# Patient Record
Sex: Female | Born: 1950 | Race: White | Hispanic: No | Marital: Married | Smoking: Never smoker
Health system: Southern US, Community
[De-identification: ages and names within clinical notes are randomized; demographics above are authoritative.]

## PROBLEM LIST (undated history)

## (undated) DIAGNOSIS — J45909 Unspecified asthma, uncomplicated: Secondary | ICD-10-CM

## (undated) HISTORY — PX: DENTAL SURGERY: SHX609

---

## 2015-01-28 ENCOUNTER — Telehealth (INDEPENDENT_AMBULATORY_CARE_PROVIDER_SITE_OTHER): Payer: Self-pay | Admitting: *Deleted

## 2015-01-28 NOTE — Telephone Encounter (Signed)
error 

## 2015-02-20 ENCOUNTER — Ambulatory Visit (INDEPENDENT_AMBULATORY_CARE_PROVIDER_SITE_OTHER): Payer: Medicare Other | Admitting: Orthopedic Surgery

## 2015-02-20 ENCOUNTER — Ambulatory Visit (INDEPENDENT_AMBULATORY_CARE_PROVIDER_SITE_OTHER): Payer: Medicare Other

## 2015-02-20 VITALS — BP 153/83 | Ht 62.0 in | Wt 207.0 lb

## 2015-02-20 DIAGNOSIS — M1712 Unilateral primary osteoarthritis, left knee: Secondary | ICD-10-CM

## 2015-02-20 DIAGNOSIS — M7150 Other bursitis, not elsewhere classified, unspecified site: Secondary | ICD-10-CM

## 2015-02-20 DIAGNOSIS — M25562 Pain in left knee: Secondary | ICD-10-CM

## 2015-02-20 DIAGNOSIS — M7050 Other bursitis of knee, unspecified knee: Secondary | ICD-10-CM

## 2015-02-20 MED ORDER — DICLOFENAC POTASSIUM 50 MG PO TABS: 50.0000 mg | ORAL_TABLET | Freq: Two times a day (BID) | ORAL | Status: DC

## 2015-02-22 NOTE — Progress Notes (Signed)
New patient referred by Dr. W[facility] in [place] [facility] Complaint  Patient presents with  . Knee Pain    Left knee pain, no injury.     History the patient complains of pain in her left knee for several years worse over the last 3 months. She denies trauma. Symptoms pain swelling stiffness Pain described as sharp Timing constant Intensity 8 Testing none Treatment none Medication none Symptoms are improved by sitting in a hot tub of water Symptoms are worse first if standing for long periods of time  Review of systems self-reported by the patient neurologic symptoms she reports weakness allergy seasonal allergies musculoskeletal joint pain swollen joints 14 point system review revealed no abnormalities  Medical history none surgical history none medications none  Family history asthma emphysema alcoholism osteoporosis Allergies contrast dye Social history she does not smoke or drink and she is retired  Physical examination findings BP 153/83 mmHg  Ht 5\' 2"  (1.575 m)  Wt 207 lb (93.895 kg)  BMI 37.85 kg/m2 Appearance patient has mesomorphic body habitus but otherwise grooming hygiene normal no gross deformities  Wanted 3  Mood affect normal  Gait normal  Right knee tenderness and swelling over the bursitis which we diagnosed and palpated over the right has bursal tendons. No joint effusion. Flexion 115. Stability normal. Motor exam normal. Provocative tests for meniscal tear normal.  Left knee she has a flexion contracture 20 active and passive she has tenderness over the past bursal tendons as well. Swelling there. Medial joint line tenderness. McMurray sign negative. Stability and strength is normal.  Both lower extremities normal skin. Normal sensation. Normal pulses.  I ordered x-rays of her knee and we see that she has osteoarthritis especially on the medial compartment and it is greater than 50% loss of joint space approximate 75% joint loss of joint  space with varus and external rotation deformity  The patient has not been treated with nonoperative therapies to this point so we recommend weight loss, exercise, diclofenac 50 mg twice a day #62 refills    Recommend [facility] blend #2 with diclofenac 3% lidocaine 5% menthol 1% and baclofen 2%  Follow-up 3 months  Strong chance she will need knee replacement and this was discussed

## 2015-05-27 ENCOUNTER — Ambulatory Visit: Payer: Medicare Other | Admitting: Orthopedic Surgery

## 2015-08-26 ENCOUNTER — Ambulatory Visit (INDEPENDENT_AMBULATORY_CARE_PROVIDER_SITE_OTHER): Payer: Medicare Other | Admitting: Orthopedic Surgery

## 2015-08-26 VITALS — BP 154/84 | Ht 59.0 in | Wt 209.0 lb

## 2015-08-26 DIAGNOSIS — M1712 Unilateral primary osteoarthritis, left knee: Secondary | ICD-10-CM

## 2015-08-26 MED ORDER — DICLOFENAC POTASSIUM 50 MG PO TABS: 50.0000 mg | ORAL_TABLET | Freq: Two times a day (BID) | ORAL | Status: DC

## 2015-08-26 NOTE — Progress Notes (Signed)
Chief Complaint  Patient presents with  . Follow-up    3 month follow up left knee, discuss surgery    This is a 3 month follow-up visit patient has a history of osteoarthritis of the knee with pretty severe varus alignment. We treated her with weight loss exercise and diclofenac 50 mg twice a day but she gained about 7 or 8 pounds since I saw her in April. Review of systems denies catching locking giving way just the weight gain. She has mesomorphic body habitus with normal grooming and hygiene she is oriented to person place and time mood and affect are normal she has tenderness over the medial joint line of the knee with varus alignment her knee flexion on the left side ranges approximately 115 flexion contracture little bit better. McMurray sign negative muscle strength and tone normal neurovascular exam intact  Recommend refill diclofenac 50 twice a day that seem to be working until it ran out. She'll follow-up in March after she tries diet change weight loss exercise.  Encounter Diagnosis  Name Primary?  . Primary osteoarthritis of knee, left Yes

## 2015-12-10 ENCOUNTER — Encounter: Payer: Self-pay | Admitting: *Deleted

## 2015-12-25 ENCOUNTER — Ambulatory Visit: Payer: Medicare Other | Admitting: Orthopedic Surgery

## 2016-01-27 ENCOUNTER — Ambulatory Visit (INDEPENDENT_AMBULATORY_CARE_PROVIDER_SITE_OTHER): Payer: Medicare Other | Admitting: Internal Medicine

## 2016-02-09 ENCOUNTER — Ambulatory Visit: Payer: Medicare Other | Admitting: Orthopedic Surgery

## 2016-03-24 DIAGNOSIS — B351 Tinea unguium: Secondary | ICD-10-CM | POA: Diagnosis not present

## 2016-03-24 DIAGNOSIS — L820 Inflamed seborrheic keratosis: Secondary | ICD-10-CM | POA: Diagnosis not present

## 2016-03-24 DIAGNOSIS — D225 Melanocytic nevi of trunk: Secondary | ICD-10-CM | POA: Diagnosis not present

## 2016-03-24 DIAGNOSIS — Z1283 Encounter for screening for malignant neoplasm of skin: Secondary | ICD-10-CM | POA: Diagnosis not present

## 2016-04-07 ENCOUNTER — Ambulatory Visit: Payer: Medicare Other | Admitting: Orthopedic Surgery

## 2016-05-04 ENCOUNTER — Encounter: Payer: Self-pay | Admitting: Obstetrics & Gynecology

## 2016-05-04 ENCOUNTER — Ambulatory Visit (INDEPENDENT_AMBULATORY_CARE_PROVIDER_SITE_OTHER): Payer: Medicare Other | Admitting: Obstetrics & Gynecology

## 2016-05-04 ENCOUNTER — Other Ambulatory Visit (HOSPITAL_COMMUNITY)
Admission: RE | Admit: 2016-05-04 | Discharge: 2016-05-04 | Disposition: A | Discharge status: Home / Self Care | Payer: Medicare Other | Source: Ambulatory Visit | Attending: Obstetrics & Gynecology | Admitting: Obstetrics & Gynecology

## 2016-05-04 VITALS — BP 110/70 | HR 76 | Ht 62.0 in | Wt 206.0 lb

## 2016-05-04 DIAGNOSIS — Z124 Encounter for screening for malignant neoplasm of cervix: Secondary | ICD-10-CM

## 2016-05-04 DIAGNOSIS — Z1212 Encounter for screening for malignant neoplasm of rectum: Secondary | ICD-10-CM

## 2016-05-04 DIAGNOSIS — Z1211 Encounter for screening for malignant neoplasm of colon: Secondary | ICD-10-CM

## 2016-05-04 DIAGNOSIS — Z01419 Encounter for gynecological examination (general) (routine) without abnormal findings: Secondary | ICD-10-CM

## 2016-05-04 NOTE — Progress Notes (Signed)
Patient ID: Kelly Fox, female   DOB: 02-11-1951, 65 y.o.   MRN: [id number] Subjective:     Kelly Fox is a 65 y.o. female here for a routine exam.  No LMP recorded. Patient is postmenopausal. No obstetric history on file. Birth Control Method:  none Menstrual Calendar(currently): none  Current complaints: none.   Current acute medical issues:  none   Recent Gynecologic History No LMP recorded. Patient is postmenopausal. Last Pap: 18 years ago,  normal Last mammogram: ,    History reviewed. No pertinent past medical history.  History reviewed. No pertinent past surgical history.  OB History    No data available      Social History   Social History  . Marital Status: Married    Spouse Name: N/A  . Number of Children: N/A  . Years of Education: N/A   Social History Main Topics  . Smoking status: Never Smoker   . Smokeless tobacco: None  . Alcohol Use: None  . Drug Use: None  . Sexual Activity: Not Asked   Other Topics Concern  . None   Social History Narrative  . None    History reviewed. No pertinent family history.  No current outpatient prescriptions on file.  Review of Systems  Review of Systems  Constitutional: Negative for fever, chills, weight loss, malaise/fatigue and diaphoresis.  HENT: Negative for hearing loss, ear pain, nosebleeds, congestion, sore throat, neck pain, tinnitus and ear discharge.   Eyes: Negative for blurred vision, double vision, photophobia, pain, discharge and redness.  Respiratory: Negative for cough, hemoptysis, sputum production, shortness of breath, wheezing and stridor.   Cardiovascular: Negative for chest pain, palpitations, orthopnea, claudication, leg swelling and PND.  Gastrointestinal: negative for abdominal pain. Negative for heartburn, nausea, vomiting, diarrhea, constipation, blood in stool and melena.  Genitourinary: Negative for dysuria, urgency, frequency, hematuria and flank pain.  Musculoskeletal: Negative  for myalgias, back pain, joint pain and falls.  Skin: Negative for itching and rash.  Neurological: Negative for dizziness, tingling, tremors, sensory change, speech change, focal weakness, seizures, loss of consciousness, weakness and headaches.  Endo/Heme/Allergies: Negative for environmental allergies and polydipsia. Does not bruise/bleed easily.  Psychiatric/Behavioral: Negative for depression, suicidal ideas, hallucinations, memory loss and substance abuse. The patient is not nervous/anxious and does not have insomnia.        Objective:  Blood pressure 110/70, pulse 76, height 5\' 2"  (1.575 m), weight 206 lb (93.441 kg).   Physical Exam  Vitals reviewed. Constitutional: She is oriented to person, place, and time. She appears well-developed and well-nourished.  HENT:  Head: Normocephalic and atraumatic.        Right Ear: External ear normal.  Left Ear: External ear normal.  Nose: Nose normal.  Mouth/Throat: Oropharynx is clear and moist.  Eyes: Conjunctivae and EOM are normal. Pupils are equal, round, and reactive to light. Right eye exhibits no discharge. Left eye exhibits no discharge. No scleral icterus.  Neck: Normal range of motion. Neck supple. No tracheal deviation present. No thyromegaly present.  Cardiovascular: Normal rate, regular rhythm, normal heart sounds and intact distal pulses.  Exam reveals no gallop and no friction rub.   No murmur heard. Respiratory: Effort normal and breath sounds normal. No respiratory distress. She has no wheezes. She has no rales. She exhibits no tenderness.  GI: Soft. Bowel sounds are normal. She exhibits no distension and no mass. There is no tenderness. There is no rebound and no guarding.  Genitourinary:  Breasts no masses skin changes  or nipple changes bilaterally      Vulva is normal without lesions Vagina is pink moist without discharge Cervix normal in appearance and pap is done Uterus is normal size shape and contour Adnexa is  negative with normal sized ovaries  {Rectal    hemoccult negative, normal tone, no masses  Musculoskeletal: Normal range of motion. She exhibits no edema and no tenderness.  Neurological: She is alert and oriented to person, place, and time. She has normal reflexes. She displays normal reflexes. No cranial nerve deficit. She exhibits normal muscle tone. Coordination normal.  Skin: Skin is warm and dry. No rash noted. No erythema. No pallor.  Psychiatric: She has a normal mood and affect. Her behavior is normal. Judgment and thought content normal.       Medications Ordered at today's visit: No orders of the defined types were placed in this encounter.    Other orders placed at today's visit: No orders of the defined types were placed in this encounter.      Assessment:    Healthy female exam.    Plan:    Mammogram ordered. Follow up in: 2 years.     Return in about 2 years (around 05/04/2018) for yearly, with Dr Elonda Husky.

## 2016-05-06 LAB — CYTOLOGY - PAP

## 2016-05-12 DIAGNOSIS — L02512 Cutaneous abscess of left hand: Secondary | ICD-10-CM | POA: Diagnosis not present

## 2016-05-12 DIAGNOSIS — L0390 Cellulitis, unspecified: Secondary | ICD-10-CM | POA: Diagnosis not present

## 2016-05-18 DIAGNOSIS — L02512 Cutaneous abscess of left hand: Secondary | ICD-10-CM | POA: Diagnosis not present

## 2016-05-18 DIAGNOSIS — I10 Essential (primary) hypertension: Secondary | ICD-10-CM | POA: Diagnosis not present

## 2016-05-28 DIAGNOSIS — E782 Mixed hyperlipidemia: Secondary | ICD-10-CM | POA: Diagnosis not present

## 2016-06-01 DIAGNOSIS — Z7251 High risk heterosexual behavior: Secondary | ICD-10-CM | POA: Diagnosis not present

## 2016-06-01 DIAGNOSIS — L02512 Cutaneous abscess of left hand: Secondary | ICD-10-CM | POA: Diagnosis not present

## 2016-06-02 ENCOUNTER — Ambulatory Visit (INDEPENDENT_AMBULATORY_CARE_PROVIDER_SITE_OTHER): Payer: Medicare Other

## 2016-06-02 ENCOUNTER — Encounter: Payer: Self-pay | Admitting: Orthopaedic Surgery

## 2016-06-02 ENCOUNTER — Ambulatory Visit (INDEPENDENT_AMBULATORY_CARE_PROVIDER_SITE_OTHER): Payer: Medicare Other | Admitting: Orthopaedic Surgery

## 2016-06-02 VITALS — BP 145/90 | HR 79 | Temp 97.9°F | Ht 60.0 in | Wt 205.6 lb

## 2016-06-02 DIAGNOSIS — M79645 Pain in left finger(s): Secondary | ICD-10-CM

## 2016-06-02 DIAGNOSIS — L089 Local infection of the skin and subcutaneous tissue, unspecified: Secondary | ICD-10-CM | POA: Diagnosis not present

## 2016-06-02 MED ORDER — DOXYCYCLINE HYCLATE 50 MG PO CAPS: ORAL_CAPSULE | ORAL | 1 refills | Status: DC

## 2016-06-02 NOTE — Progress Notes (Signed)
Subjective:  I have had an infection of my finger    Patient ID: Kelly Fox, female    DOB: [birth year], 65 y.o.   MRN: [id number]  HPI She had swelling and redness of the left long finger that was painful.  It happened after she had been out on their boat.  She went to [facility] in town here and was evaluated on July 14  She says the finger was lanced and she was begun on Septra DS which she took for two weeks.  I do not have any copies of those notes.  Then she went to see Dr. Merlyn Albert and was given doxycycline for the finger and took it for seven days.  She was referred here.  She had initially swelling of the left long finger and it had red streaks up the hand past the wrist to the elbow area.  That resolved in a day or two after the lancing of the finger.  Now she has just swelling of the finger to the PIP joint.  It is not as red as it was and has no drainage or fluctuance.  She is concerned it is still somewhat swollen and has a very slight reddish appearance.  She has no fever or chills.  Recent CBC was within normal ranges.   Review of Systems  HENT: Negative for congestion.   Respiratory: Negative for cough and shortness of breath.   Cardiovascular: Negative for chest pain and leg swelling.  Endocrine: Positive for cold intolerance.  Musculoskeletal: Positive for arthralgias.  Allergic/Immunologic: Positive for environmental allergies.   History reviewed. No pertinent past medical history.  History reviewed. No pertinent surgical history.  No current outpatient prescriptions on file prior to visit.   No current facility-administered medications on file prior to visit.     Social History   Social History  . Marital status: Married    Spouse name: N/A  . Number of children: N/A  . Years of education: N/A   Occupational History  . Not on file.   Social History Main Topics  . Smoking status: Never Smoker  . Smokeless tobacco: Never Used  . Alcohol  use Not on file  . Drug use: Unknown  . Sexual activity: Not on file   Other Topics Concern  . Not on file   Social History Narrative  . No narrative on file    History of hypertension and heart disease in the family.History also of COPD and osteoporosis.  BP (!) 145/90   Pulse 79   Temp 97.9 F (36.6 C)   Ht 5' (1.524 m)   Wt 205 lb 9.6 oz (93.3 kg)   BMI 40.15 kg/m      Objective:   Physical Exam  Constitutional: She is oriented to person, place, and time. She appears well-developed and well-nourished.  HENT:  Head: Normocephalic and atraumatic.  Eyes: Conjunctivae and EOM are normal. Pupils are equal, round, and reactive to light.  Neck: Normal range of motion. Neck supple.  Cardiovascular: Normal rate, regular rhythm and intact distal pulses.   Pulmonary/Chest: Effort normal.  Abdominal: Soft.  Musculoskeletal: She exhibits tenderness (The left long finger has dorsal swelling from the nail bed to the PIP joint, slightly red, no fluctuance, no drainage, ROM full, stable,  Rest of hand and the right hand normal.).  Neurological: She is alert and oriented to person, place, and time. She displays normal reflexes. No cranial nerve deficit. She exhibits normal muscle tone. Coordination  normal.  Skin: Skin is warm and dry.  Psychiatric: She has a normal mood and affect. Her behavior is normal. Judgment and thought content normal.    X-rays were done and reported separately.     Assessment & Plan:   Encounter Diagnoses  Name Primary?  . Finger pain, left Yes  . Finger infection    I will have her resume doxycyline for another ten days.  I will see her back in one week.  Call if any problem.  Precautions discussed.  Electronically Signed Sanjuana Kava, MD 8/9/201710:31 AM

## 2016-06-09 ENCOUNTER — Encounter: Payer: Self-pay | Admitting: Orthopaedic Surgery

## 2016-06-09 ENCOUNTER — Ambulatory Visit (INDEPENDENT_AMBULATORY_CARE_PROVIDER_SITE_OTHER): Payer: Medicare Other | Admitting: Orthopaedic Surgery

## 2016-06-09 VITALS — BP 149/83 | HR 83 | Temp 97.9°F | Ht 60.5 in | Wt 206.0 lb

## 2016-06-09 DIAGNOSIS — L089 Local infection of the skin and subcutaneous tissue, unspecified: Secondary | ICD-10-CM

## 2016-06-09 DIAGNOSIS — M79645 Pain in left finger(s): Secondary | ICD-10-CM

## 2016-06-09 MED ORDER — DOXYCYCLINE HYCLATE 50 MG PO CAPS: ORAL_CAPSULE | ORAL | 1 refills | Status: DC

## 2016-06-09 NOTE — Progress Notes (Signed)
Patient [id number] [name], female DOB:09-13-1951, 65 y.o. [id number]  Chief Complaint  Patient presents with  . Follow-up    left middle finger     HPI  Kelly Fox is a 65 y.o. female who has had redness and swelling of the left long finger near the DIP joint.  She has swelling still but it is less and it is less red.  She has been taking the doxycyline and I want her to continue this.  She has no new trauma.  She is moving the finger more. HPI  Body mass index is 39.57 kg/m.  ROS  Review of Systems  HENT: Negative for congestion.   Respiratory: Negative for cough and shortness of breath.   Cardiovascular: Negative for chest pain and leg swelling.  Endocrine: Positive for cold intolerance.  Musculoskeletal: Positive for arthralgias.  Allergic/Immunologic: Positive for environmental allergies.    No past medical history on file.  No past surgical history on file.  No family history on file.  Social History Social History  Substance Use Topics  . Smoking status: Never Smoker  . Smokeless tobacco: Never Used  . Alcohol use Not on file    Allergies  Allergen Reactions  . Contrast Media [Iodinated Diagnostic Agents]     Current Outpatient Prescriptions  Medication Sig Dispense Refill  . doxycycline (VIBRAMYCIN) 50 MG capsule Take two capsules twice a day.  Do not drink milk before or after taking. 40 capsule 1   No current facility-administered medications for this visit.      Physical Exam  Blood pressure (!) 149/83, pulse 83, temperature 97.9 F (36.6 C), height 5' 0.5" (1.537 m), weight 206 lb (93.4 kg).  Constitutional: overall normal hygiene, normal nutrition, well developed, normal grooming, normal body habitus. Assistive device:none  Musculoskeletal: gait and station Limp none, muscle tone and strength are normal, no tremors or atrophy is present.  .  Neurological: coordination overall normal.  Deep tendon reflex/nerve stretch intact.  Sensation  normal.  Cranial nerves II-XII intact.   Skin:   normal overall no scars, lesions, ulcers or rashes. No psoriasis.  Psychiatric: Alert and oriented x 3.  Recent memory intact, remote memory unclear.  Normal mood and affect. Well groomed.  Good eye contact.  Cardiovascular: overall no swelling, no varicosities, no edema bilaterally, normal temperatures of the legs and arms, no clubbing, cyanosis and good capillary refill.  Lymphatic: palpation is normal.  The left long finger has swelling from the PIP to the nail.  It is more dorsal.  There is slight redness but much less than before and the swelling is much less.  Motion at DIP is present today.  Sensation is intact.  There is no drainage or fluctuance.  She has much less pain to no pain.  The patient has been educated about the nature of the problem(s) and counseled on treatment options.  The patient appeared to understand what I have discussed and is in agreement with it.  Encounter Diagnoses  Name Primary?  . Finger pain, left Yes  . Finger infection     PLAN Call if any problems.  Precautions discussed.  Continue current medications.   Return to clinic 1 week   I renewed antibiotic.  Electronically Signed Sanjuana Kava, MD 8/16/20178:37 AM

## 2016-06-16 ENCOUNTER — Encounter: Payer: Self-pay | Admitting: Orthopaedic Surgery

## 2016-06-16 ENCOUNTER — Ambulatory Visit (INDEPENDENT_AMBULATORY_CARE_PROVIDER_SITE_OTHER): Payer: Medicare Other | Admitting: Orthopaedic Surgery

## 2016-06-16 VITALS — BP 141/76 | HR 73 | Temp 97.7°F | Ht 60.0 in | Wt 204.0 lb

## 2016-06-16 DIAGNOSIS — M79645 Pain in left finger(s): Secondary | ICD-10-CM

## 2016-06-16 NOTE — Progress Notes (Signed)
Patient [id number] Kelly Fox, female DOB:11-01-1950, 65 y.o. [id number]  Chief Complaint  Patient presents with  . Follow-up    left long finger swelling    HPI  Kelly Fox is a 65 y.o. female who has continued swelling of the dorsal DIP joint of the left long finger.  It is not red now, it is not tender but still has residual swelling.  She is completing the last course of doxycycline.  She is using it but still concerned about the residual swelling.  I will have hand surgeon see her for further evaluation.  HPI  Body mass index is 39.84 kg/m.  ROS  Review of Systems  HENT: Negative for congestion.   Respiratory: Negative for cough and shortness of breath.   Cardiovascular: Negative for chest pain and leg swelling.  Endocrine: Positive for cold intolerance.  Musculoskeletal: Positive for arthralgias.  Allergic/Immunologic: Positive for environmental allergies.    No past medical history on file.  No past surgical history on file.  No family history on file.  Social History Social History  Substance Use Topics  . Smoking status: Never Smoker  . Smokeless tobacco: Never Used  . Alcohol use Not on file    Allergies  Allergen Reactions  . Contrast Media [Iodinated Diagnostic Agents]     Current Outpatient Prescriptions  Medication Sig Dispense Refill  . doxycycline (VIBRAMYCIN) 50 MG capsule Take two capsules twice a day.  Do not drink milk before or after taking. 40 capsule 1   No current facility-administered medications for this visit.      Physical Exam  Blood pressure (!) 141/76, pulse 73, temperature 97.7 F (36.5 C), height 5' (1.524 m), weight 204 lb (92.5 kg).  Constitutional: overall normal hygiene, normal nutrition, well developed, normal grooming, normal body habitus. Assistive device:none  Musculoskeletal: gait and station Limp none, muscle tone and strength are normal, no tremors or atrophy is present.  .  Neurological: coordination  overall normal.  Deep tendon reflex/nerve stretch intact.  Sensation normal.  Cranial nerves II-XII intact.   Skin:   normal overall no scars, lesions, ulcers or rashes. No psoriasis.  Psychiatric: Alert and oriented x 3.  Recent memory intact, remote memory unclear.  Normal mood and affect. Well groomed.  Good eye contact.  Cardiovascular: overall no swelling, no varicosities, no edema bilaterally, normal temperatures of the legs and arms, no clubbing, cyanosis and good capillary refill.  Lymphatic: palpation is normal.  She has swelling over the dorsum of the DIP joint of the left long finger.  It is not red.  There is only very slight tenderness.  Nail looks OK.  There is no discharge or fluctuance.  Rest of fingers negative, right hand negative.  The patient has been educated about the nature of the problem(s) and counseled on treatment options.  The patient appeared to understand what I have discussed and is in agreement with it.  Encounter Diagnosis  Name Primary?  . Finger pain, left Yes    PLAN Call if any problems.  Precautions discussed.  Continue current medications.   Return to clinic to see hand surgeon   Electronically Signed Sanjuana Kava, MD 8/23/20178:47 AM

## 2016-06-23 DIAGNOSIS — M79645 Pain in left finger(s): Secondary | ICD-10-CM | POA: Diagnosis not present

## 2016-06-23 DIAGNOSIS — M1A0420 Idiopathic chronic gout, left hand, without tophus (tophi): Secondary | ICD-10-CM | POA: Diagnosis not present

## 2016-06-25 DIAGNOSIS — M1A0420 Idiopathic chronic gout, left hand, without tophus (tophi): Secondary | ICD-10-CM | POA: Diagnosis not present

## 2016-06-30 DIAGNOSIS — M11242 Other chondrocalcinosis, left hand: Secondary | ICD-10-CM | POA: Diagnosis not present

## 2016-07-01 ENCOUNTER — Other Ambulatory Visit (INDEPENDENT_AMBULATORY_CARE_PROVIDER_SITE_OTHER): Payer: Self-pay | Admitting: Internal Medicine

## 2016-07-01 DIAGNOSIS — K7682 Hepatic encephalopathy: Secondary | ICD-10-CM

## 2016-07-01 DIAGNOSIS — K7290 Hepatic failure, unspecified without coma: Secondary | ICD-10-CM

## 2016-09-10 DIAGNOSIS — Z6837 Body mass index (BMI) 37.0-37.9, adult: Secondary | ICD-10-CM | POA: Diagnosis not present

## 2016-09-10 DIAGNOSIS — K120 Recurrent oral aphthae: Secondary | ICD-10-CM | POA: Diagnosis not present

## 2016-10-09 DIAGNOSIS — M545 Low back pain: Secondary | ICD-10-CM | POA: Diagnosis not present

## 2016-10-09 DIAGNOSIS — M62830 Muscle spasm of back: Secondary | ICD-10-CM | POA: Diagnosis not present

## 2016-10-22 DIAGNOSIS — M79671 Pain in right foot: Secondary | ICD-10-CM | POA: Diagnosis not present

## 2016-10-22 DIAGNOSIS — M7741 Metatarsalgia, right foot: Secondary | ICD-10-CM | POA: Diagnosis not present

## 2016-10-22 DIAGNOSIS — B351 Tinea unguium: Secondary | ICD-10-CM | POA: Diagnosis not present

## 2016-10-22 DIAGNOSIS — L851 Acquired keratosis [keratoderma] palmaris et plantaris: Secondary | ICD-10-CM | POA: Diagnosis not present

## 2017-05-07 DIAGNOSIS — B029 Zoster without complications: Secondary | ICD-10-CM | POA: Diagnosis not present

## 2017-06-09 DIAGNOSIS — Z6835 Body mass index (BMI) 35.0-35.9, adult: Secondary | ICD-10-CM | POA: Diagnosis not present

## 2017-06-09 DIAGNOSIS — B029 Zoster without complications: Secondary | ICD-10-CM | POA: Diagnosis not present

## 2017-10-06 DIAGNOSIS — B029 Zoster without complications: Secondary | ICD-10-CM | POA: Diagnosis not present

## 2017-12-13 DIAGNOSIS — M170 Bilateral primary osteoarthritis of knee: Secondary | ICD-10-CM | POA: Diagnosis not present

## 2017-12-13 DIAGNOSIS — M1712 Unilateral primary osteoarthritis, left knee: Secondary | ICD-10-CM | POA: Diagnosis not present

## 2017-12-13 DIAGNOSIS — M25562 Pain in left knee: Secondary | ICD-10-CM | POA: Diagnosis not present

## 2017-12-13 DIAGNOSIS — M25561 Pain in right knee: Secondary | ICD-10-CM | POA: Diagnosis not present

## 2017-12-21 DIAGNOSIS — M1712 Unilateral primary osteoarthritis, left knee: Secondary | ICD-10-CM | POA: Diagnosis not present

## 2017-12-21 DIAGNOSIS — M25562 Pain in left knee: Secondary | ICD-10-CM | POA: Diagnosis not present

## 2017-12-27 DIAGNOSIS — M25562 Pain in left knee: Secondary | ICD-10-CM | POA: Diagnosis not present

## 2017-12-27 DIAGNOSIS — M1712 Unilateral primary osteoarthritis, left knee: Secondary | ICD-10-CM | POA: Diagnosis not present

## 2017-12-27 DIAGNOSIS — B351 Tinea unguium: Secondary | ICD-10-CM | POA: Diagnosis not present

## 2017-12-27 DIAGNOSIS — L851 Acquired keratosis [keratoderma] palmaris et plantaris: Secondary | ICD-10-CM | POA: Diagnosis not present

## 2018-01-05 DIAGNOSIS — M25562 Pain in left knee: Secondary | ICD-10-CM | POA: Diagnosis not present

## 2018-01-05 DIAGNOSIS — M1712 Unilateral primary osteoarthritis, left knee: Secondary | ICD-10-CM | POA: Diagnosis not present

## 2018-01-19 DIAGNOSIS — M25562 Pain in left knee: Secondary | ICD-10-CM | POA: Diagnosis not present

## 2018-01-19 DIAGNOSIS — M1712 Unilateral primary osteoarthritis, left knee: Secondary | ICD-10-CM | POA: Diagnosis not present

## 2018-04-25 DIAGNOSIS — B351 Tinea unguium: Secondary | ICD-10-CM | POA: Diagnosis not present

## 2018-04-25 DIAGNOSIS — M170 Bilateral primary osteoarthritis of knee: Secondary | ICD-10-CM | POA: Diagnosis not present

## 2018-04-25 DIAGNOSIS — M1712 Unilateral primary osteoarthritis, left knee: Secondary | ICD-10-CM | POA: Diagnosis not present

## 2018-04-25 DIAGNOSIS — M25562 Pain in left knee: Secondary | ICD-10-CM | POA: Diagnosis not present

## 2018-05-02 DIAGNOSIS — M1712 Unilateral primary osteoarthritis, left knee: Secondary | ICD-10-CM | POA: Diagnosis not present

## 2018-05-02 DIAGNOSIS — M25562 Pain in left knee: Secondary | ICD-10-CM | POA: Diagnosis not present

## 2018-05-16 DIAGNOSIS — M25562 Pain in left knee: Secondary | ICD-10-CM | POA: Diagnosis not present

## 2018-05-16 DIAGNOSIS — M1712 Unilateral primary osteoarthritis, left knee: Secondary | ICD-10-CM | POA: Diagnosis not present

## 2018-05-23 DIAGNOSIS — M1712 Unilateral primary osteoarthritis, left knee: Secondary | ICD-10-CM | POA: Diagnosis not present

## 2018-05-23 DIAGNOSIS — M25562 Pain in left knee: Secondary | ICD-10-CM | POA: Diagnosis not present

## 2018-08-29 ENCOUNTER — Ambulatory Visit (INDEPENDENT_AMBULATORY_CARE_PROVIDER_SITE_OTHER): Payer: Medicare Other

## 2018-08-29 ENCOUNTER — Ambulatory Visit (INDEPENDENT_AMBULATORY_CARE_PROVIDER_SITE_OTHER): Payer: Medicare Other | Admitting: Orthopaedic Surgery

## 2018-08-29 ENCOUNTER — Encounter: Payer: Self-pay | Admitting: Orthopaedic Surgery

## 2018-08-29 VITALS — BP 151/80 | HR 80 | Ht 60.0 in | Wt 207.0 lb

## 2018-08-29 DIAGNOSIS — M25572 Pain in left ankle and joints of left foot: Secondary | ICD-10-CM | POA: Diagnosis not present

## 2018-08-29 NOTE — Progress Notes (Signed)
Patient PX:Kelly Fox, female DOB:[birth year], 67 y.o. [id number]  Chief Complaint  Patient presents with  . Foot Pain    Left foot for 6-8 months. No known injury.    HPI  Kelly Fox is a 67 y.o. female who has pain of the left dorsal foot.  She has noticed a "knot" of the foot that has gotten more painful over the last six months.  She has pain with walking on unlevel ground.  She has also been seen at [facility] and has had injections in the left knee in July.  Her knee is no better.  She has no direct trauma, no redness, no swelling of the foot.   Body mass index is 40.43 kg/m.  The patient meets the AMA guidelines for Morbid (severe) obesity with a BMI > 40.0 and I have recommended weight loss.   ROS  Review of Systems  Constitutional: Positive for activity change.  Musculoskeletal: Positive for arthralgias, gait problem and joint swelling.  All other systems reviewed and are negative.   All other systems reviewed and are negative.  The following is a summary of the past history medically, past history surgically, known current medicines, social history and family history.  This information is gathered electronically by the computer from prior information and documentation.  I review this each visit and have found including this information at this point in the chart is beneficial and informative.    No past medical history on file.  No past surgical history on file.  No family history on file.  Social History Social History   Tobacco Use  . Smoking status: Never Smoker  . Smokeless tobacco: Never Used  Substance Use Topics  . Alcohol use: Not on file  . Drug use: Not on file    Allergies  Allergen Reactions  . Contrast Media [Iodinated Diagnostic Agents]     Current Outpatient Medications  Medication Sig Dispense Refill  . doxycycline (VIBRAMYCIN) 50 MG capsule Take two capsules twice a day.  Do not drink milk before or after taking. 40 capsule 1    No current facility-administered medications for this visit.      Physical Exam  Blood pressure (!) 151/80, pulse 80, height 5' (1.524 m), weight 207 lb (93.9 kg).  Constitutional: overall normal hygiene, normal nutrition, well developed, normal grooming, normal body habitus. Assistive device:none  Musculoskeletal: gait and station Limp left, muscle tone and strength are normal, no tremors or atrophy is present.  .  Neurological: coordination overall normal.  Deep tendon reflex/nerve stretch intact.  Sensation normal.  Cranial nerves II-XII intact.   Skin:   Normal overall no scars, lesions, ulcers or rashes. No psoriasis.  Psychiatric: Alert and oriented x 3.  Recent memory intact, remote memory unclear.  Normal mood and affect. Well groomed.  Good eye contact.  Cardiovascular: overall no swelling, no varicosities, no edema bilaterally, normal temperatures of the legs and arms, no clubbing, cyanosis and good capillary refill.  Lymphatic: palpation is normal.  Her mid left dorsal foot has an area of prominence from bossing of the mid tarsals.   She has no redness, no cyst, NV intact.  Limp to the left.    All other systems reviewed and are negative   The patient has been educated about the nature of the problem(s) and counseled on treatment options.  The patient appeared to understand what I have discussed and is in agreement with it.  Encounter Diagnosis  Name Primary?  . Pain of joint  of left ankle and foot Yes  x-rays were done of the left foot, reported separately.   PLAN Call if any problems.  Precautions discussed.  Continue current medications.   Return to clinic 1 month   Use BioFreeze or Aspercreme to the area tid.  Electronically Signed Sanjuana Kava, MD 11/5/20192:48 PM

## 2018-08-31 DIAGNOSIS — M1712 Unilateral primary osteoarthritis, left knee: Secondary | ICD-10-CM | POA: Diagnosis not present

## 2018-08-31 DIAGNOSIS — M25562 Pain in left knee: Secondary | ICD-10-CM | POA: Diagnosis not present

## 2018-09-01 DIAGNOSIS — R062 Wheezing: Secondary | ICD-10-CM | POA: Diagnosis not present

## 2018-09-01 DIAGNOSIS — R030 Elevated blood-pressure reading, without diagnosis of hypertension: Secondary | ICD-10-CM | POA: Diagnosis not present

## 2018-09-01 DIAGNOSIS — J069 Acute upper respiratory infection, unspecified: Secondary | ICD-10-CM | POA: Diagnosis not present

## 2018-09-06 ENCOUNTER — Other Ambulatory Visit (HOSPITAL_COMMUNITY): Payer: Self-pay | Admitting: Internal Medicine

## 2018-09-06 DIAGNOSIS — M1712 Unilateral primary osteoarthritis, left knee: Secondary | ICD-10-CM | POA: Diagnosis not present

## 2018-09-06 DIAGNOSIS — M25562 Pain in left knee: Secondary | ICD-10-CM | POA: Diagnosis not present

## 2018-09-06 DIAGNOSIS — Z1231 Encounter for screening mammogram for malignant neoplasm of breast: Secondary | ICD-10-CM

## 2018-09-19 DIAGNOSIS — M1712 Unilateral primary osteoarthritis, left knee: Secondary | ICD-10-CM | POA: Diagnosis not present

## 2018-09-19 DIAGNOSIS — M25562 Pain in left knee: Secondary | ICD-10-CM | POA: Diagnosis not present

## 2018-09-25 ENCOUNTER — Ambulatory Visit (HOSPITAL_COMMUNITY)
Admission: RE | Admit: 2018-09-25 | Discharge: 2018-09-25 | Disposition: A | Discharge status: Home / Self Care | Payer: Medicare Other | Source: Ambulatory Visit | Attending: Internal Medicine | Admitting: Internal Medicine

## 2018-09-25 ENCOUNTER — Encounter (HOSPITAL_COMMUNITY): Payer: Self-pay

## 2018-09-25 DIAGNOSIS — M1712 Unilateral primary osteoarthritis, left knee: Secondary | ICD-10-CM | POA: Diagnosis not present

## 2018-09-25 DIAGNOSIS — Z1231 Encounter for screening mammogram for malignant neoplasm of breast: Secondary | ICD-10-CM | POA: Diagnosis not present

## 2018-09-25 DIAGNOSIS — M25562 Pain in left knee: Secondary | ICD-10-CM | POA: Diagnosis not present

## 2018-09-26 ENCOUNTER — Encounter: Payer: Self-pay | Admitting: Orthopaedic Surgery

## 2018-09-26 ENCOUNTER — Ambulatory Visit (INDEPENDENT_AMBULATORY_CARE_PROVIDER_SITE_OTHER): Payer: Medicare Other | Admitting: Orthopaedic Surgery

## 2018-09-26 VITALS — BP 169/109 | HR 77 | Ht 60.0 in | Wt 195.0 lb

## 2018-09-26 DIAGNOSIS — M25572 Pain in left ankle and joints of left foot: Secondary | ICD-10-CM

## 2018-09-26 NOTE — Progress Notes (Signed)
Patient PO:Kelly Fox, female DOB:02-28-1951, 67 y.o. TIR:[id number]  Chief Complaint  Patient presents with  . Foot Pain    left    HPI  Kelly Fox is a 67 y.o. female who has left foot pain.  She is somewhat better with less pain but she still has pain at times.  The swelling is decreased.  She has no redness.  She has had a good holiday.  She has one more knee injection from [facility].   Body mass index is 38.08 kg/m.  She is now below BMI 40 and doing well.  ROS  Review of Systems  Constitutional: Positive for activity change.  Musculoskeletal: Positive for arthralgias, gait problem and joint swelling.  All other systems reviewed and are negative.   All other systems reviewed and are negative.  The following is a summary of the past history medically, past history surgically, known current medicines, social history and family history.  This information is gathered electronically by the computer from prior information and documentation.  I review this each visit and have found including this information at this point in the chart is beneficial and informative.    History reviewed. No pertinent past medical history.  History reviewed. No pertinent surgical history.  Family History  Problem Relation Age of Onset  . Breast cancer Sister     Social History Social History   Tobacco Use  . Smoking status: Never Smoker  . Smokeless tobacco: Never Used  Substance Use Topics  . Alcohol use: Not on file  . Drug use: Not on file    Allergies  Allergen Reactions  . Contrast Media [Iodinated Diagnostic Agents]     No current outpatient medications on file.   No current facility-administered medications for this visit.      Physical Exam  Blood pressure (!) 169/109, pulse 77, height 5' (1.524 m), weight 195 lb (88.5 kg).  Constitutional: overall normal hygiene, normal nutrition, well developed, normal grooming, normal body habitus. Assistive  device:none  Musculoskeletal: gait and station Limp none, muscle tone and strength are normal, no tremors or atrophy is present.  .  Neurological: coordination overall normal.  Deep tendon reflex/nerve stretch intact.  Sensation normal.  Cranial nerves II-XII intact.   Skin:   Normal overall no scars, lesions, ulcers or rashes. No psoriasis.  Psychiatric: Alert and oriented x 3.  Recent memory intact, remote memory unclear.  Normal mood and affect. Well groomed.  Good eye contact.  Cardiovascular: overall no swelling, no varicosities, no edema bilaterally, normal temperatures of the legs and arms, no clubbing, cyanosis and good capillary refill.  Lymphatic: palpation is normal.  Her left foot is slightly tender on the dorsum mid foot but no redness or swelling.  Gait is normal.  NV is intact.  All other systems reviewed and are negative   The patient has been educated about the nature of the problem(s) and counseled on treatment options.  The patient appeared to understand what I have discussed and is in agreement with it.  Encounter Diagnosis  Name Primary?  . Pain of joint of left ankle and foot Yes    PLAN Call if any problems.  Precautions discussed.  Continue current medications.   Return to clinic 6 weeks   Electronically [place], MD 12/3/20192:31 PM

## 2018-10-02 DIAGNOSIS — M25562 Pain in left knee: Secondary | ICD-10-CM | POA: Diagnosis not present

## 2018-10-02 DIAGNOSIS — M1712 Unilateral primary osteoarthritis, left knee: Secondary | ICD-10-CM | POA: Diagnosis not present

## 2018-10-10 DIAGNOSIS — Z0000 Encounter for general adult medical examination without abnormal findings: Secondary | ICD-10-CM | POA: Diagnosis not present

## 2018-10-10 DIAGNOSIS — B029 Zoster without complications: Secondary | ICD-10-CM | POA: Diagnosis not present

## 2018-10-30 DIAGNOSIS — Z1212 Encounter for screening for malignant neoplasm of rectum: Secondary | ICD-10-CM | POA: Diagnosis not present

## 2018-10-30 DIAGNOSIS — Z1211 Encounter for screening for malignant neoplasm of colon: Secondary | ICD-10-CM | POA: Diagnosis not present

## 2018-10-31 DIAGNOSIS — B351 Tinea unguium: Secondary | ICD-10-CM | POA: Diagnosis not present

## 2018-11-07 ENCOUNTER — Ambulatory Visit: Payer: Medicare Other | Admitting: Orthopaedic Surgery

## 2019-03-21 IMAGING — MG DIGITAL SCREENING BILATERAL MAMMOGRAM WITH TOMO AND CAD
8 series · 9 of 24 positions shown · non-contrast
Comparison: None.

CLINICAL DATA: Screening.

EXAM:
DIGITAL SCREENING BILATERAL MAMMOGRAM WITH TOMO AND CAD

[R CC synth-2D]
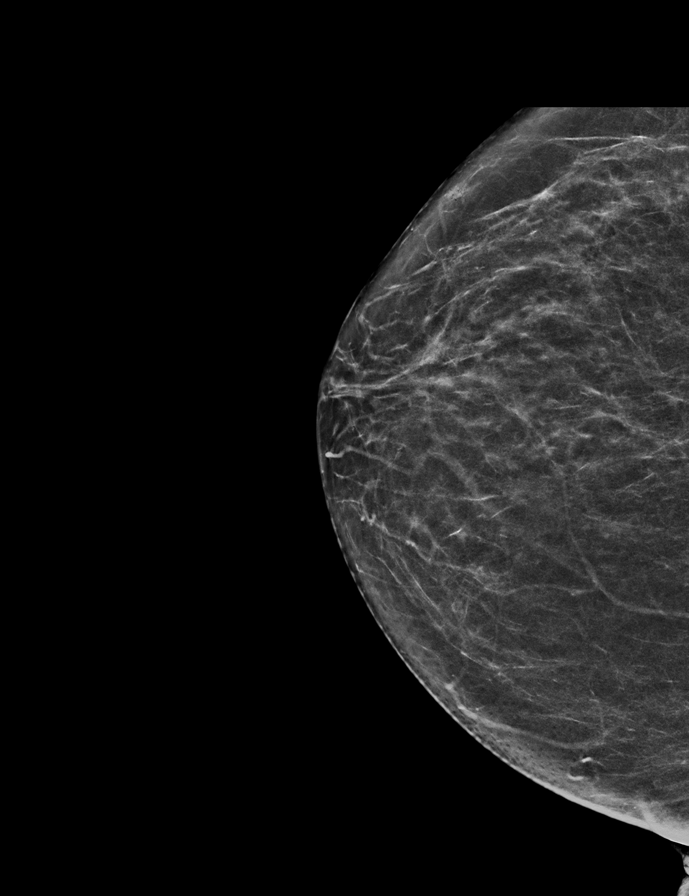

[L MLO synth-2D]
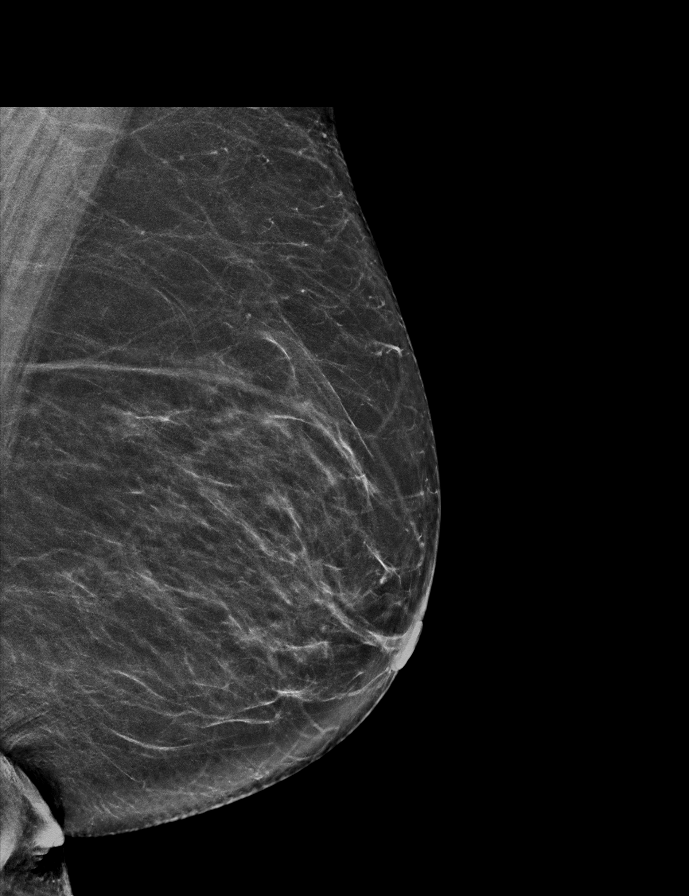

[L CC synth-2D]
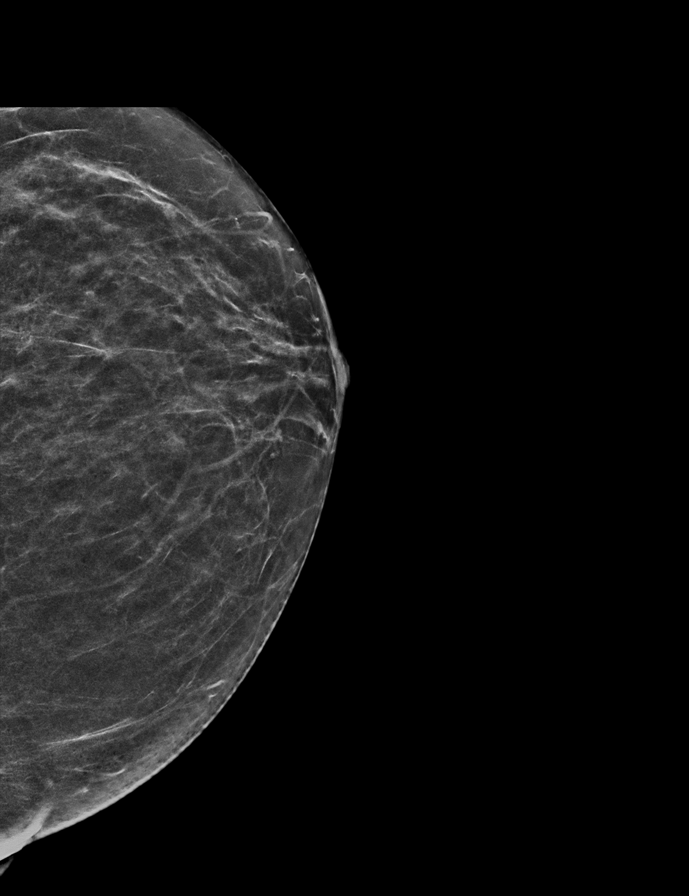

[R MLO synth-2D]
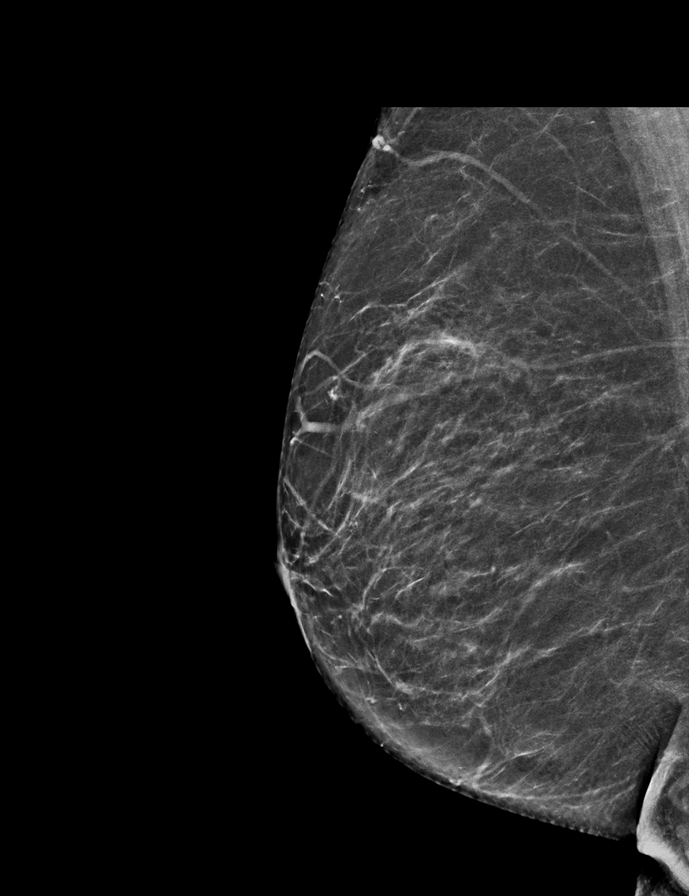

[R MLO tomo · 2 of 62 frames shown]
[frame 21/62]
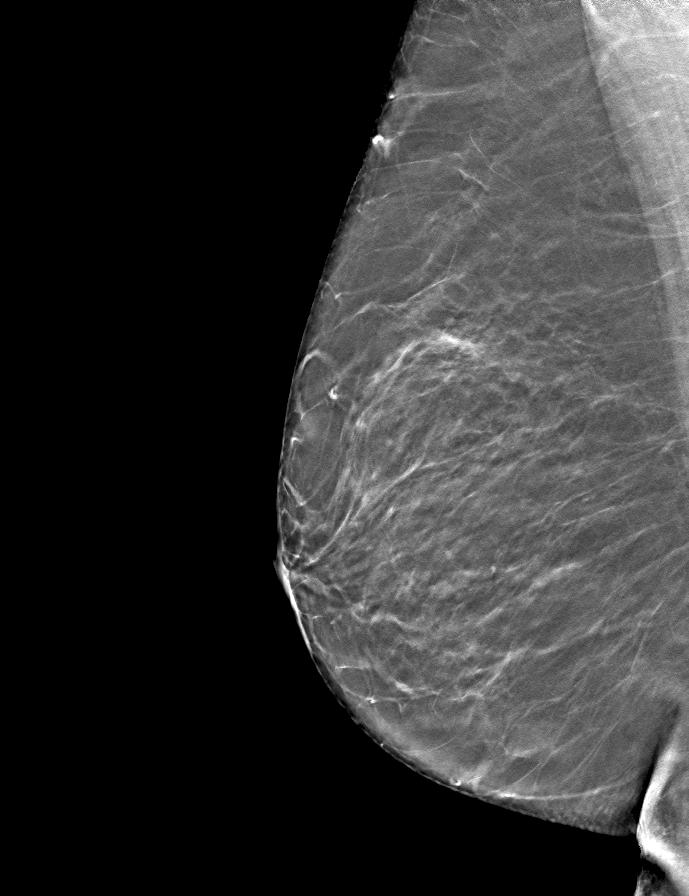
[frame 31/62]
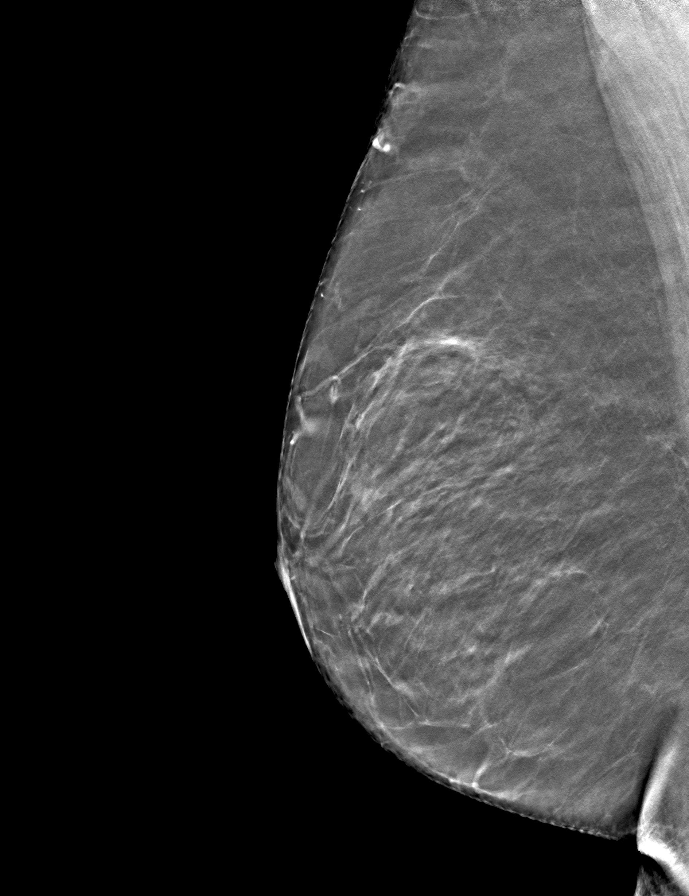

[L CC tomo · tomo slice 29/57.0]
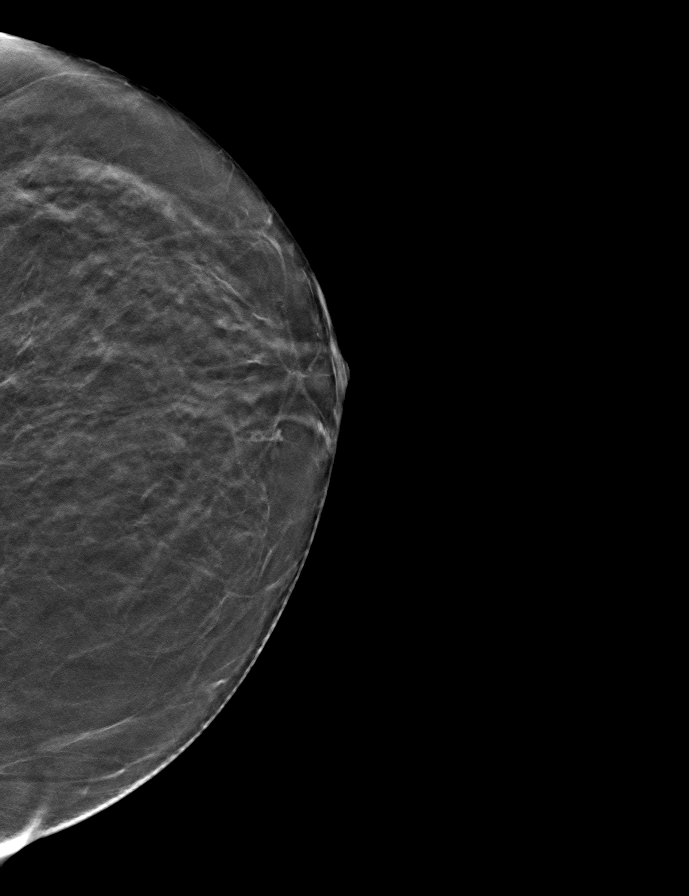

[R CC tomo · tomo slice 31/60.0]
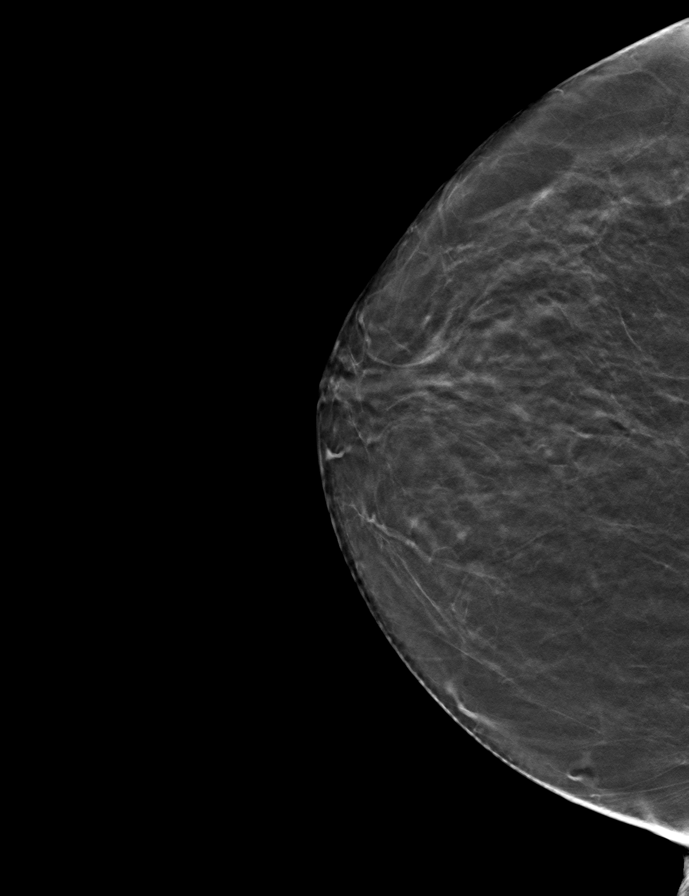

[L MLO tomo · tomo slice 35/68.0]
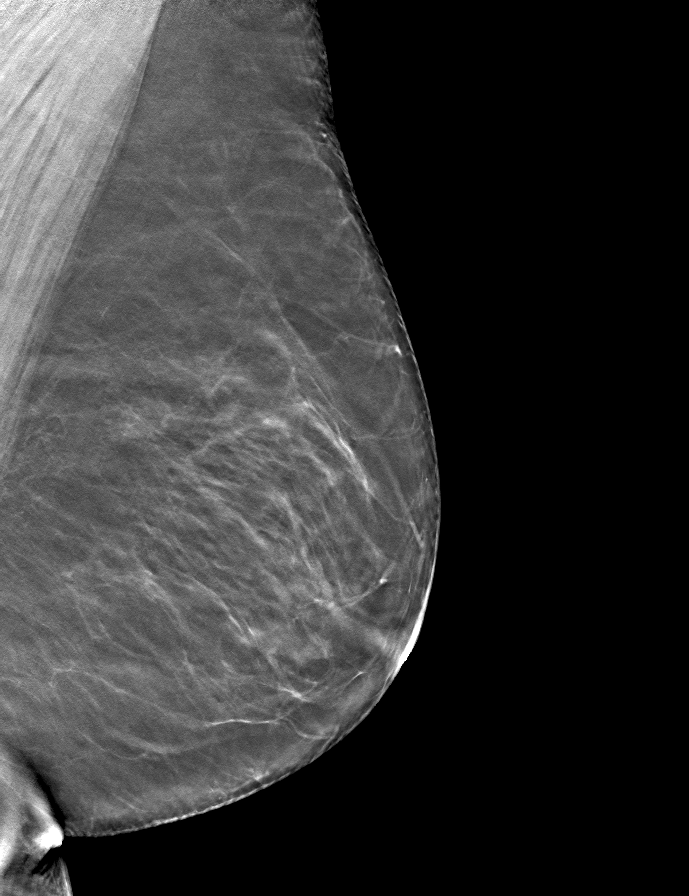

[9 of 24 positions shown; findings below may reference images not displayed]

ACR Breast Density Category b: There are scattered areas of
fibroglandular density.
FINDINGS: There are no findings suspicious for malignancy. Images were
processed with CAD.
IMPRESSION: No mammographic evidence of malignancy. A result letter of this
screening mammogram will be mailed directly to the patient.

RECOMMENDATION:
Screening mammogram in one year. (Code:Y5-G-EJ6)

BI-RADS CATEGORY  1: Negative.

## 2019-03-29 DIAGNOSIS — M25562 Pain in left knee: Secondary | ICD-10-CM | POA: Diagnosis not present

## 2019-03-29 DIAGNOSIS — M1712 Unilateral primary osteoarthritis, left knee: Secondary | ICD-10-CM | POA: Diagnosis not present

## 2019-04-05 DIAGNOSIS — M25562 Pain in left knee: Secondary | ICD-10-CM | POA: Diagnosis not present

## 2019-04-05 DIAGNOSIS — M1712 Unilateral primary osteoarthritis, left knee: Secondary | ICD-10-CM | POA: Diagnosis not present

## 2019-04-12 DIAGNOSIS — M25562 Pain in left knee: Secondary | ICD-10-CM | POA: Diagnosis not present

## 2019-04-12 DIAGNOSIS — M1712 Unilateral primary osteoarthritis, left knee: Secondary | ICD-10-CM | POA: Diagnosis not present

## 2019-04-19 DIAGNOSIS — M25562 Pain in left knee: Secondary | ICD-10-CM | POA: Diagnosis not present

## 2019-04-19 DIAGNOSIS — M1712 Unilateral primary osteoarthritis, left knee: Secondary | ICD-10-CM | POA: Diagnosis not present

## 2019-04-26 DIAGNOSIS — M1712 Unilateral primary osteoarthritis, left knee: Secondary | ICD-10-CM | POA: Diagnosis not present

## 2019-04-26 DIAGNOSIS — M25562 Pain in left knee: Secondary | ICD-10-CM | POA: Diagnosis not present

## 2019-05-01 DIAGNOSIS — B351 Tinea unguium: Secondary | ICD-10-CM | POA: Diagnosis not present

## 2019-05-25 ENCOUNTER — Other Ambulatory Visit: Payer: Self-pay

## 2019-08-16 DIAGNOSIS — M25562 Pain in left knee: Secondary | ICD-10-CM | POA: Diagnosis not present

## 2019-08-16 DIAGNOSIS — M1712 Unilateral primary osteoarthritis, left knee: Secondary | ICD-10-CM | POA: Diagnosis not present

## 2019-08-30 DIAGNOSIS — M1712 Unilateral primary osteoarthritis, left knee: Secondary | ICD-10-CM | POA: Diagnosis not present

## 2019-08-30 DIAGNOSIS — M25562 Pain in left knee: Secondary | ICD-10-CM | POA: Diagnosis not present

## 2019-10-04 DIAGNOSIS — R05 Cough: Secondary | ICD-10-CM | POA: Diagnosis not present

## 2019-10-04 DIAGNOSIS — J209 Acute bronchitis, unspecified: Secondary | ICD-10-CM | POA: Diagnosis not present

## 2019-10-04 DIAGNOSIS — R0602 Shortness of breath: Secondary | ICD-10-CM | POA: Diagnosis not present

## 2019-10-04 DIAGNOSIS — Z20828 Contact with and (suspected) exposure to other viral communicable diseases: Secondary | ICD-10-CM | POA: Diagnosis not present

## 2019-10-15 ENCOUNTER — Encounter: Payer: Self-pay | Admitting: Orthopaedic Surgery

## 2019-10-15 ENCOUNTER — Ambulatory Visit: Payer: Medicare Other | Admitting: Orthopaedic Surgery

## 2020-01-02 DIAGNOSIS — Z23 Encounter for immunization: Secondary | ICD-10-CM | POA: Diagnosis not present

## 2020-01-30 DIAGNOSIS — Z23 Encounter for immunization: Secondary | ICD-10-CM | POA: Diagnosis not present

## 2020-06-16 DIAGNOSIS — J069 Acute upper respiratory infection, unspecified: Secondary | ICD-10-CM | POA: Diagnosis not present

## 2020-08-01 DIAGNOSIS — M25562 Pain in left knee: Secondary | ICD-10-CM | POA: Diagnosis not present

## 2020-08-01 DIAGNOSIS — M25462 Effusion, left knee: Secondary | ICD-10-CM | POA: Diagnosis not present

## 2020-08-01 DIAGNOSIS — M1712 Unilateral primary osteoarthritis, left knee: Secondary | ICD-10-CM | POA: Diagnosis not present

## 2020-08-14 DIAGNOSIS — M25562 Pain in left knee: Secondary | ICD-10-CM | POA: Diagnosis not present

## 2020-08-14 DIAGNOSIS — M1712 Unilateral primary osteoarthritis, left knee: Secondary | ICD-10-CM | POA: Diagnosis not present

## 2020-08-27 DIAGNOSIS — M1712 Unilateral primary osteoarthritis, left knee: Secondary | ICD-10-CM | POA: Diagnosis not present

## 2020-08-27 DIAGNOSIS — M25562 Pain in left knee: Secondary | ICD-10-CM | POA: Diagnosis not present

## 2020-09-03 DIAGNOSIS — M25562 Pain in left knee: Secondary | ICD-10-CM | POA: Diagnosis not present

## 2020-09-03 DIAGNOSIS — M1712 Unilateral primary osteoarthritis, left knee: Secondary | ICD-10-CM | POA: Diagnosis not present

## 2020-09-10 DIAGNOSIS — M25562 Pain in left knee: Secondary | ICD-10-CM | POA: Diagnosis not present

## 2020-09-10 DIAGNOSIS — M1712 Unilateral primary osteoarthritis, left knee: Secondary | ICD-10-CM | POA: Diagnosis not present

## 2020-12-25 DIAGNOSIS — M25562 Pain in left knee: Secondary | ICD-10-CM | POA: Diagnosis not present

## 2020-12-25 DIAGNOSIS — M1712 Unilateral primary osteoarthritis, left knee: Secondary | ICD-10-CM | POA: Diagnosis not present

## 2021-01-01 DIAGNOSIS — M25562 Pain in left knee: Secondary | ICD-10-CM | POA: Diagnosis not present

## 2021-01-01 DIAGNOSIS — M1712 Unilateral primary osteoarthritis, left knee: Secondary | ICD-10-CM | POA: Diagnosis not present

## 2021-01-08 DIAGNOSIS — M1712 Unilateral primary osteoarthritis, left knee: Secondary | ICD-10-CM | POA: Diagnosis not present

## 2021-01-08 DIAGNOSIS — M25562 Pain in left knee: Secondary | ICD-10-CM | POA: Diagnosis not present

## 2021-01-17 DIAGNOSIS — Z23 Encounter for immunization: Secondary | ICD-10-CM | POA: Diagnosis not present

## 2021-03-17 ENCOUNTER — Ambulatory Visit: Payer: Medicare Other | Admitting: Orthopaedic Surgery

## 2021-03-24 ENCOUNTER — Other Ambulatory Visit: Payer: Self-pay

## 2021-03-24 ENCOUNTER — Ambulatory Visit (INDEPENDENT_AMBULATORY_CARE_PROVIDER_SITE_OTHER): Payer: Medicare Other | Admitting: Orthopaedic Surgery

## 2021-03-24 ENCOUNTER — Encounter: Payer: Self-pay | Admitting: Orthopaedic Surgery

## 2021-03-24 ENCOUNTER — Ambulatory Visit: Payer: Medicare Other

## 2021-03-24 VITALS — BP 168/89 | HR 72 | Ht 62.0 in | Wt 184.0 lb

## 2021-03-24 DIAGNOSIS — G8929 Other chronic pain: Secondary | ICD-10-CM

## 2021-03-24 DIAGNOSIS — M1712 Unilateral primary osteoarthritis, left knee: Secondary | ICD-10-CM

## 2021-03-24 DIAGNOSIS — M25562 Pain in left knee: Secondary | ICD-10-CM

## 2021-03-24 NOTE — Progress Notes (Signed)
My left knee hurts.  She has had long history of left knee pain.  She went to [facility] in [place] completing the course of treatment in February.  It did not help.  She has medial left knee pain, giving way, popping and swelling. She has pain with walking and now at rest.  She has no numbness, no redness, no distal edema.  She has no new trauma.  She has some pain of the right knee but not nearly as much as the left knee.    NV intact. ROM is 0 to 105 with crepitus and effusion.  Most of the pain is medially.  She has a limp to the left.  She has no distal edema.  X-rays were done of the left knee, reported separately.  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  Encounter Diagnosis  Name Primary?  . Chronic pain of left knee Yes   I have shown her the X-rays and made paper copies to show her daughter who is a Marine scientist. She has worn out the medial side of the knee.  She has tried viscosupplementation with no help.  I have asked her to seriously consider total knee replacement.  I have discussed the procedure with her.  I do not do surgery anymore and can arrange for her to see Dr. Aline Brochure or Dr. Amedeo Kinsman here in the office.  She wants to think about it.  She figured that was the problem and surgery would be recommended.  I will see her as needed.  Electronically Signed Sanjuana Kava, MD 5/31/202211:23 AM

## 2021-10-30 DIAGNOSIS — R059 Cough, unspecified: Secondary | ICD-10-CM | POA: Diagnosis not present

## 2021-10-30 DIAGNOSIS — R509 Fever, unspecified: Secondary | ICD-10-CM | POA: Diagnosis not present

## 2021-10-30 DIAGNOSIS — R0981 Nasal congestion: Secondary | ICD-10-CM | POA: Diagnosis not present

## 2021-12-03 DIAGNOSIS — J00 Acute nasopharyngitis [common cold]: Secondary | ICD-10-CM | POA: Diagnosis not present

## 2021-12-03 DIAGNOSIS — M25562 Pain in left knee: Secondary | ICD-10-CM | POA: Diagnosis not present

## 2021-12-03 DIAGNOSIS — Z0000 Encounter for general adult medical examination without abnormal findings: Secondary | ICD-10-CM | POA: Diagnosis not present

## 2021-12-03 DIAGNOSIS — R030 Elevated blood-pressure reading, without diagnosis of hypertension: Secondary | ICD-10-CM | POA: Insufficient documentation

## 2021-12-03 DIAGNOSIS — Z1152 Encounter for screening for COVID-19: Secondary | ICD-10-CM | POA: Diagnosis not present

## 2021-12-03 DIAGNOSIS — R0982 Postnasal drip: Secondary | ICD-10-CM | POA: Diagnosis not present

## 2021-12-03 DIAGNOSIS — J302 Other seasonal allergic rhinitis: Secondary | ICD-10-CM | POA: Insufficient documentation

## 2022-01-13 ENCOUNTER — Ambulatory Visit: Payer: Medicare Other | Admitting: Orthopedic Surgery

## 2022-01-13 ENCOUNTER — Other Ambulatory Visit: Payer: Self-pay

## 2022-01-13 ENCOUNTER — Encounter: Payer: Self-pay | Admitting: Orthopedic Surgery

## 2022-01-13 VITALS — BP 182/103 | HR 78 | Ht 61.0 in | Wt 187.6 lb

## 2022-01-13 DIAGNOSIS — M1710 Unilateral primary osteoarthritis, unspecified knee: Secondary | ICD-10-CM

## 2022-01-13 DIAGNOSIS — G8929 Other chronic pain: Secondary | ICD-10-CM | POA: Diagnosis not present

## 2022-01-13 DIAGNOSIS — Z01818 Encounter for other preprocedural examination: Secondary | ICD-10-CM | POA: Diagnosis not present

## 2022-01-13 DIAGNOSIS — M25562 Pain in left knee: Secondary | ICD-10-CM | POA: Diagnosis not present

## 2022-01-13 MED ORDER — BUPIVACAINE-MELOXICAM ER 200-6 MG/7ML IJ SOLN: 400.0000 mg | Freq: Once | INTRAMUSCULAR | Status: DC

## 2022-01-13 NOTE — Addendum Note (Signed)
Addended by: Obie Dredge A on: 01/13/2022 02:59 PM ? ? Modules accepted: Orders ? ?
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
## 2022-01-13 NOTE — Patient Instructions (Signed)
You have decided to proceed with knee replacement surgery. You have decided not to continue with nonoperative measures such as but not limited to oral medication, weight loss, activity modification, physical therapy, bracing, or injection.  We will perform the procedure commonly known as total knee replacement. Some of the risks associated with knee replacement surgery include but are not limited to Bleeding Infection Swelling Stiffness Blood clot Pulmonary embolism  Loosening of the implant Pain that persists even after surgery  Infection is especially devastating complication of knee surgery although rare. If infection does occur your implant will usually have to be removed and several surgeries and antibiotics will be needed to eradicate the infection prior to performing a repeat replacement.   In some cases amputation is required to eradicate the infection. In other rare cases a knee fusion is needed   In compliance with recent [place] law in federal regulation regarding opioid use and abuse and addiction, we will taper (stop) opioid medication after 2 weeks.  If you're not comfortable with these risks and would like to continue with nonoperative treatment please let Dr. Earnestine Shipp know prior to your surgery.  

## 2022-01-15 ENCOUNTER — Other Ambulatory Visit: Payer: Self-pay

## 2022-01-15 DIAGNOSIS — M1710 Unilateral primary osteoarthritis, unspecified knee: Secondary | ICD-10-CM

## 2022-01-19 DIAGNOSIS — M1710 Unilateral primary osteoarthritis, unspecified knee: Secondary | ICD-10-CM | POA: Diagnosis not present

## 2022-01-21 ENCOUNTER — Other Ambulatory Visit: Payer: Self-pay

## 2022-01-25 ENCOUNTER — Telehealth: Payer: Self-pay

## 2022-01-25 NOTE — Telephone Encounter (Signed)
Patient is scheduled for surgery on 4/11 and she has some questions . She would like to speak with Dr. Ruthe Mannan assistant. ? ?Please call her at [phone]. ?

## 2022-01-26 ENCOUNTER — Other Ambulatory Visit: Payer: Self-pay

## 2022-01-26 DIAGNOSIS — M1710 Unilateral primary osteoarthritis, unspecified knee: Secondary | ICD-10-CM

## 2022-01-26 DIAGNOSIS — G8929 Other chronic pain: Secondary | ICD-10-CM

## 2022-01-26 NOTE — Telephone Encounter (Signed)
Order faxed to [facility] for bedside commode.  ?

## 2022-01-26 NOTE — Telephone Encounter (Signed)
Patient called asking for a prescription for a bedside commode for after surgery. Would you like for me to send the order in? Please advise.  ?

## 2022-01-26 NOTE — Telephone Encounter (Signed)
yes

## 2022-01-27 NOTE — Patient Instructions (Signed)
? ? ? ? ? ? ? ? Kelly Fox ? 01/27/2022  ?  ? '@PREFPERIOPPHARMACY'$ @ ? ? Your procedure is scheduled on  02/02/2022. ? ? Report to [facility] at  0600 A.M. ? ? Call this number if you have problems the morning of surgery: ? [phone] ? ? Remember: ? Do not eat after midnight. ? ? You may drink clear liquids until 0330 AM 02/02/2022. ? ?  Clear liquids allowed are:                    Water, Juice (non-citric and without pulp - diabetics please choose diet or no sugar options), Carbonated beverages - (diabetics please choose diet or no sugar options), Clear Tea, Black Coffee only (no creamer, milk or cream including half and half), Plain Jell-O only (diabetics please choose diet or no sugar options), Gatorade (diabetics please choose diet or no sugar options), and Plain Popsicles only ?  ? ?At 0330 AM, 02/02/2022 drink your carb drink. ? ?Use your inhaler before you come and bring your rescue inhaler with you. ? ? ? Take these medicines the morning of surgery with A SIP OF WATER  ? ?None ?  ? Do not wear jewelry, make-up or nail polish. ? Do not wear lotions, powders, or perfumes, or deodorant. ? Do not shave 48 hours prior to surgery.  Men may shave face and neck. ? Do not bring valuables to the hospital. ?  is not responsible for any belongings or valuables. ? ?Contacts, dentures or bridgework may not be worn into surgery.  Leave your suitcase in the car.  After surgery it may be brought to your room. ? ?For patients admitted to the hospital, discharge time will be determined by your treatment team. ? ?Patients discharged the day of surgery will not be allowed to drive home and must have someone with them for 24 hours.  ? ? ?Special instructions:  DO NOT smoke tobacco or vape for 24 hours before your procedure. ? ?Please read over the following fact sheets that you were given. ?Pain Booklet, Coughing and Deep Breathing, Blood Transfusion Information, Lab Information, Total Joint Packet, Surgical Site  Infection Prevention, Anesthesia Post-op Instructions, and Care and Recovery After Surgery ?  ? ? ? Total Knee Replacement, Care After ?This sheet gives you information about how to care for yourself after your procedure. Your health care provider may also give you more specific instructions. If you have problems or questions, contact your health care provider. ?What can I expect after the procedure? ?After the procedure, it is common to have: ?Redness, pain, and swelling at the incision area. ?Stiffness. ?Discomfort. ?A small amount of blood or clear fluid coming from your incision. ?Follow these instructions at home: ?Medicines ?Take over-the-counter and prescription medicines only as told by your health care provider. ?If you were prescribed a blood thinner (anticoagulant), take it as told by your health care provider. ?Ask your health care provider if the medicine prescribed to you: ?Requires you to avoid driving or using machinery. ?Can cause constipation. You may need to take these actions to prevent or treat constipation: ?Drink enough fluid to keep your urine pale yellow. ?Take over-the-counter or prescription medicines. ?Eat foods that are high in fiber, such as beans, whole grains, and fresh fruits and vegetables. ?Limit foods that are high in fat and processed sugars, such as fried or sweet foods. ?Incision care ? ?Follow instructions from your health care provider about how to take care  of your incision. Make sure you: ?Wash your hands with soap and water for at least 20 seconds before and after you change your bandage (dressing). If soap and water are not available, use hand sanitizer. ?Change your dressing as told by your health care provider. ?Leave stitches (sutures), staples, skin glue, or adhesive strips in place. These skin closures may need to stay in place for 2 weeks or longer. If adhesive strip edges start to loosen and curl up, you may trim the loose edges. Do not remove adhesive strips  completely unless your health care provider tells you to do that. ?Do not take baths, swim, or use a hot tub until your health care provider approves. ?Check your incision area every day for signs of infection. Check for: ?More redness, swelling, or pain. ?More fluid or blood. ?Warmth. ?Pus or a bad smell. ?Activity ?Rest as told by your health care provider. ?Avoid sitting for a long time without moving. Get up to take short walks every 1-2 hours. This is important to improve blood flow and breathing. Ask for help if you feel weak or unsteady. ?Follow instructions from your health care provider about using a walker, crutches, or a cane. ?You may use your legs to support (bear) your body weight as told by your health care provider. Follow instructions about how much weight you may safely support on your affected leg (weight-bearing restrictions). ?A physical therapist may show you how to get out of a bed and chair and how to go up and down stairs. You will first do this with a walker, crutches, or a cane and then without any of these devices. ?Once you are able to walk without a limp, you may stop using a walker, crutches, or a cane. ?Do exercises as told by your health care provider or physical therapist. ?Avoid high-impact activities, including running, jumping rope, and doing jumping jacks. ?Do not play contact sports until your health care provider approves. ?Return to your normal activities as told by your health care provider. Ask your health care provider what activities are safe for you. ?Managing pain, stiffness, and swelling ? ?If directed, put ice on your knee. To do this: ?Put ice in a plastic bag or use the icing device (cold flow pad) that you were given. Follow instructions from your health care provider about how to use the icing device. ?Place a towel between your skin and the bag or between your skin and the icing device. ?Leave the ice on for 20 minutes, 2-3 times a day. ?Remove the ice if your  skin turns bright red. This is very important. If you cannot feel pain, heat, or cold, you have a greater risk of damage to the area. ?Move your toes often to reduce stiffness and swelling. ?Raise (elevate) your leg above the level of your heart while you are sitting or lying down. ?Use several pillows to keep your leg straight. ?Do not put a pillow just under the knee. If the knee is bent for a long time, this may lead to stiffness. ?Wear elastic knee support as told by your health care provider. ?Safety ? ?To help prevent falls, keep floors clear of objects you may trip over. Place items that you may need within easy reach. ?Wear an apron or tool belt with pockets for carrying objects. This leaves your hands free to help with your balance. ?Ask your health care provider when it is safe to drive. ?General instructions ?Wear compression stockings as told by your health  care provider. These stockings help to prevent blood clots and reduce swelling in your legs. ?Continue with breathing exercises. This helps prevent lung infection. ?Do not use any products that contain nicotine or tobacco. These products include cigarettes, chewing tobacco, and vaping devices, such as e-cigarettes. These can delay healing after surgery. If you need help quitting, ask your health care provider. ?Tell your health care provider if you plan to have dental work. Also: ?Tell your dentist about your joint replacement. ?Ask your health care provider if there are any special instructions you need to follow before having dental care and routine cleanings. ?Keep all follow-up visits. This is important. ?Contact a health care provider if: ?You have a fever or chills. ?You have a cough or feel short of breath. ?Your medicine is not controlling your pain. ?You have any of these signs of infection: ?More redness, swelling, or pain around your incision. ?More fluid or blood coming from your incision. ?Warmth coming from your incision. ?Pus or a bad  smell coming from your incision. ?You fall. ?Get help right away if: ?You have severe pain. ?You have trouble breathing. ?You have chest pain. ?You have redness, swelling, pain, or warmth in your calf or leg. ?Hope Budds

## 2022-01-28 ENCOUNTER — Telehealth: Payer: Self-pay | Admitting: Orthopedic Surgery

## 2022-01-28 NOTE — Telephone Encounter (Signed)
Patient wants someone to call her she has questions about Physical Therapy coming to her house  ? ?  ?

## 2022-01-28 NOTE — Telephone Encounter (Signed)
Called patient back. No answer. Left generic message asking patient to call back.  ?

## 2022-02-01 ENCOUNTER — Other Ambulatory Visit (HOSPITAL_COMMUNITY): Admission: RE | Admit: 2022-02-01 | Payer: Medicare Other | Source: Ambulatory Visit

## 2022-02-01 ENCOUNTER — Encounter (HOSPITAL_COMMUNITY): Payer: Self-pay

## 2022-02-01 ENCOUNTER — Encounter (HOSPITAL_COMMUNITY)
Admission: RE | Admit: 2022-02-01 | Discharge: 2022-02-01 | Disposition: A | Discharge status: Home / Self Care | Payer: Medicare Other | Source: Ambulatory Visit | Attending: Orthopedic Surgery | Admitting: Orthopedic Surgery

## 2022-02-01 DIAGNOSIS — M1710 Unilateral primary osteoarthritis, unspecified knee: Secondary | ICD-10-CM | POA: Diagnosis not present

## 2022-02-01 DIAGNOSIS — Z01818 Encounter for other preprocedural examination: Secondary | ICD-10-CM

## 2022-02-01 DIAGNOSIS — Z01812 Encounter for preprocedural laboratory examination: Secondary | ICD-10-CM | POA: Diagnosis not present

## 2022-02-01 DIAGNOSIS — M25562 Pain in left knee: Secondary | ICD-10-CM | POA: Diagnosis not present

## 2022-02-01 HISTORY — DX: Unspecified asthma, uncomplicated: J45.909

## 2022-02-01 LAB — CBC WITH DIFFERENTIAL/PLATELET
Abs Immature Granulocytes: 0.01 10*3/uL (ref 0.00–0.07)
Basophils Absolute: 0.1 10*3/uL (ref 0.0–0.1)
Basophils Relative: 1 %
Eosinophils Absolute: 0.2 10*3/uL (ref 0.0–0.5)
Eosinophils Relative: 4 %
HCT: 35.8 % — ABNORMAL LOW (ref 36.0–46.0)
Hemoglobin: 10.9 g/dL — ABNORMAL LOW (ref 12.0–15.0)
Immature Granulocytes: 0 %
Lymphocytes Relative: 46 %
Lymphs Abs: 2.1 10*3/uL (ref 0.7–4.0)
MCH: 24.3 pg — ABNORMAL LOW (ref 26.0–34.0)
MCHC: 30.4 g/dL (ref 30.0–36.0)
MCV: 79.7 fL — ABNORMAL LOW (ref 80.0–100.0)
Monocytes Absolute: 0.4 10*3/uL (ref 0.1–1.0)
Monocytes Relative: 8 %
Neutro Abs: 1.9 10*3/uL (ref 1.7–7.7)
Neutrophils Relative %: 41 %
Platelets: 248 10*3/uL (ref 150–400)
RBC: 4.49 MIL/uL (ref 3.87–5.11)
RDW: 15.9 % — ABNORMAL HIGH (ref 11.5–15.5)
WBC: 4.7 10*3/uL (ref 4.0–10.5)
nRBC: 0 % (ref 0.0–0.2)

## 2022-02-01 LAB — APTT: aPTT: 30 seconds (ref 24–36)

## 2022-02-01 LAB — BASIC METABOLIC PANEL
Anion gap: 7 (ref 5–15)
BUN: 13 mg/dL (ref 8–23)
CO2: 28 mmol/L (ref 22–32)
Calcium: 9.3 mg/dL (ref 8.9–10.3)
Chloride: 104 mmol/L (ref 98–111)
Creatinine, Ser: 0.66 mg/dL (ref 0.44–1.00)
GFR, Estimated: >60 mL/min (ref >60)
Glucose, Bld: 105 mg/dL — ABNORMAL HIGH (ref 70–99)
Potassium: 4.1 mmol/L (ref 3.5–5.1)
Sodium: 139 mmol/L (ref 135–145)

## 2022-02-01 LAB — TYPE AND SCREEN
ABO/RH(D): A POS
Antibody Screen: NEGATIVE

## 2022-02-01 LAB — PROTIME-INR
INR: 1 (ref 0.8–1.2)
Prothrombin Time: 13.2 s (ref 11.4–15.2)
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
## 2022-02-01 NOTE — Telephone Encounter (Signed)
Spoke with patient. She has concerns about HHPT coming and she hasn't heard from them. I advised her that her packet has already been faxed over to the [facility] department and they would call her. She will need to start on 02/04/22. Advised her I would ask them to call her to discuss the next step. She is in agreement with treatment plan.  ?

## 2022-02-02 ENCOUNTER — Encounter (HOSPITAL_COMMUNITY): Payer: Self-pay | Admitting: Orthopedic Surgery

## 2022-02-02 ENCOUNTER — Observation Stay (HOSPITAL_COMMUNITY)
Admission: RE | Admit: 2022-02-02 | Discharge: 2022-02-03 | Disposition: A | Discharge status: Home Health | Payer: Medicare Other | Attending: Orthopedic Surgery | Admitting: Orthopedic Surgery

## 2022-02-02 ENCOUNTER — Encounter (HOSPITAL_COMMUNITY)
Admission: RE | Disposition: A | Discharge status: Home Health | Payer: Self-pay | Source: Home / Self Care | Attending: Orthopedic Surgery

## 2022-02-02 ENCOUNTER — Ambulatory Visit (HOSPITAL_COMMUNITY): Payer: Medicare Other | Admitting: Anesthesiology

## 2022-02-02 ENCOUNTER — Ambulatory Visit (HOSPITAL_COMMUNITY): Payer: Medicare Other

## 2022-02-02 ENCOUNTER — Ambulatory Visit (HOSPITAL_BASED_OUTPATIENT_CLINIC_OR_DEPARTMENT_OTHER): Payer: Medicare Other | Admitting: Anesthesiology

## 2022-02-02 ENCOUNTER — Other Ambulatory Visit: Payer: Self-pay

## 2022-02-02 DIAGNOSIS — G8929 Other chronic pain: Secondary | ICD-10-CM | POA: Diagnosis not present

## 2022-02-02 DIAGNOSIS — M25562 Pain in left knee: Secondary | ICD-10-CM | POA: Diagnosis not present

## 2022-02-02 DIAGNOSIS — M6281 Muscle weakness (generalized): Secondary | ICD-10-CM | POA: Insufficient documentation

## 2022-02-02 DIAGNOSIS — R2681 Unsteadiness on feet: Secondary | ICD-10-CM | POA: Insufficient documentation

## 2022-02-02 DIAGNOSIS — S8992XS Unspecified injury of left lower leg, sequela: Secondary | ICD-10-CM | POA: Insufficient documentation

## 2022-02-02 DIAGNOSIS — R2689 Other abnormalities of gait and mobility: Secondary | ICD-10-CM | POA: Diagnosis not present

## 2022-02-02 DIAGNOSIS — Z96652 Presence of left artificial knee joint: Secondary | ICD-10-CM | POA: Diagnosis not present

## 2022-02-02 DIAGNOSIS — M1712 Unilateral primary osteoarthritis, left knee: Secondary | ICD-10-CM | POA: Diagnosis not present

## 2022-02-02 DIAGNOSIS — H538 Other visual disturbances: Secondary | ICD-10-CM | POA: Insufficient documentation

## 2022-02-02 DIAGNOSIS — J45909 Unspecified asthma, uncomplicated: Secondary | ICD-10-CM | POA: Diagnosis not present

## 2022-02-02 DIAGNOSIS — G8918 Other acute postprocedural pain: Secondary | ICD-10-CM | POA: Diagnosis not present

## 2022-02-02 HISTORY — PX: TOTAL KNEE ARTHROPLASTY: SHX125

## 2022-02-02 LAB — ABO/RH: ABO/RH(D): A POS

## 2022-02-02 SURGERY — ARTHROPLASTY, KNEE, TOTAL
Anesthesia: Spinal | Site: Knee | Laterality: Left

## 2022-02-02 MED ORDER — CHLORHEXIDINE GLUCONATE 0.12 % MT SOLN
15.0000 mL | Freq: Once | OROMUCOSAL | Status: AC
  Administered 2022-02-02: 15 mL via OROMUCOSAL

## 2022-02-02 MED ORDER — 0.9 % SODIUM CHLORIDE (POUR BTL) OPTIME
TOPICAL | Status: DC | PRN
Start: 2022-02-02 — End: 2022-02-02
  Administered 2022-02-02: 1000 mL

## 2022-02-02 MED ORDER — BUPIVACAINE-MELOXICAM ER 200-6 MG/7ML IJ SOLN
INTRAMUSCULAR | Status: AC
  Filled 2022-02-02: qty 1

## 2022-02-02 MED ORDER — DEXAMETHASONE SODIUM PHOSPHATE 4 MG/ML IJ SOLN
INTRAMUSCULAR | Status: DC | PRN
  Administered 2022-02-02: 8 mg via PERINEURAL

## 2022-02-02 MED ORDER — METHOCARBAMOL 500 MG PO TABS: 500.0000 mg | ORAL_TABLET | Freq: Four times a day (QID) | ORAL | Status: DC | PRN

## 2022-02-02 MED ORDER — DEXAMETHASONE SODIUM PHOSPHATE 10 MG/ML IJ SOLN
10.0000 mg | Freq: Once | INTRAMUSCULAR | Status: AC
  Administered 2022-02-03: 10 mg via INTRAVENOUS
  Filled 2022-02-02: qty 1

## 2022-02-02 MED ORDER — METHOCARBAMOL 1000 MG/10ML IJ SOLN
500.0000 mg | Freq: Four times a day (QID) | INTRAVENOUS | Status: DC | PRN
  Filled 2022-02-02: qty 5

## 2022-02-02 MED ORDER — HYDROCODONE-ACETAMINOPHEN 7.5-325 MG PO TABS: 1.0000 | ORAL_TABLET | ORAL | Status: DC | PRN

## 2022-02-02 MED ORDER — MENTHOL 3 MG MT LOZG: 1.0000 | LOZENGE | OROMUCOSAL | Status: DC | PRN

## 2022-02-02 MED ORDER — CELECOXIB 400 MG PO CAPS
400.0000 mg | ORAL_CAPSULE | Freq: Once | ORAL | Status: AC
  Administered 2022-02-02: 400 mg via ORAL
  Filled 2022-02-02: qty 1

## 2022-02-02 MED ORDER — SODIUM CHLORIDE 0.9 % IR SOLN
Status: DC | PRN
  Administered 2022-02-02: 3000 mL

## 2022-02-02 MED ORDER — METOCLOPRAMIDE HCL 5 MG/ML IJ SOLN: 5.0000 mg | Freq: Three times a day (TID) | INTRAMUSCULAR | Status: DC | PRN

## 2022-02-02 MED ORDER — PHENOL 1.4 % MT LIQD: 1.0000 | OROMUCOSAL | Status: DC | PRN

## 2022-02-02 MED ORDER — PROPOFOL 500 MG/50ML IV EMUL
INTRAVENOUS | Status: AC
  Filled 2022-02-02: qty 50

## 2022-02-02 MED ORDER — ONDANSETRON HCL 4 MG/2ML IJ SOLN
INTRAMUSCULAR | Status: DC | PRN
  Administered 2022-02-02: 4 mg via INTRAVENOUS

## 2022-02-02 MED ORDER — GABAPENTIN 300 MG PO CAPS
300.0000 mg | ORAL_CAPSULE | Freq: Once | ORAL | Status: AC
Start: 2022-02-02 — End: 2022-02-02
  Administered 2022-02-02: 300 mg via ORAL
  Filled 2022-02-02: qty 1

## 2022-02-02 MED ORDER — HYDROMORPHONE HCL 1 MG/ML IJ SOLN: 0.2500 mg | INTRAMUSCULAR | Status: DC | PRN

## 2022-02-02 MED ORDER — BUPIVACAINE IN DEXTROSE 0.75-8.25 % IT SOLN
INTRATHECAL | Status: DC | PRN
  Administered 2022-02-02: 1.6 mL via INTRATHECAL

## 2022-02-02 MED ORDER — ONDANSETRON HCL 4 MG/2ML IJ SOLN
4.0000 mg | Freq: Once | INTRAMUSCULAR | Status: AC
  Administered 2022-02-02: 4 mg via INTRAVENOUS
  Filled 2022-02-02: qty 2

## 2022-02-02 MED ORDER — DEXAMETHASONE SODIUM PHOSPHATE 10 MG/ML IJ SOLN
INTRAMUSCULAR | Status: DC | PRN
  Administered 2022-02-02: 5 mg via INTRAVENOUS

## 2022-02-02 MED ORDER — FENTANYL CITRATE (PF) 100 MCG/2ML IJ SOLN
INTRAMUSCULAR | Status: AC
  Filled 2022-02-02: qty 2

## 2022-02-02 MED ORDER — ORAL CARE MOUTH RINSE: 15.0000 mL | Freq: Once | OROMUCOSAL | Status: AC

## 2022-02-02 MED ORDER — PROPOFOL 500 MG/50ML IV EMUL
INTRAVENOUS | Status: DC | PRN
  Administered 2022-02-02: 50 ug/kg/min via INTRAVENOUS

## 2022-02-02 MED ORDER — LACTATED RINGERS IV SOLN: INTRAVENOUS | Status: DC

## 2022-02-02 MED ORDER — LIDOCAINE HCL (PF) 1 % IJ SOLN
INTRAMUSCULAR | Status: DC | PRN
  Administered 2022-02-02: 4 mL
  Administered 2022-02-02: 2 mL

## 2022-02-02 MED ORDER — BUPIVACAINE-MELOXICAM ER 200-6 MG/7ML IJ SOLN
INTRAMUSCULAR | Status: DC | PRN
  Administered 2022-02-02: 400 mg

## 2022-02-02 MED ORDER — PHENYLEPHRINE HCL-NACL 20-0.9 MG/250ML-% IV SOLN
INTRAVENOUS | Status: DC | PRN
  Administered 2022-02-02: 40 ug/min via INTRAVENOUS

## 2022-02-02 MED ORDER — POVIDONE-IODINE 10 % EX SWAB: 2.0000 "application " | Freq: Once | CUTANEOUS | Status: DC

## 2022-02-02 MED ORDER — OXYCODONE HCL 5 MG PO TABS
5.0000 mg | ORAL_TABLET | Freq: Once | ORAL | Status: AC
  Administered 2022-02-02: 5 mg via ORAL
  Filled 2022-02-02: qty 1

## 2022-02-02 MED ORDER — METOCLOPRAMIDE HCL 10 MG PO TABS: 5.0000 mg | ORAL_TABLET | Freq: Three times a day (TID) | ORAL | Status: DC | PRN

## 2022-02-02 MED ORDER — DEXAMETHASONE SODIUM PHOSPHATE 10 MG/ML IJ SOLN
INTRAMUSCULAR | Status: AC
  Filled 2022-02-02: qty 1

## 2022-02-02 MED ORDER — SODIUM CHLORIDE 0.9 % IV SOLN: INTRAVENOUS | Status: DC

## 2022-02-02 MED ORDER — TRAMADOL HCL 50 MG PO TABS
50.0000 mg | ORAL_TABLET | Freq: Four times a day (QID) | ORAL | Status: DC
  Administered 2022-02-02 – 2022-02-03 (×3): 50 mg via ORAL
  Filled 2022-02-02 (×3): qty 1

## 2022-02-02 MED ORDER — DEXAMETHASONE SODIUM PHOSPHATE 4 MG/ML IJ SOLN
INTRAMUSCULAR | Status: AC
  Filled 2022-02-02: qty 2

## 2022-02-02 MED ORDER — ALBUTEROL SULFATE (2.5 MG/3ML) 0.083% IN NEBU: 3.0000 mL | INHALATION_SOLUTION | Freq: Four times a day (QID) | RESPIRATORY_TRACT | Status: DC | PRN

## 2022-02-02 MED ORDER — ONDANSETRON HCL 4 MG/2ML IJ SOLN: 4.0000 mg | Freq: Four times a day (QID) | INTRAMUSCULAR | Status: DC | PRN

## 2022-02-02 MED ORDER — TRANEXAMIC ACID-NACL 1000-0.7 MG/100ML-% IV SOLN
1000.0000 mg | Freq: Once | INTRAVENOUS | Status: AC
  Administered 2022-02-02: 1000 mg via INTRAVENOUS
  Filled 2022-02-02: qty 100

## 2022-02-02 MED ORDER — MIDAZOLAM HCL 2 MG/2ML IJ SOLN
INTRAMUSCULAR | Status: AC
  Filled 2022-02-02: qty 2

## 2022-02-02 MED ORDER — MORPHINE SULFATE (PF) 2 MG/ML IV SOLN: 0.5000 mg | INTRAVENOUS | Status: DC | PRN

## 2022-02-02 MED ORDER — BUPIVACAINE HCL (PF) 0.25 % IJ SOLN
INTRAMUSCULAR | Status: DC | PRN
  Administered 2022-02-02: 30 mL via PERINEURAL

## 2022-02-02 MED ORDER — ACETAMINOPHEN 325 MG PO TABS: 325.0000 mg | ORAL_TABLET | Freq: Four times a day (QID) | ORAL | Status: DC | PRN

## 2022-02-02 MED ORDER — BISACODYL 5 MG PO TBEC: 5.0000 mg | DELAYED_RELEASE_TABLET | Freq: Every day | ORAL | Status: DC | PRN

## 2022-02-02 MED ORDER — FENTANYL CITRATE (PF) 250 MCG/5ML IJ SOLN
INTRAMUSCULAR | Status: DC | PRN
  Administered 2022-02-02 (×2): 50 ug via INTRAVENOUS

## 2022-02-02 MED ORDER — BUPIVACAINE HCL (PF) 0.25 % IJ SOLN
INTRAMUSCULAR | Status: AC
  Filled 2022-02-02: qty 30

## 2022-02-02 MED ORDER — ALUM & MAG HYDROXIDE-SIMETH 200-200-20 MG/5ML PO SUSP: 30.0000 mL | ORAL | Status: DC | PRN

## 2022-02-02 MED ORDER — ONDANSETRON HCL 4 MG/2ML IJ SOLN: 4.0000 mg | Freq: Once | INTRAMUSCULAR | Status: DC | PRN

## 2022-02-02 MED ORDER — LIDOCAINE HCL (PF) 1 % IJ SOLN
INTRAMUSCULAR | Status: AC
Start: 2022-02-02 — End: ?
  Filled 2022-02-02: qty 30

## 2022-02-02 MED ORDER — MIDAZOLAM HCL 2 MG/2ML IJ SOLN
INTRAMUSCULAR | Status: DC | PRN
  Administered 2022-02-02: 1 mg via INTRAVENOUS

## 2022-02-02 MED ORDER — DIPHENHYDRAMINE HCL 12.5 MG/5ML PO ELIX: 12.5000 mg | ORAL_SOLUTION | ORAL | Status: DC | PRN

## 2022-02-02 MED ORDER — BUPIVACAINE IN DEXTROSE 0.75-8.25 % IT SOLN
INTRATHECAL | Status: AC
  Filled 2022-02-02: qty 2

## 2022-02-02 MED ORDER — PREGABALIN 50 MG PO CAPS
50.0000 mg | ORAL_CAPSULE | Freq: Once | ORAL | Status: AC
  Administered 2022-02-02: 50 mg via ORAL
  Filled 2022-02-02: qty 1

## 2022-02-02 MED ORDER — ASPIRIN 81 MG PO CHEW
81.0000 mg | CHEWABLE_TABLET | Freq: Two times a day (BID) | ORAL | Status: DC
  Administered 2022-02-02 – 2022-02-03 (×2): 81 mg via ORAL
  Filled 2022-02-02 (×2): qty 1

## 2022-02-02 MED ORDER — CELECOXIB 100 MG PO CAPS
200.0000 mg | ORAL_CAPSULE | Freq: Two times a day (BID) | ORAL | Status: DC
  Administered 2022-02-02 – 2022-02-03 (×2): 200 mg via ORAL
  Filled 2022-02-02 (×2): qty 2

## 2022-02-02 MED ORDER — ACETAMINOPHEN 500 MG PO TABS
500.0000 mg | ORAL_TABLET | Freq: Four times a day (QID) | ORAL | Status: DC
  Administered 2022-02-02 – 2022-02-03 (×3): 500 mg via ORAL
  Filled 2022-02-02 (×3): qty 1

## 2022-02-02 MED ORDER — METHOCARBAMOL 1000 MG/10ML IJ SOLN
500.0000 mg | Freq: Once | INTRAVENOUS | Status: AC
  Administered 2022-02-02: 500 mg via INTRAVENOUS
  Filled 2022-02-02: qty 5

## 2022-02-02 MED ORDER — CEFAZOLIN SODIUM-DEXTROSE 2-4 GM/100ML-% IV SOLN
2.0000 g | Freq: Four times a day (QID) | INTRAVENOUS | Status: AC
  Administered 2022-02-02 (×2): 2 g via INTRAVENOUS
  Filled 2022-02-02 (×2): qty 100

## 2022-02-02 MED ORDER — CEFAZOLIN SODIUM-DEXTROSE 2-4 GM/100ML-% IV SOLN
2.0000 g | INTRAVENOUS | Status: AC
  Administered 2022-02-02: 2 g via INTRAVENOUS
  Filled 2022-02-02: qty 100

## 2022-02-02 MED ORDER — POLYETHYLENE GLYCOL 3350 17 G PO PACK
17.0000 g | PACK | Freq: Every day | ORAL | Status: DC
  Administered 2022-02-02 – 2022-02-03 (×2): 17 g via ORAL
  Filled 2022-02-02 (×2): qty 1

## 2022-02-02 MED ORDER — ONDANSETRON HCL 4 MG PO TABS
4.0000 mg | ORAL_TABLET | Freq: Four times a day (QID) | ORAL | Status: DC | PRN
Start: 2022-02-02 — End: 2022-02-03

## 2022-02-02 MED ORDER — DOCUSATE SODIUM 100 MG PO CAPS
100.0000 mg | ORAL_CAPSULE | Freq: Two times a day (BID) | ORAL | Status: DC
  Administered 2022-02-02 – 2022-02-03 (×2): 100 mg via ORAL
  Filled 2022-02-02 (×2): qty 1

## 2022-02-02 MED ORDER — ACETAMINOPHEN 500 MG PO TABS
1000.0000 mg | ORAL_TABLET | Freq: Once | ORAL | Status: AC
  Administered 2022-02-02: 1000 mg via ORAL
  Filled 2022-02-02: qty 2

## 2022-02-02 MED ORDER — TRANEXAMIC ACID-NACL 1000-0.7 MG/100ML-% IV SOLN
1000.0000 mg | INTRAVENOUS | Status: AC
  Administered 2022-02-02: 1000 mg via INTRAVENOUS
  Filled 2022-02-02: qty 100

## 2022-02-02 MED ORDER — HYDROCODONE-ACETAMINOPHEN 5-325 MG PO TABS: 1.0000 | ORAL_TABLET | ORAL | Status: DC | PRN

## 2022-02-02 SURGICAL SUPPLY — 60 items
ATTUNE MED DOME PAT 32 KNEE (Knees) ×1 IMPLANT
ATTUNE PSFEM LTSZ5 NARCEM KNEE (Femur) ×1 IMPLANT
BANDAGE ESMARK 6X9 LF (GAUZE/BANDAGES/DRESSINGS) ×1 IMPLANT
BASEPLATE TIB CMT FB PCKT SZ4 (Stem) ×1 IMPLANT
BLADE SAGITTAL 25.0X1.27X90 (BLADE) ×2 IMPLANT
BLADE SAW SGTL 11.0X1.19X90.0M (BLADE) ×2 IMPLANT
BNDG ESMARK 6X9 LF (GAUZE/BANDAGES/DRESSINGS) ×2
CEMENT HV SMART SET (Cement) ×4 IMPLANT
CLOTH BEACON ORANGE TIMEOUT ST (SAFETY) ×2 IMPLANT
COOLER ICEMAN CLASSIC (MISCELLANEOUS) ×2 IMPLANT
COVER LIGHT HANDLE STERIS (MISCELLANEOUS) ×4 IMPLANT
CUFF TOURN SGL QUICK 34 (TOURNIQUET CUFF) ×1
CUFF TRNQT CYL 34X4.125X (TOURNIQUET CUFF) ×1 IMPLANT
DRAPE BACK TABLE (DRAPES) ×2 IMPLANT
DRAPE EXTREMITY T 121X128X90 (DISPOSABLE) ×2 IMPLANT
DRESSING AQUACEL AG ADV 3.5X12 (MISCELLANEOUS) ×1 IMPLANT
DRSG AQUACEL AG ADV 3.5X12 (MISCELLANEOUS) ×2
DURAPREP 26ML APPLICATOR (WOUND CARE) ×4 IMPLANT
ELECT REM PT RETURN 9FT ADLT (ELECTROSURGICAL) ×2
ELECTRODE REM PT RTRN 9FT ADLT (ELECTROSURGICAL) ×1 IMPLANT
GLOVE BIO SURGEON STRL SZ7 (GLOVE) ×1 IMPLANT
GLOVE BIOGEL PI IND STRL 7.0 (GLOVE) IMPLANT
GLOVE BIOGEL PI IND STRL 8.5 (GLOVE) IMPLANT
GLOVE BIOGEL PI INDICATOR 7.0 (GLOVE) ×3
GLOVE BIOGEL PI INDICATOR 8.5 (GLOVE) ×1
GLOVE ECLIPSE 6.5 STRL STRAW (GLOVE) ×1 IMPLANT
GLOVE SS N UNI LF 8.5 STRL (GLOVE) ×2 IMPLANT
GLOVE SURG SS PI 7.0 STRL IVOR (GLOVE) ×2 IMPLANT
GLOVE SURG SS PI 8.0 STRL IVOR (GLOVE) ×2 IMPLANT
GOWN STRL REUS W/TWL LRG LVL3 (GOWN DISPOSABLE) ×6 IMPLANT
GOWN STRL REUS W/TWL XL LVL3 (GOWN DISPOSABLE) ×2 IMPLANT
HANDPIECE INTERPULSE COAX TIP (DISPOSABLE) ×1
HOOD W/PEELAWAY (MISCELLANEOUS) ×8 IMPLANT
INSERT TIB ATTUNE FB SZ5X12 (Insert) ×1 IMPLANT
INST SET MAJOR BONE (KITS) ×2 IMPLANT
IV NS IRRIG 3000ML ARTHROMATIC (IV SOLUTION) ×2 IMPLANT
KIT BLADEGUARD II DBL (SET/KITS/TRAYS/PACK) ×2 IMPLANT
KIT TURNOVER KIT A (KITS) ×2 IMPLANT
MANIFOLD NEPTUNE II (INSTRUMENTS) ×2 IMPLANT
MARKER SKIN DUAL TIP RULER LAB (MISCELLANEOUS) ×2 IMPLANT
NS IRRIG 1000ML POUR BTL (IV SOLUTION) ×2 IMPLANT
PACK TOTAL JOINT (CUSTOM PROCEDURE TRAY) ×2 IMPLANT
PAD ARMBOARD 7.5X6 YLW CONV (MISCELLANEOUS) ×2 IMPLANT
PAD COLD SHLDR WRAP-ON (PAD) ×2 IMPLANT
PILLOW KNEE EXTENSION 0 DEG (MISCELLANEOUS) ×2 IMPLANT
SAW OSC TIP CART 19.5X105X1.3 (SAW) ×2 IMPLANT
SET BASIN LINEN APH (SET/KITS/TRAYS/PACK) ×2 IMPLANT
SET HNDPC FAN SPRY TIP SCT (DISPOSABLE) ×1 IMPLANT
SPONGE T-LAP 18X18 ~~LOC~~+RFID (SPONGE) ×4 IMPLANT
STAPLER VISISTAT 35W (STAPLE) ×2 IMPLANT
SUT BRALON NAB BRD #1 30IN (SUTURE) ×3 IMPLANT
SUT MNCRL 0 VIOLET CTX 36 (SUTURE) ×1 IMPLANT
SUT MON AB 0 CT1 (SUTURE) ×2 IMPLANT
SUT MONOCRYL 0 CTX 36 (SUTURE) ×1
SYR BULB IRRIG 60ML STRL (SYRINGE) ×2 IMPLANT
TOWEL OR 17X26 4PK STRL BLUE (TOWEL DISPOSABLE) ×2 IMPLANT
TOWER CARTRIDGE SMART MIX (DISPOSABLE) ×2 IMPLANT
TRAY FOLEY MTR SLVR 16FR STAT (SET/KITS/TRAYS/PACK) ×2 IMPLANT
WATER STERILE IRR 1000ML POUR (IV SOLUTION) ×4 IMPLANT
YANKAUER SUCT 12FT TUBE ARGYLE (SUCTIONS) ×2 IMPLANT

## 2022-02-02 NOTE — Anesthesia Preprocedure Evaluation (Signed)
Anesthesia Evaluation  ?Patient identified by MRN, date of birth, ID band ?Patient awake ? ? ? ?Reviewed: ?Allergy & Precautions, NPO status , Patient's Chart, lab work & pertinent test results ? ?Airway ?Mallampati: II ? ?TM Distance: >3 FB ?Neck ROM: Full ? ? ? Dental ? ?(+) Upper Dentures, Edentulous Lower ?  ?Pulmonary ?asthma ,  ?  ?Pulmonary exam normal ?breath sounds clear to auscultation ? ? ? ? ? ? Cardiovascular ?negative cardio ROS ?Normal cardiovascular exam ?Rhythm:Regular Rate:Normal ? ? ?  ?Neuro/Psych ?negative neurological ROS ? negative psych ROS  ? GI/Hepatic ?negative GI ROS, Neg liver ROS,   ?Endo/Other  ?negative endocrine ROS ? Renal/GU ?negative Renal ROS  ?negative genitourinary ?  ?Musculoskeletal ? ?(+) Arthritis , Osteoarthritis,   ? Abdominal ?  ?Peds ?negative pediatric ROS ?(+)  Hematology ?negative hematology ROS ?(+)   ?Anesthesia Other Findings ? ? Reproductive/Obstetrics ?negative OB ROS ? ?  ? ? ? ? ? ? ? ? ? ? ? ? ? ?  ?  ? ? ? ? ? ? ? ?Anesthesia Physical ?Anesthesia Plan ? ?ASA: 2 ? ?Anesthesia Plan: General/Spinal  ? ?Post-op Pain Management: Dilaudid IV and Regional block*  ? ?Induction: Intravenous ? ?PONV Risk Score and Plan: 3 and Ondansetron, Dexamethasone and TIVA ? ?Airway Management Planned: Nasal Cannula, Natural Airway and Simple Face Mask ? ?Additional Equipment:  ? ?Intra-op Plan:  ? ?Post-operative Plan:  ? ?Informed Consent: I have reviewed the patients History and Physical, chart, labs and discussed the procedure including the risks, benefits and alternatives for the proposed anesthesia with the patient or authorized representative who has indicated his/her understanding and acceptance.  ? ? ? ?Dental advisory given ? ?Plan Discussed with: CRNA and Surgeon ? ?Anesthesia Plan Comments: (Possible GA with airway was discussed. )  ? ? ? ? ? ? ?Anesthesia Quick Evaluation ? ?

## 2022-02-02 NOTE — Brief Op Note (Signed)
02/02/2022 ? ?10:50 AM ? ?PATIENT:  Kelly Fox  71 y.o. female ? ?PRE-OPERATIVE DIAGNOSIS:  primary osteoarthritis left knee ? ?POST-OPERATIVE DIAGNOSIS:  primary osteoarthritis left knee ? ?PROCEDURE:  Procedure(s): ?TOTAL KNEE ARTHROPLASTY (Left) ? ?IMPLANT: ATTUNE, FIXED BEARING, POSTERIOR STABILIZED, 5N-FEMUR, 4TIBIA, 12 POLY, 32 X 8.5 PATELLA  ? ?SURGEON:  Surgeon(s) and Role: ?   Carole Civil, MD - Primary ? ?PHYSICIAN ASSISTANT:  ? ?ASSISTANTS: Marquita Palms  ? ?ANESTHESIA:   spinal and saphenous nerve preop block  ? ?EBL:  5 mL  ? ?BLOOD ADMINISTERED:none ? ?DRAINS: none  ? ?LOCAL MEDICATIONS USED:  OTHER zynrelef ? ?SPECIMEN:  No Specimen ? ?DISPOSITION OF SPECIMEN:  N/A ? ?COUNTS:  YES ? ?TOURNIQUET:   ?Total Tourniquet Time Documented: ?Thigh (Left) - 95 minutes ?Total: Thigh (Left) - 95 minutes ? ? ?DICTATION: .Dragon Dictation ? ?PLAN OF CARE: Admit for overnight observation ? ?PATIENT DISPOSITION:  PACU - hemodynamically stable. ?  ?Delay start of Pharmacological VTE agent (>24hrs) due to surgical blood loss or risk of bleeding: yes ? ?

## 2022-02-02 NOTE — Anesthesia Postprocedure Evaluation (Signed)
Anesthesia Post Note ? ?Patient: Camilia Caywood ? ?Procedure(s) Performed: TOTAL KNEE ARTHROPLASTY (Left: Knee) ? ?Patient location during evaluation: Phase II ?Anesthesia Type: Combined General/Spinal ?Level of consciousness: awake and alert ?Pain management: pain level controlled ?Vital Signs Assessment: post-procedure vital signs reviewed and stable ?Respiratory status: spontaneous breathing, nonlabored ventilation and respiratory function stable ?Cardiovascular status: blood pressure returned to baseline and stable ?Postop Assessment: no apparent nausea or vomiting ?Anesthetic complications: no ? ? ?No notable events documented. ? ? ?Last Vitals:  ?Vitals:  ? 02/02/22 1115 02/02/22 1130  ?BP: 133/78 (!) 147/79  ?Pulse: 69 80  ?Resp: 18 14  ?Temp:    ?SpO2: 100% 99%  ?  ?Last Pain:  ?Vitals:  ? 02/02/22 1054  ?TempSrc:   ?PainSc: 0-No pain  ? ? ?  ?  ?  ?  ?  ?  ? ?Siani Utke C [place] ? ? ? ? ?

## 2022-02-02 NOTE — Anesthesia Procedure Notes (Signed)
Spinal ? ?Patient location during procedure: OR ?Start time: 02/02/2022 8:30 AM ?End time: 02/02/2022 8:34 AM ?Reason for block: surgical anesthesia ?Staffing ?Performed: resident/CRNA  ?Anesthesiologist: Denese Killings, MD ?Resident/CRNA: Maude Leriche, CRNA ?Preanesthetic Checklist ?Completed: patient identified, IV checked, site marked, risks and benefits discussed, surgical consent, monitors and equipment checked, pre-op evaluation and timeout performed ?Spinal Block ?Patient position: sitting ?Prep: ChloraPrep ?Patient monitoring: heart rate, cardiac monitor, continuous pulse ox and blood pressure ?Approach: midline ?Location: L3-4 ?Injection technique: single-shot ?Needle ?Needle type: Pencan  ?Needle gauge: 24 G ?Needle length: 9 cm ?Assessment ?Sensory level: T4 ?Events: CSF return ?Additional Notes ?IV functioning, monitors applied to pt. Expiration date of kit checked and confirmed to be in date. Sterile prep and drape, hand hygiene and sterile gloved used. Pt was positioned and spine was prepped in sterile fashion. Skin was anesthetized with lidocaine. Free flow of clear CSF obtained prior to injecting local anesthetic into CSF x 1 attempt. Spinal needle aspirated freely following injection. Needle was carefully withdrawn, and pt tolerated procedure well. Loss of motor and sensory on exam post injection.  ? ? ? ?

## 2022-02-02 NOTE — Anesthesia Procedure Notes (Signed)
Anesthesia Regional Block: Adductor canal block  ? ?Pre-Anesthetic Checklist: , timeout performed,  Correct Patient, Correct Site, Correct Laterality,  Correct Procedure, Correct Position, site marked,  Risks and benefits discussed,  Surgical consent,  Pre-op evaluation,  At surgeon's request and post-op pain management ? ?Laterality: Left and Lower ? ?Prep: chloraprep     ?  ?Needles:  ?Injection technique: Single-shot ? ?Needle Type: Echogenic Stimulator Needle   ? ? ?Needle Length: 10cm  ?Needle Gauge: 20  ? ?Needle insertion depth: 8 cm ? ? ?Additional Needles: ? ? ?Procedures:,,,, ultrasound used (permanent image in chart),,    ? ?Nerve Stimulator or Paresthesia:  ?Response: no Twitch elicited, 0.6 mA, 8 cm ? ?Additional Responses:  ? ?Narrative:  ?Start time: 02/02/2022 7:39 AM ?End time: 02/02/2022 7:47 AM ?Injection made incrementally with aspirations every 5 mL. ? ?Performed by: Personally  ?Anesthesiologist: Denese Killings, MD ? ?Additional Notes: ?BP cuff, EKG monitors applied. Sedation begun. After nerve location anesthetic injected incrementally, slowly , and after neg aspirations. Tolerated well. ? ? ? ?

## 2022-02-02 NOTE — Plan of Care (Signed)
?  Problem: Acute Rehab PT Goals(only PT should resolve) ?Goal: Pt Will Go Supine/Side To Sit ?Outcome: Progressing ?Flowsheets (Taken 02/02/2022 1608) ?Pt will go Supine/Side to Sit: ? Independently ? with modified independence ?Goal: Patient Will Transfer Sit To/From Stand ?Outcome: Progressing ?Flowsheets (Taken 02/02/2022 1608) ?Patient will transfer sit to/from stand: with modified independence ?Goal: Pt Will Transfer Bed To Chair/Chair To Bed ?Outcome: Progressing ?Flowsheets (Taken 02/02/2022 1608) ?Pt will Transfer Bed to Chair/Chair to Bed: with modified independence ?Goal: Pt Will Ambulate ?Outcome: Progressing ?Flowsheets (Taken 02/02/2022 1608) ?Pt will Ambulate: ? > 125 feet ? with modified independence ? with rolling walker ?  ?4:09 PM, 02/02/22 ?Lonell Grandchild, MPT ?Physical Therapist with [place] ?[facility] ?[phone] office ?[phone] mobile phone ? ?

## 2022-02-02 NOTE — Evaluation (Signed)
Physical Therapy Evaluation ?Patient Details ?Name: Kelly Fox ?MRN: [id number] ?DOB: 07-04-1951 ?Today's Date: 02/02/2022 ? ? ?LEFT KNEE ROM:  0 - 113 degrees ?AMBULATION DISTANCE: 120 feet using RW with Mod Independent/Supervision ? ? ?History of Present Illness ? Kelly Fox is a 71 y/o female, s/p Left TKA on 02/02/22  with the diagnosis of primary osteoarthritis left knee.  ?Clinical Impression ? Patient instructed in and given written instructions for HEP with good carryover demonstrated, good return for moving LLE during bed mobility, verbal cues for proper hand placement using RW for sit to stands and fair/good return for left heel to toe stepping during ambulation in room/hallway without loss of balance.  Patient tolerated sitting up in chair after therapy with LLE dangling.  Patient will benefit from continued skilled physical therapy in hospital and recommended venue below to increase strength, balance, endurance for safe ADLs and gait.  ?   ?   ? ?Recommendations for follow up therapy are one component of a multi-disciplinary discharge planning process, led by the attending physician.  Recommendations may be updated based on patient status, additional functional criteria and insurance authorization. ? ?Follow Up Recommendations Home health PT ? ?  ?Assistance Recommended at Discharge Set up Supervision/Assistance  ?Patient can return home with the following ? A little help with walking and/or transfers;A little help with bathing/dressing/bathroom;Help with stairs or ramp for entrance;Assistance with cooking/housework ? ?  ?Equipment Recommendations None recommended by PT  ?Recommendations for Other Services ?    ?  ?Functional Status Assessment Patient has had a recent decline in their functional status and demonstrates the ability to make significant improvements in function in a reasonable and predictable amount of time.  ? ?  ?Precautions / Restrictions Precautions ?Precautions:  Fall ?Restrictions ?Weight Bearing Restrictions: Yes ?LLE Weight Bearing: Weight bearing as tolerated  ? ?  ? ?Mobility ? Bed Mobility ?Overal bed mobility: Modified Independent ?  ?  ?  ?  ?  ?  ?General bed mobility comments: good return for moving LLE during bed mobility ?  ? ?Transfers ?Overall transfer level: Needs assistance ?Equipment used: Rolling walker (2 wheels) ?Transfers: Sit to/from Stand, Bed to chair/wheelchair/BSC ?Sit to Stand: Modified independent (Device/Increase time), Supervision ?  ?Step pivot transfers: Modified independent (Device/Increase time), Supervision ?  ?  ?  ?General transfer comment: slightly labored movement ?  ? ?Ambulation/Gait ?Ambulation/Gait assistance: Modified independent (Device/Increase time), Supervision ?Gait Distance (Feet): 120 Feet ?Assistive device: Rolling walker (2 wheels) ?Gait Pattern/deviations: Decreased step length - right, Decreased step length - left, Decreased stance time - left, Decreased stride length ?Gait velocity: decreased ?  ?  ?General Gait Details: fair/good return for ambulation in room and hallway without loss of balance with fair/good carryover for left heel to toe stepping ? ?Stairs ?  ?  ?  ?  ?  ? ?Wheelchair Mobility ?  ? ?Modified Rankin (Stroke Patients Only) ?  ? ?  ? ?Balance Overall balance assessment: Needs assistance ?Sitting-balance support: Feet supported, No upper extremity supported ?Sitting balance-Leahy Scale: Good ?Sitting balance - Comments: seated at EOB ?  ?Standing balance support: During functional activity, Bilateral upper extremity supported ?Standing balance-Leahy Scale: Fair ?Standing balance comment: fair/good using RW ?  ?  ?  ?  ?  ?  ?  ?  ?  ?  ?  ?   ? ? ? ?Pertinent Vitals/Pain Pain Assessment ?Pain Assessment: Faces ?Faces Pain Scale: Hurts a little bit ?Pain Location: distal thigh area  above knee, otherwise no c/o pain in knee ?Pain Descriptors / Indicators: Sore ?Pain Intervention(s): Limited activity within  patient's tolerance, Monitored during session  ? ? ?Home Living Family/patient expects to be discharged to:: Private residence ?Living Arrangements: Spouse/significant other ?Available Help at Discharge: Family;Available 24 hours/day ?Type of Home: House ?Home Access: Stairs to enter ?Entrance Stairs-Rails: Right;Left;Can reach both ?Entrance Stairs-Number of Steps: 4 ?  ?Home Layout: One level ?Home Equipment: Rolling Walker (2 wheels);BSC/3in1;Grab bars - tub/shower ?   ?  ?Prior Function Prior Level of Function : Independent/Modified Independent ?  ?  ?  ?  ?  ?  ?Mobility Comments: Community ambulator without AD, drives ?ADLs Comments: Independent ?  ? ? ?Hand Dominance  ? Dominant Hand: Left ? ?  ?Extremity/Trunk Assessment  ? Upper Extremity Assessment ?Upper Extremity Assessment: Overall WFL for tasks assessed ?  ? ?Lower Extremity Assessment ?Lower Extremity Assessment: Overall WFL for tasks assessed;LLE deficits/detail ?RLE Deficits / Details: grossly 5/5 ?RLE Sensation: WNL ?RLE Coordination: WNL ?LLE Deficits / Details: grossly 4+/5 ?LLE Sensation: WNL ?LLE Coordination: WNL ?  ? ?   ?Communication  ? Communication: No difficulties  ?Cognition Arousal/Alertness: Awake/alert ?Behavior During Therapy: [facility] for tasks assessed/performed ?Overall Cognitive Status: Within Functional Limits for tasks assessed ?  ?  ?  ?  ?  ?  ?  ?  ?  ?  ?  ?  ?  ?  ?  ?  ?  ?  ?  ? ?  ?General Comments   ? ?  ?Exercises Total Joint Exercises ?Ankle Circles/Pumps: Supine, 10 reps, Left, Strengthening, AROM ?Quad Sets: AROM, Strengthening, Left, 10 reps, Supine ?Short Arc Quad: Supine, 10 reps, Left, Strengthening, AROM ?Heel Slides: AROM, Strengthening, Left, 10 reps, Supine  ? ?Assessment/Plan  ?  ?PT Assessment Patient needs continued PT services  ?PT Problem List Decreased strength;Decreased range of motion;Decreased activity tolerance;Decreased balance ? ?   ?  ?PT Treatment Interventions DME instruction;Gait training;Stair  training;Functional mobility training;Therapeutic activities;Therapeutic exercise;Patient/family education;Balance training   ? ?PT Goals (Current goals can be found in the Care Plan section)  ?Acute Rehab PT Goals ?Patient Stated Goal: return home with family to assist ?PT Goal Formulation: With patient ?Time For Goal Achievement: 02/03/22 ?Potential to Achieve Goals: Good ? ?  ?Frequency BID ?  ? ? ?Co-evaluation   ?  ?  ?  ?  ? ? ?  ?AM-PAC PT "6 Clicks" Mobility  ?Outcome Measure Help needed turning from your back to your side while in a flat bed without using bedrails?: None ?Help needed moving from lying on your back to sitting on the side of a flat bed without using bedrails?: None ?Help needed moving to and from a bed to a chair (including a wheelchair)?: A Little ?Help needed standing up from a chair using your arms (e.g., wheelchair or bedside chair)?: A Little ?Help needed to walk in hospital room?: A Little ?Help needed climbing 3-5 steps with a railing? : A Little ?6 Click Score: 20 ? ?  ?End of Session   ?Activity Tolerance: Patient tolerated treatment well;Patient limited by fatigue ?Patient left: in chair;with call bell/phone within reach ?Nurse Communication: Mobility status ?PT Visit Diagnosis: Unsteadiness on feet (R26.81);Other abnormalities of gait and mobility (R26.89);Muscle weakness (generalized) (M62.81) ?  ? ?Time: 1610-9604 ?PT Time Calculation (min) (ACUTE ONLY): 30 min ? ? ?Charges:   PT Evaluation ?$PT Eval Moderate Complexity: 1 Mod ?PT Treatments ?$Therapeutic Activity: 23-37 mins ?  ?   ? ? ?  4:07 PM, 02/02/22 ?Lonell Grandchild, MPT ?Physical Therapist with [place] ?[facility] ?[phone] office ?[phone] mobile phone ? ? ?

## 2022-02-02 NOTE — Op Note (Signed)
02/02/2022 ? ?10:50 AM ? ?PATIENT:  Kelly Fox  71 y.o. female ? ?PRE-OPERATIVE DIAGNOSIS:  primary osteoarthritis left knee ? ?POST-OPERATIVE DIAGNOSIS:  primary osteoarthritis left knee ? ?PROCEDURE:  Procedure(s): ?TOTAL KNEE ARTHROPLASTY (Left) ? ?IMPLANT: ATTUNE, FIXED BEARING, POSTERIOR STABILIZED, 5N-FEMUR, 4TIBIA, 12 POLY, 32 X 8.5 PATELLA  ? ?SURGEON:  Surgeon(s) and Role: ?   Carole Civil, MD - Primary ? ?PHYSICIAN ASSISTANT:  ? ?ASSISTANTS: Marquita Palms  ? ?ANESTHESIA:   spinal and saphenous nerve preop block  ? ?EBL:  5 mL  ? ?BLOOD ADMINISTERED:none ? ?DRAINS: none  ? ?LOCAL MEDICATIONS USED:  OTHER zynrelef ? ?SPECIMEN:  No Specimen ? ?DISPOSITION OF SPECIMEN:  N/A ? ?COUNTS:  YES ? ?TOURNIQUET:   ?Total Tourniquet Time Documented: ?Thigh (Left) - 95 minutes ?Total: Thigh (Left) - 95 minutes ? ? ?DICTATION: .Dragon Dictation ? ?PLAN OF CARE: Admit for overnight observation ? ?PATIENT DISPOSITION:  PACU - hemodynamically stable. ?  ?Delay start of Pharmacological VTE agent (>24hrs) due to surgical blood loss or risk of bleeding: yes ? ? ?Dictation for total knee replacement ? ?02/02/2022 ? ?10:59 AM ? ?Bone cuts:  ?Distal femur  11 ? ?PROXIMAL TIBIA 3 MM FROM MEDIAL SIDE  ? ?PATELLA 19-9=10      ? ? ? ?ASSISTANTS: CYNTHIA WRENN  ? ?ANESTHESIA:   SPINAL AND SAPHENOUS N BLOCK PRE OP  ? ? ?Details of surgery: ?The patient was identified by 2 approved identification mechanisms. The operative extremity was evaluated and found to be acceptable for surgical treatment today. The chart was reviewed. The surgical site was confirmed and marked. ?The patient had a preop saphenous nerve block PREOP ? ?The patient was taken to the operating room and given appropriate antibiotic 2 GM ANCEF . This is consistent with the SCIP protocol. ? ?The patient was given the following anesthetic: SPINAL  ? ?The patient was then placed supine on the operating table. A Foley catheter was inserted.  ? ?FINDINGS: ?GRADE 4  OA MEDIAL FEMUR AND TIBIA ? ?GRADE 3 PATELLA AND TROCHLEA ? ?GRADE 1 LATERAL  ? ?MEDIAL AND LATERAL MENISCI INTACT  ? ?PCL INTACT  ? ?ACL SMALL NUT INTACT  ? ?PREOP 12 DEGREE FLEXION CONTRACTURE  ? ?PREOP FLEXION 95  ? ?MODERATE FIXED VARUS DEFORMITY  ? ?The operative extremity was prepped and draped sterilely from the toes to the groin. ? ?Timeout was executed confirming the patient's name, surgical site, antibiotic administration, x-rays available, and implants available. ? ?The operative limb,  was exsanguinated with a six-inch Esmarch and the tourniquet was inflated to 275 mmHg. ? ?A straight midline incision was made over the LEFT  KNEE and taken down to the extensor mechanism. A medial arthrotomy was performed. The patella was everted and the patellofemoral soft tissue was released, along with the patellar fat pad.  The anterior cruciate ligament and PCL were resected. THE ACL WAS SMALL BUT INTACT, OSTEOPHYTES WERE LARGE AND DIFFUSE THEY WERE REMOVES AS ENCOUNTERED AND SEVERAL LOOSE BODIES WERE REMOVED  ? ? ? ?The anterior horns of the lateral and medial meniscus were resected. The medial soft tissue sleeve was elevated to the mid coronal plane. ? ?A three-eighths inch drill bit was used to enter the femoral canal which was decompressed with suction and irrigation until clear.  ? ?THE TIBIA WAS CUT FIRST  ?The external alignment guide for the tibial resection was then applied to the distal and proximal tibia and set for 3 DEGREE  slope along with  3 MM resection  from the  DEFICIENT MEDIAL  .  ? ?Rotational alignment was set using the malleolus, the tibial tubercle and the tibial spines. ? ?The proximal tibia was resected along with  residual menisci. The tibia was sized using a base plate to a size  4 .  ? ?The distal femoral cutting guide was set for 11 mm distal resection,  5?valgus alignment, for a LEFT knee. The distal femur was resected and checked for flatness. ? ?The Depuy Sigma sizing femoral guide was  placed and the femur was sized to a size 5 .  ? ? ?The extension gap was checked.  A 12 MM SPACER BLOCK WAS REQURED   ? ? ?A 4-in-1 cutting block was placed along with collateral ligament retractors and the distal femoral cuts were completed. ? ?Spacer blocks were used to confirm equal flexion extension gaps with releases done as needed. A size 12 MM spacer block gave equal stability and flexion extension. ? ?The correct sized notch cutting guide for the femur was then applied and the notch cut was made. ? ?Trial reduction was completed using size 34F 4T 12 POLY  trial implants. Patella tracking was normal ? ?We then skeletonized the patella. It measured 19 in thickness and the patellar resection was set for 11 millimeters. the patellar resection was completed. The patella diameter measured 32. We then drilled the peg holes for the patella.  Completed patellar thickness was 10 + 8.5 =18.5  ? ?The proximal tibia was prepared using the size 4 base plate. ? ?Thorough irrigation was performed and the bone was dried and prepared for cement. The cement was mixed on the back table using third generation preparation techniques ? ? ?The implants were then cemented in place and excess cement was removed. The cement was allowed to cure. Irrigation was repeated and excess and residual bone fragments and cement were removed. ? ?ZYNRELEF WAS INJECTED , 2 VIALS  ? ?The extensor mechanism was closed with #1 Bralon suture followed by subcutaneous tissue closure using 0 Monocryl suture IN 2 LAYERS  ? ? ?Skin approximation was performed using staples ? ?A sterile dressing was applied, followed by a ace wrapped from foot to thigh and then a Cryo/Cuff which was activated  ? ?The patient was taken recovery room in stable condition ? ?  ?

## 2022-02-02 NOTE — Interval H&P Note (Signed)
History and Physical Interval Note: ? ?02/02/2022 ?8:19 AM ? ?Kelly Fox  has presented today for surgery, with the diagnosis of primary osteoarthritis left knee.  The various methods of treatment have been discussed with the patient and family. After consideration of risks, benefits and other options for treatment, the patient has consented to  Procedure(s): ?TOTAL KNEE ARTHROPLASTY (Left) as a surgical intervention.  The patient's history has been reviewed, patient examined, no change in status, stable for surgery.  I have reviewed the patient's chart and labs.  Questions were answered to the patient's satisfaction.   ? ? ?[name] ? ? ?

## 2022-02-02 NOTE — Transfer of Care (Signed)
Immediate Anesthesia Transfer of Care Note ? ?Patient: Kelly Fox ? ?Procedure(s) Performed: TOTAL KNEE ARTHROPLASTY (Left: Knee) ? ?Patient Location: PACU ? ?Anesthesia Type:Spinal and GA combined with regional for post-op pain ? ?Level of Consciousness: awake, alert  and oriented ? ?Airway & Oxygen Therapy: Patient Spontanous Breathing ? ?Post-op Assessment: Report given to RN, Post -op Vital signs reviewed and stable, Patient moving all extremities X 4 and Patient able to stick tongue midline ? ?Post vital signs: Reviewed ? ?Last Vitals:  ?Vitals Value Taken Time  ?BP 124/65   ?Temp 97.6   ?Pulse 73 02/02/22 1054  ?Resp 14 02/02/22 1054  ?SpO2 94 % 02/02/22 1054  ?Vitals shown include unvalidated device data. ? ?Last Pain:  ?Vitals:  ? 02/02/22 0714  ?TempSrc: Oral  ?PainSc: 7   ?   ? ?Patients Stated Pain Goal: 5 (02/02/22 8022) ? ?Complications: No notable events documented. ?

## 2022-02-03 DIAGNOSIS — J45909 Unspecified asthma, uncomplicated: Secondary | ICD-10-CM | POA: Diagnosis not present

## 2022-02-03 DIAGNOSIS — G8929 Other chronic pain: Secondary | ICD-10-CM | POA: Diagnosis not present

## 2022-02-03 DIAGNOSIS — M1712 Unilateral primary osteoarthritis, left knee: Secondary | ICD-10-CM | POA: Diagnosis not present

## 2022-02-03 DIAGNOSIS — M6281 Muscle weakness (generalized): Secondary | ICD-10-CM | POA: Diagnosis not present

## 2022-02-03 DIAGNOSIS — R2689 Other abnormalities of gait and mobility: Secondary | ICD-10-CM | POA: Diagnosis not present

## 2022-02-03 DIAGNOSIS — H538 Other visual disturbances: Secondary | ICD-10-CM | POA: Diagnosis not present

## 2022-02-03 DIAGNOSIS — R2681 Unsteadiness on feet: Secondary | ICD-10-CM | POA: Diagnosis not present

## 2022-02-03 DIAGNOSIS — M25562 Pain in left knee: Secondary | ICD-10-CM | POA: Diagnosis not present

## 2022-02-03 DIAGNOSIS — Z96652 Presence of left artificial knee joint: Secondary | ICD-10-CM | POA: Diagnosis not present

## 2022-02-03 DIAGNOSIS — S8992XS Unspecified injury of left lower leg, sequela: Secondary | ICD-10-CM | POA: Diagnosis not present

## 2022-02-03 LAB — BASIC METABOLIC PANEL
Anion gap: 6 (ref 5–15)
BUN: 15 mg/dL (ref 8–23)
CO2: 25 mmol/L (ref 22–32)
Calcium: 8.8 mg/dL — ABNORMAL LOW (ref 8.9–10.3)
Chloride: 107 mmol/L (ref 98–111)
Creatinine, Ser: 0.54 mg/dL (ref 0.44–1.00)
GFR, Estimated: >60 mL/min (ref >60)
Glucose, Bld: 147 mg/dL — ABNORMAL HIGH (ref 70–99)
Potassium: 4.4 mmol/L (ref 3.5–5.1)
Sodium: 138 mmol/L (ref 135–145)

## 2022-02-03 LAB — CBC
HCT: 27.1 % — ABNORMAL LOW (ref 36.0–46.0)
Hemoglobin: 8.2 g/dL — ABNORMAL LOW (ref 12.0–15.0)
MCH: 24.1 pg — ABNORMAL LOW (ref 26.0–34.0)
MCHC: 30.3 g/dL (ref 30.0–36.0)
MCV: 79.7 fL — ABNORMAL LOW (ref 80.0–100.0)
Platelets: 175 10*3/uL (ref 150–400)
RBC: 3.4 MIL/uL — ABNORMAL LOW (ref 3.87–5.11)
RDW: 15.5 % (ref 11.5–15.5)
WBC: 8.5 10*3/uL (ref 4.0–10.5)
nRBC: 0 % (ref 0.0–0.2)

## 2022-02-03 MED ORDER — CELECOXIB 200 MG PO CAPS: 200.0000 mg | ORAL_CAPSULE | Freq: Two times a day (BID) | ORAL | 0 refills | Status: DC

## 2022-02-03 MED ORDER — FERROUS SULFATE 300 (60 FE) MG/5ML PO SYRP: 300.0000 mg | ORAL_SOLUTION | Freq: Three times a day (TID) | ORAL | 0 refills | Status: DC

## 2022-02-03 MED ORDER — METHOCARBAMOL 500 MG PO TABS: 500.0000 mg | ORAL_TABLET | Freq: Four times a day (QID) | ORAL | 1 refills | Status: DC | PRN

## 2022-02-03 MED ORDER — TRAMADOL HCL 50 MG PO TABS: 50.0000 mg | ORAL_TABLET | Freq: Four times a day (QID) | ORAL | 0 refills | Status: AC

## 2022-02-03 MED ORDER — DOCUSATE SODIUM 100 MG PO CAPS: 100.0000 mg | ORAL_CAPSULE | Freq: Two times a day (BID) | ORAL | 0 refills | Status: DC

## 2022-02-03 MED ORDER — HYDROCODONE-ACETAMINOPHEN 10-325 MG PO TABS: 1.0000 | ORAL_TABLET | ORAL | 0 refills | Status: DC | PRN

## 2022-02-03 MED ORDER — ASPIRIN 81 MG PO CHEW: 81.0000 mg | CHEWABLE_TABLET | Freq: Two times a day (BID) | ORAL | 0 refills | Status: DC

## 2022-02-03 MED ORDER — POLYETHYLENE GLYCOL 3350 17 G PO PACK
17.0000 g | PACK | Freq: Every day | ORAL | 0 refills | Status: DC
Start: 2022-02-03 — End: 2022-09-02

## 2022-02-03 NOTE — Progress Notes (Signed)
Physical Therapy Treatment ?Patient Details ?Name: Kelly Fox ?MRN: [id number] ?DOB: 14-Apr-1951 ?Today's Date: 02/03/2022 ? ? ?LEFT KNEE ROM:  0 - 115 degrees ?AMBULATION DISTANCE: 175 feet using RW with Mod Independent ? ? 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 ? ?  ?PT Comments  ? ? Patient demonstrates good return for going up/down 4 steps using bilateral side rails without loss of balance and understanding acknowledged for proper step sequence and position for helper, increased endurance/distance for gait training with good return for left heel to toe stepping and tolerated sitting up in chair after therapy with LLE dangling.  Patient will benefit from continued skilled physical therapy in hospital and recommended venue below to increase strength, balance, endurance for safe ADLs and gait.  ?  ?Recommendations for follow up therapy are one component of a multi-disciplinary discharge planning process, led by the attending physician.  Recommendations may be updated based on patient status, additional functional criteria and insurance authorization. ? ?Follow Up Recommendations ? Home health PT ?  ?  ?Assistance Recommended at Discharge Set up Supervision/Assistance  ?Patient can return home with the following A little help with walking and/or transfers;A little help with bathing/dressing/bathroom;Help with stairs or ramp for entrance;Assistance with cooking/housework ?  ?Equipment Recommendations ? None recommended by PT  ?  ?Recommendations for Other Services   ? ? ?  ?Precautions / Restrictions Precautions ?Precautions: Fall ?Restrictions ?Weight Bearing Restrictions: Yes ?LLE Weight Bearing: Weight bearing as tolerated  ?  ? ?Mobility ? Bed Mobility ?Overal bed mobility: Modified Independent ?  ?  ?  ?  ?  ?  ?General bed mobility comments: good return for moving LLE during bed mobility ?   ? ?Transfers ?Overall transfer level: Modified independent ?  ?  ?  ?  ?  ?  ?  ?  ?General transfer comment: good return for completing sit to stands from bedside ?  ? ?Ambulation/Gait ?Ambulation/Gait assistance: Modified independent (Device/Increase time) ?Gait Distance (Feet): 175 Feet ?Assistive device: Rolling walker (2 wheels) ?Gait Pattern/deviations: Decreased step length - right, Decreased step length - left, Decreased stance time - left, Decreased stride length ?Gait velocity: decreased ?  ?  ?General Gait Details: increased endurance/distance for ambulation with slightly labored cadence and good return for left heel to toe stepping without loss of balance ? ? ?Stairs ?Stairs: Yes ?Stairs assistance: Modified independent (Device/Increase time), Supervision ?Stair Management: Two rails, Step to pattern ?Number of Stairs: 4 ?General stair comments: demonstrates good return for going up/down 4 steps using bilateral siderails without loss of balance ? ? ?Wheelchair Mobility ?  ? ?Modified Rankin (Stroke Patients Only) ?  ? ? ?  ?Balance Overall balance assessment: Needs assistance ?Sitting-balance support: Feet supported, No upper extremity supported ?Sitting balance-Leahy Scale: Good ?Sitting balance - Comments: seated at EOB ?  ?Standing balance support: During functional activity, Bilateral upper extremity supported ?Standing balance-Leahy Scale: Good ?Standing balance comment: using RW ?  ?  ?  ?  ?  ?  ?  ?  ?  ?  ?  ?  ? ?  ?Cognition Arousal/Alertness: Awake/alert ?Behavior During Therapy: [facility] for tasks assessed/performed ?Overall Cognitive Status: Within Functional Limits for tasks assessed ?  ?  ?  ?  ?  ?  ?  ?  ?  ?  ?  ?  ?  ?  ?  ?  ?  ?  ?  ? ?  ?  Exercises Total Joint Exercises ?Short Arc Quad: Supine, 10 reps, Left, Strengthening, AROM ? ?  ?General Comments   ?  ?  ? ?Pertinent Vitals/Pain Pain Assessment ?Pain Assessment: 0-10 ?Pain Score: 1  ?Pain Location: left knee ?Pain Descriptors /  Indicators: Sore ?Pain Intervention(s): Limited activity within patient's tolerance, Monitored during session, Repositioned  ? ? ?Home Living   ?  ?  ?  ?  ?  ?  ?  ?  ?  ?   ?  ?Prior Function    ?  ?  ?   ? ?PT Goals (current goals can now be found in the care plan section) Acute Rehab PT Goals ?Patient Stated Goal: return home with family to assist ?PT Goal Formulation: With patient ?Time For Goal Achievement: 02/03/22 ?Potential to Achieve Goals: Good ?Progress towards PT goals: Progressing toward goals ? ?  ?Frequency ? ? ? BID ? ? ? ?  ?PT Plan    ? ? ?Co-evaluation   ?  ?  ?  ?  ? ?  ?AM-PAC PT "6 Clicks" Mobility   ?Outcome Measure ? Help needed turning from your back to your side while in a flat bed without using bedrails?: None ?Help needed moving from lying on your back to sitting on the side of a flat bed without using bedrails?: None ?Help needed moving to and from a bed to a chair (including a wheelchair)?: None ?Help needed standing up from a chair using your arms (e.g., wheelchair or bedside chair)?: None ?Help needed to walk in hospital room?: A Little ?Help needed climbing 3-5 steps with a railing? : A Little ?6 Click Score: 22 ? ?  ?End of Session   ?Activity Tolerance: Patient tolerated treatment well;Patient limited by fatigue ?Patient left: in chair;with call bell/phone within reach ?Nurse Communication: Mobility status ?PT Visit Diagnosis: Unsteadiness on feet (R26.81);Other abnormalities of gait and mobility (R26.89);Muscle weakness (generalized) (M62.81) ?  ? ? ?Time: 1017-5102 ?PT Time Calculation (min) (ACUTE ONLY): 27 min ? ?Charges:  $Gait Training: 8-22 mins ?$Therapeutic Activity: 8-22 mins          ?          ? ?9:15 AM, 02/03/22 ?Lonell Grandchild, MPT ?Physical Therapist with [place] ?[facility] ?[phone] office ?[phone] mobile phone ? ? ?

## 2022-02-03 NOTE — Progress Notes (Signed)
Nsg Discharge Note ? ?Admit Date:  02/02/2022 ?Discharge date: 02/03/2022 ?  ?Nile Riggs to be D/C'd Home per MD order.  AVS completed.  Copy for chart, and copy for patient signed, and dated. ?Patient/caregiver able to verbalize understanding. ? ?Discharge Medication: ?Allergies as of 02/03/2022   ? ?   Reactions  ? Contrast Media [iodinated Contrast Media] Swelling, Rash  ? ?  ? ?  ?Medication List  ?  ? ?TAKE these medications   ? ?albuterol 108 (90 Base) MCG/ACT inhaler ?Commonly known as: VENTOLIN HFA ?Inhale 2 puffs into the lungs every 6 (six) hours as needed for shortness of breath or wheezing. ?  ?aspirin 81 MG chewable tablet ?Chew 1 tablet (81 mg total) by mouth 2 (two) times daily. ?  ?celecoxib 200 MG capsule ?Commonly known as: CELEBREX ?Take 1 capsule (200 mg total) by mouth 2 (two) times daily. ?  ?docusate sodium 100 MG capsule ?Commonly known as: COLACE ?Take 1 capsule (100 mg total) by mouth 2 (two) times daily. ?  ?ferrous sulfate 300 (60 Fe) MG/5ML syrup ?Take 5 mLs (300 mg total) by mouth 3 (three) times daily with meals for 14 days. ?  ?HYDROcodone-acetaminophen 10-325 MG tablet ?Commonly known as: Norco ?Take 1 tablet by mouth every 4 (four) hours as needed. ?  ?methocarbamol 500 MG tablet ?Commonly known as: ROBAXIN ?Take 1 tablet (500 mg total) by mouth every 6 (six) hours as needed for muscle spasms. ?  ?polyethylene glycol 17 g packet ?Commonly known as: MIRALAX / GLYCOLAX ?Take 17 g by mouth daily. ?  ?traMADol 50 MG tablet ?Commonly known as: ULTRAM ?Take 1 tablet (50 mg total) by mouth every 6 (six) hours for 7 days. ?  ?trolamine salicylate 10 % cream ?Commonly known as: ASPERCREME ?Apply 1 application. topically as needed for muscle pain. ?  ? ?  ? ? ?Discharge Assessment: ?Vitals:  ? 02/02/22 2140 02/03/22 0543  ?BP: 120/63 134/75  ?Pulse: 66 66  ?Resp: 19 20  ?Temp: 97.6 ?F (36.4 ?C) 97.9 ?F (36.6 ?C)  ?SpO2: 97% 97%  ? Skin clean, dry and intact without evidence of skin break  down, no evidence of skin tears noted. ?IV catheter discontinued intact. Site without signs and symptoms of complications - no redness or edema noted at insertion site, patient denies c/o pain - only slight tenderness at site.  Dressing with slight pressure applied. ? ?D/c Instructions-Education: ?Discharge instructions given to patient/family with verbalized understanding. ?D/c education completed with patient/family including follow up instructions, medication list, d/c activities limitations if indicated, with other d/c instructions as indicated by MD - patient able to verbalize understanding, all questions fully answered. ?Patient instructed to return to ED, call 911, or call MD for any changes in condition.  ?Patient escorted via [place], and D/C home via private auto. ? ?Dorcas Mcmurray, RN ?02/03/2022 10:35 AM  ?

## 2022-02-03 NOTE — Discharge Summary (Signed)
?Physician Discharge Summary  ?Patient ID: ?Kelly Fox ?MRN: [id number] ?DOB/AGE: 1950-12-20 71 y.o. ? ?Admit date: 02/02/2022 ?Discharge date: 02/03/2022 ? ?Admission Diagnoses: Osteoarthritis left knee ? ?Discharge Diagnoses: Same ? ?Discharged Condition: Stable ? ?Procedure: Left total knee. ? ?Implants DePuy attune fixed-bearing posterior stabilized 5 narrow femur 4 tibia 12 polyethylene insert 32 patella ? ?Hospital Course: ? ?The patient had uncomplicated left total knee on April 11.  She had a preop saphenous nerve block and a spinal anesthetic.  We injected zynrelef Intra-Op.  She walked 120 feet had 110+ range of motion  ? ?Postop day 1 she was afebrile she was noted to have vital signs stable neurovascular intact scant drainage no evidence of DVT ? ? ?  Latest Ref Rng & Units 02/03/2022  ?  4:49 AM 02/01/2022  ?  8:47 AM  ?CBC  ?WBC 4.0 - 10.5 K/uL 8.5   4.7    ?Hemoglobin 12.0 - 15.0 g/dL 8.2   10.9    ?Hematocrit 36.0 - 46.0 % 27.1   35.8    ?Platelets 150 - 400 K/uL 175   248    ? ? ?  Latest Ref Rng & Units 02/03/2022  ?  4:49 AM 02/01/2022  ?  8:47 AM  ?BMP  ?Glucose 70 - 99 mg/dL 147   105    ?BUN 8 - 23 mg/dL 15   13    ?Creatinine 0.44 - 1.00 mg/dL 0.54   0.66    ?Sodium 135 - 145 mmol/L 138   139    ?Potassium 3.5 - 5.1 mmol/L 4.4   4.1    ?Chloride 98 - 111 mmol/L 107   104    ?CO2 22 - 32 mmol/L 25   28    ?Calcium 8.9 - 10.3 mg/dL 8.8   9.3    ? ? ? ?Disposition: Discharge disposition: 01-Home or Self Care ? ? ? ? ? ? ?Discharge Instructions   ? ? Call MD / Call 911   Complete by: As directed ?  ? If you experience chest pain or shortness of breath, CALL 911 and be transported to the hospital emergency room.  If you develope a fever above 101 F, pus (white drainage) or increased drainage or redness at the wound, or calf pain, call your surgeon's office.  ? Constipation Prevention   Complete by: As directed ?  ? Drink plenty of fluids.  Prune juice may be helpful.  You may use a stool softener, such  as Colace (over the counter) 100 mg twice a day.  Use MiraLax (over the counter) for constipation as needed.  ? Diet - low sodium heart healthy   Complete by: As directed ?  ? Discharge instructions   Complete by: As directed ?  ? 1.  Blue bone foam used 30 minutes 4 times a day ? ?2 CPM machine 6 hours a day for 2 weeks, you can divide the hours up however you like ? ?3 white stockings for weeks ? ?4 aspirin 4 weeks ? ?5 After 1 week you can remove the stockings for the shower.  Do not remove the dressing.  It is waterproof.  Wrap the leg with Saran wrap and you can take a shower with assistance ? ?6 driving you can drive after 1 week  ? Increase activity slowly as tolerated   Complete by: As directed ?  ? Post-operative opioid taper instructions:   Complete by: As directed ?  ? POST-OPERATIVE OPIOID TAPER INSTRUCTIONS: ?It  is important to wean off of your opioid medication as soon as possible. If you do not need pain medication after your surgery it is ok to stop day one. ?Opioids include: ?Codeine, Hydrocodone(Norco, Vicodin), Oxycodone(Percocet, oxycontin) and hydromorphone amongst others.  ?Long term and even short term use of opiods can cause: ?Increased pain response ?Dependence ?Constipation ?Depression ?Respiratory depression ?And more.  ?Withdrawal symptoms can include ?Flu like symptoms ?Nausea, vomiting ?And more ?Techniques to manage these symptoms ?Hydrate well ?Eat regular healthy meals ?Stay active ?Use relaxation techniques(deep breathing, meditating, yoga) ?Do Not substitute Alcohol to help with tapering ?If you have been on opioids for less than two weeks and do not have pain than it is ok to stop all together.  ?Plan to wean off of opioids ?This plan should start within one week post op of your joint replacement. ?Maintain the same interval or time between taking each dose and first decrease the dose.  ?Cut the total daily intake of opioids by one tablet each day ?Next start to increase the time  between doses. ?The last dose that should be eliminated is the evening dose.  ? ?  ? ?  ? ?Allergies as of 02/03/2022   ? ?   Reactions  ? Contrast Media [iodinated Contrast Media] Swelling, Rash  ? ?  ? ?  ?Medication List  ?  ? ?TAKE these medications   ? ?albuterol 108 (90 Base) MCG/ACT inhaler ?Commonly known as: VENTOLIN HFA ?Inhale 2 puffs into the lungs every 6 (six) hours as needed for shortness of breath or wheezing. ?  ?aspirin 81 MG chewable tablet ?Chew 1 tablet (81 mg total) by mouth 2 (two) times daily. ?  ?celecoxib 200 MG capsule ?Commonly known as: CELEBREX ?Take 1 capsule (200 mg total) by mouth 2 (two) times daily. ?  ?docusate sodium 100 MG capsule ?Commonly known as: COLACE ?Take 1 capsule (100 mg total) by mouth 2 (two) times daily. ?  ?ferrous sulfate 300 (60 Fe) MG/5ML syrup ?Take 5 mLs (300 mg total) by mouth 3 (three) times daily with meals for 14 days. ?  ?HYDROcodone-acetaminophen 10-325 MG tablet ?Commonly known as: Norco ?Take 1 tablet by mouth every 4 (four) hours as needed. ?  ?methocarbamol 500 MG tablet ?Commonly known as: ROBAXIN ?Take 1 tablet (500 mg total) by mouth every 6 (six) hours as needed for muscle spasms. ?  ?polyethylene glycol 17 g packet ?Commonly known as: MIRALAX / GLYCOLAX ?Take 17 g by mouth daily. ?  ?traMADol 50 MG tablet ?Commonly known as: ULTRAM ?Take 1 tablet (50 mg total) by mouth every 6 (six) hours for 7 days. ?  ?trolamine salicylate 10 % cream ?Commonly known as: ASPERCREME ?Apply 1 application. topically as needed for muscle pain. ?  ? ?  ? ? ? ?Signed: ?[name] ?02/03/2022, 7:52 AM ? ? ?

## 2022-02-03 NOTE — Progress Notes (Signed)
Removed foley catheter without difficulty. Patient tolerated well. ?

## 2022-02-03 NOTE — Progress Notes (Signed)
Kelly Fox is postop day 1 from her left total knee ? ?She is doing well has no complaints her pain is well controlled ? ?Her neurovascular exam is intact she has scant minimal drainage on the dressing.  The calf is soft and supple there is no sign of DVT.  She has a negative Homans' sign ? ?Her hemoglobin did drop to 8 but she is tolerating it well ? ?Pending physical therapy evaluation patient should be discharged today ?

## 2022-02-03 NOTE — TOC Transition Note (Signed)
Transition of Care (TOC) - CM/SW Discharge Note ? ? ?Patient Details  ?Name: Kelly Fox ?MRN: [id number] ?Date of Birth: [birth year] ? ?Transition of Care (TOC) CM/SW Contact:  ?Boneta Lucks, RN ?Phone Number: ?02/03/2022, 8:05 AM ? ? ?Clinical Narrative:   Patient admitted with osteoarthritis of left knee. TOC consulted for DME/HH. Patient is discharging home and agreeable to HHPT. MD aware to order. Marjory Lies with [facility] accepted the referral. Patient states she aready has DME needed. ? ?Final next level of care: [place] ?Barriers to Discharge: Barriers Resolved ? ? ?Patient Goals and CMS Choice ?Patient states their goals for this hospitalization and ongoing recovery are:: to go home. ?CMS Medicare.gov Compare Post Acute Care list provided to:: Patient ?Choice offered to / list presented to : Patient ? ?Discharge Placement ?  ?         ?Patient and family notified of of transfer: 02/03/22 ? ?Discharge Plan and Services ?  ?    ?HH Arranged: PT ?[place] Agency: [place] ?Date HH Agency Contacted: 02/03/22 ?Time [place]: 0805 ?Representative spoke with at [place]: Marjory Lies ? ? ?Readmission Risk Interventions ?   ? View : No data to display.  ?  ?  ?  ? ? ? ? ? ?

## 2022-02-04 ENCOUNTER — Encounter (HOSPITAL_COMMUNITY): Payer: Self-pay | Admitting: Orthopedic Surgery

## 2022-02-04 DIAGNOSIS — Z7982 Long term (current) use of aspirin: Secondary | ICD-10-CM | POA: Diagnosis not present

## 2022-02-04 DIAGNOSIS — Z471 Aftercare following joint replacement surgery: Secondary | ICD-10-CM | POA: Diagnosis not present

## 2022-02-04 DIAGNOSIS — Z96652 Presence of left artificial knee joint: Secondary | ICD-10-CM | POA: Diagnosis not present

## 2022-02-04 NOTE — Addendum Note (Signed)
Addendum  created 02/04/22 0930 by Orlie Dakin, CRNA  ? SmartForm saved  ?  ?

## 2022-02-06 DIAGNOSIS — Z7982 Long term (current) use of aspirin: Secondary | ICD-10-CM | POA: Diagnosis not present

## 2022-02-06 DIAGNOSIS — Z96652 Presence of left artificial knee joint: Secondary | ICD-10-CM | POA: Diagnosis not present

## 2022-02-06 DIAGNOSIS — Z471 Aftercare following joint replacement surgery: Secondary | ICD-10-CM | POA: Diagnosis not present

## 2022-02-08 ENCOUNTER — Telehealth: Payer: Self-pay | Admitting: Orthopedic Surgery

## 2022-02-08 DIAGNOSIS — Z7982 Long term (current) use of aspirin: Secondary | ICD-10-CM | POA: Diagnosis not present

## 2022-02-08 DIAGNOSIS — Z471 Aftercare following joint replacement surgery: Secondary | ICD-10-CM | POA: Diagnosis not present

## 2022-02-08 DIAGNOSIS — Z96652 Presence of left artificial knee joint: Secondary | ICD-10-CM | POA: Diagnosis not present

## 2022-02-08 NOTE — Telephone Encounter (Signed)
Please advise. Thank you

## 2022-02-08 NOTE — Telephone Encounter (Signed)
Spoke with patient. Scheduled to come in on Wed for post op per provider. Patient states other than the bruising she is feeling well. Just a little sore.  ?

## 2022-02-08 NOTE — Telephone Encounter (Signed)
Patient called at 1:48 and left a voicemail,  she would like someone to call her back she had surgery and yesterday she starting bruising and today she is bruised REALLY bad on the outside of her left leg.   ? ?Please call her back at [phone] ?

## 2022-02-08 NOTE — Telephone Encounter (Signed)
Abby call her back  ? ?Come in weds

## 2022-02-10 ENCOUNTER — Ambulatory Visit (INDEPENDENT_AMBULATORY_CARE_PROVIDER_SITE_OTHER): Payer: Medicare Other | Admitting: Orthopedic Surgery

## 2022-02-10 DIAGNOSIS — Z7982 Long term (current) use of aspirin: Secondary | ICD-10-CM | POA: Diagnosis not present

## 2022-02-10 DIAGNOSIS — Z471 Aftercare following joint replacement surgery: Secondary | ICD-10-CM | POA: Diagnosis not present

## 2022-02-10 DIAGNOSIS — Z96652 Presence of left artificial knee joint: Secondary | ICD-10-CM

## 2022-02-10 NOTE — Progress Notes (Signed)
FOLLOW UP  ? ?Encounter Diagnosis  ?Name Primary?  ? 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 ? ? ?Chief Complaint  ?Patient presents with  ? Post-op Follow-up  ?  TKA (LEFT) DOS 02/02/22  ? ? ? ?Kelly Fox was brought in 1 week early she was having some swelling and discoloration to her left leg ? ?We brought her in her incision looks great she can bend her knee about 100 degrees she has active extension only to about 30 degrees but I can passively stretch her out and down to about 5 degrees short of full extension ? ?She has a lot of bruising and swelling on the lateral leg there are no signs of DVT ? ?Some of the bruising goes into the foot ? ?She is on hydrocodone methocarbamol tramadol aspirin ? ?We placed a clear dressing over the incision and told her to come in in a week to use ice and elevation.  No concerns here so far ?

## 2022-02-12 DIAGNOSIS — Z471 Aftercare following joint replacement surgery: Secondary | ICD-10-CM | POA: Diagnosis not present

## 2022-02-12 DIAGNOSIS — Z96652 Presence of left artificial knee joint: Secondary | ICD-10-CM | POA: Diagnosis not present

## 2022-02-12 DIAGNOSIS — Z7982 Long term (current) use of aspirin: Secondary | ICD-10-CM | POA: Diagnosis not present

## 2022-02-15 DIAGNOSIS — Z7982 Long term (current) use of aspirin: Secondary | ICD-10-CM | POA: Diagnosis not present

## 2022-02-15 DIAGNOSIS — Z96652 Presence of left artificial knee joint: Secondary | ICD-10-CM | POA: Diagnosis not present

## 2022-02-15 DIAGNOSIS — Z471 Aftercare following joint replacement surgery: Secondary | ICD-10-CM | POA: Diagnosis not present

## 2022-02-17 DIAGNOSIS — Z471 Aftercare following joint replacement surgery: Secondary | ICD-10-CM | POA: Diagnosis not present

## 2022-02-17 DIAGNOSIS — Z7982 Long term (current) use of aspirin: Secondary | ICD-10-CM | POA: Diagnosis not present

## 2022-02-17 DIAGNOSIS — Z96652 Presence of left artificial knee joint: Secondary | ICD-10-CM | POA: Diagnosis not present

## 2022-02-18 ENCOUNTER — Ambulatory Visit (INDEPENDENT_AMBULATORY_CARE_PROVIDER_SITE_OTHER): Payer: Medicare Other | Admitting: Orthopedic Surgery

## 2022-02-18 DIAGNOSIS — Z96652 Presence of left artificial knee joint: Secondary | ICD-10-CM

## 2022-02-18 NOTE — Progress Notes (Signed)
FOLLOW UP  ? ?Encounter Diagnosis  ?Name Primary?  ? 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 ? ? ?Chief Complaint  ?Patient presents with  ? Follow-up  ?  Recheck on left knee, DOS 02-02-22.  ? ? ? ?Postoperative visit status post left total knee complicated by bruising which was quite extensive but is resolving.  Her range of motion is 5 to 100 degrees she has very little pain requiring no opioid therapy she starts outpatient therapy tomorrow she will follow-up in 3 weeks she can stop using the CPM machine.  Staples were removed today. ?

## 2022-02-19 ENCOUNTER — Ambulatory Visit (HOSPITAL_COMMUNITY): Payer: Medicare Other | Attending: Orthopedic Surgery | Admitting: Physical Therapy

## 2022-02-19 ENCOUNTER — Encounter (HOSPITAL_COMMUNITY): Payer: Self-pay | Admitting: Physical Therapy

## 2022-02-19 DIAGNOSIS — G8929 Other chronic pain: Secondary | ICD-10-CM | POA: Diagnosis not present

## 2022-02-19 DIAGNOSIS — M25662 Stiffness of left knee, not elsewhere classified: Secondary | ICD-10-CM | POA: Diagnosis not present

## 2022-02-19 DIAGNOSIS — R2689 Other abnormalities of gait and mobility: Secondary | ICD-10-CM

## 2022-02-19 DIAGNOSIS — M1710 Unilateral primary osteoarthritis, unspecified knee: Secondary | ICD-10-CM | POA: Insufficient documentation

## 2022-02-19 DIAGNOSIS — M25562 Pain in left knee: Secondary | ICD-10-CM | POA: Diagnosis not present

## 2022-02-19 NOTE — Therapy (Signed)
?OUTPATIENT PHYSICAL THERAPY LOWER EXTREMITY EVALUATION ? ? ?Patient Name: Kelly Fox ?MRN: [id number] ?DOB:[birth year], 71 y.o., female ?Today's Date: 02/19/2022 ? ? PT End of Session - 02/19/22 1026   ? ? Visit Number 1   ? Number of Visits 12   ? Date for PT Re-Evaluation 04/02/22   ? Authorization Type [employer] Medicare (No VL/ no auth)   ? Progress Note Due on Visit 10   ? PT Start Time [phone]   ? PT Stop Time 1030   ? PT Time Calculation (min) 40 min   ? Activity Tolerance Patient tolerated treatment well   ? Behavior During Therapy [facility] for tasks assessed/performed   ? ?  ?  ? ?  ? ? ?Past Medical History:  ?Diagnosis Date  ? Asthma   ? ?Past Surgical History:  ?Procedure Laterality Date  ? DENTAL SURGERY    ? 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 Procedure: TOTAL KNEE ARTHROPLASTY;  Surgeon: Carole Civil, MD;  Location: [facility];  Service: Orthopedics;  Laterality: Left;  ? ?Patient Active Problem List  ? Diagnosis Date Noted  ? Osteoarthritis of left knee 02/02/2022  ? Unilateral primary osteoarthritis, left knee   ? ? ?PCP: Allyn Kenner MD ? ?REFERRING PROVIDER: Carole Civil, MD  ? ?REFERRING DIAG: M25.562,G89.29 (ICD-10-CM) - Chronic pain of left knee M17.10 (ICD-10-CM) - Primary localized osteoarthritis of knee  ? ?THERAPY DIAG:  ?Left knee pain, unspecified chronicity ? ?Stiffness of left knee, not elsewhere classified ? ?Other abnormalities of gait and mobility ? ?ONSET DATE: 02/02/22 ? ?SUBJECTIVE:  ? ?SUBJECTIVE STATEMENT: ?Patient presents s/p LT TKA. She is doing well. Stopped pain medication this week, has been well managed. She is walking using RW. Moderate sweeling beginning about a week ago but not too bad.  ? ?PERTINENT HISTORY: ?Lt TKA 02/02/22 ? ?PAIN:  ?Are you having pain? Yes: NPRS scale: 3/10 ?Pain location: Lt knee ?Pain description: tired, aching  ?Aggravating factors: Standing, WB ?Relieving factors: Moving, rest, ice ? ?PRECAUTIONS: None ? ?WEIGHT BEARING RESTRICTIONS No ? ?FALLS:   ?Has patient fallen in last 6 months? No ? ?LIVING ENVIRONMENT: ?Lives with: lives with their family and lives with their spouse ?Lives in: House/apartment ? ?OCCUPATION: Retired  ? ?PLOF: Independent ? ?PATIENT GOALS Get back to normal as fast as I can ? ? ?OBJECTIVE:  ? ?DIAGNOSTIC FINDINGS: NA ? ?PATIENT SURVEYS:  ?FOTO 66% function   ? ?COGNITION: ? Overall cognitive status: Within functional limits for tasks assessed   ?  ?SENSATION: ?WFL ? ? ?PALPATION: ?Mod TTP about LT adductors, quad and calf  ? ?LE ROM: ? ?Active ROM Right ?02/19/2022 Left ?02/19/2022  ?Hip flexion    ?Hip extension    ?Hip abduction    ?Hip adduction    ?Hip internal rotation    ?Hip external rotation    ?Knee flexion  93  ?Knee extension  -8  ?Ankle dorsiflexion    ?Ankle plantarflexion    ?Ankle inversion    ?Ankle eversion    ? (Blank rows = not tested) ? ?LE MMT: ? ?MMT Right ?02/19/2022 Left ?02/19/2022  ?Hip flexion 5 4  ?Hip extension    ?Hip abduction    ?Hip adduction    ?Hip internal rotation    ?Hip external rotation    ?Knee flexion 5 4+  ?Knee extension 5 4  ?Ankle dorsiflexion 5 4+  ?Ankle plantarflexion    ?Ankle inversion    ?Ankle eversion    ? (  Blank rows = not tested) ? ? ?GAIT: ?Decreased stride, RW, Mod I, decreased LT hip and knee flexion  ? ? ? ?TODAY'S TREATMENT: ?02/19/22 ?Quad set ?SLR ?Heel slide ?Ankle pump ?Heel prop  ?Heel raise ?Sit to stand  ? ? ?PATIENT EDUCATION:  ?Education details: on evaluation findings, POC and HEP  ?Person educated: Patient ?Education method: Explanation and Demonstration ?Education comprehension: verbalized understanding and returned demonstration ? ? ?HOME EXERCISE PROGRAM: ?Reviewed HEP from [facility] including : ?Quad set ?SLR ?Heel slide ?Ankle pump ?Heel prop  ?Heel raise ?Sit to stand  ? ?ASSESSMENT: ? ?CLINICAL IMPRESSION: ?Patient is a 71 y.o. female who presents to physical therapy with complaint of LT knee pain s/p LT TKA. Patient demonstrates muscle weakness, reduced ROM, and  fascial restrictions which are likely contributing to symptoms of pain and are negatively impacting patient ability to perform ADLs and functional mobility tasks. Patient will benefit from skilled physical therapy services to address these deficits to reduce pain and improve level of function with ADLs and functional mobility tasks. ? ? ? ?OBJECTIVE IMPAIRMENTS Abnormal gait, decreased activity tolerance, decreased balance, decreased endurance, decreased mobility, difficulty walking, decreased ROM, decreased strength, hypomobility, increased edema, increased fascial restrictions, impaired perceived functional ability, impaired flexibility, improper body mechanics, and pain.  ? ?ACTIVITY LIMITATIONS cleaning, community activity, driving, occupation, Medical sales representative, yard work, shopping, and yard work.  ? ?PERSONAL FACTORS  None  are also affecting patient's functional outcome.  ? ? ?REHAB POTENTIAL: Good ? ?CLINICAL DECISION MAKING: Stable/uncomplicated ? ?EVALUATION COMPLEXITY: Low ? ? ?GOALS: ?SHORT TERM GOALS: Target date: 03/12/2022 ? ?Patient will be independent with initial HEP and self-management strategies to improve functional outcomes ?Baseline:  ?Goal status: INITIAL  ? ?LONG TERM GOALS: Target date: 04/02/2022 ? ?Patient will be independent with advanced HEP and self-management strategies to improve functional outcomes ?Baseline:  ?Goal status: INITIAL ? ?2.  Patient will improve FOTO score to predicted value to indicate improvement in functional outcomes ?Baseline: 66% ?Goal status: INITIAL ? ?3.  Patient will have LT knee AROM 0-120 degrees to improve functional mobility and facilitate squatting to pick up items from floor. ?Baseline: -8 to 93 degrees ?Goal status: INITIAL ? ?4. Patient will have equal to or > 4+/5 MMT throughout LLE to improve ability to perform functional mobility, stair ambulation and ADLs.  ?Baseline: See MMT ?Goal status: INITIAL ? ? ? ?PLAN: ?PT FREQUENCY: 2x/week ? ?PT DURATION: 6  weeks ? ?PLANNED INTERVENTIONS: Therapeutic exercises, Therapeutic activity, Neuromuscular re-education, Balance training, Gait training, Patient/Family education, Joint manipulation, Joint mobilization, Stair training, Aquatic Therapy, Dry Needling, Electrical stimulation, Spinal manipulation, Spinal mobilization, Cryotherapy, Moist heat, scar mobilization, Taping, Traction, Ultrasound, Biofeedback, Ionotophoresis '4mg'$ /ml Dexamethasone, and Manual therapy.  ? ?PLAN FOR NEXT SESSION: Progress knee AROM and strengthening. Standing there ex, gait and balance. Manual as needed for pain and edema ? ?10:28 AM, 02/19/22 ?Josue Hector PT DPT  ?Physical Therapist with [place]  ?[facility]  ?[phone] ? ?

## 2022-02-22 ENCOUNTER — Encounter (HOSPITAL_COMMUNITY): Payer: Self-pay | Admitting: Physical Therapy

## 2022-02-22 ENCOUNTER — Ambulatory Visit (HOSPITAL_COMMUNITY): Payer: Medicare Other | Attending: Orthopedic Surgery | Admitting: Physical Therapy

## 2022-02-22 DIAGNOSIS — M25562 Pain in left knee: Secondary | ICD-10-CM

## 2022-02-22 DIAGNOSIS — M25662 Stiffness of left knee, not elsewhere classified: Secondary | ICD-10-CM | POA: Diagnosis not present

## 2022-02-22 DIAGNOSIS — R2689 Other abnormalities of gait and mobility: Secondary | ICD-10-CM | POA: Diagnosis not present

## 2022-02-22 NOTE — Therapy (Signed)
?OUTPATIENT PHYSICAL THERAPY TREATMENT NOTE ? ? ?Patient Name: Kelly Fox ?MRN: [id number] ?DOB:01/06/1951, 71 y.o., female ?Today's Date: 02/22/2022 ? ?PCP: Allyn Kenner MD  ?REFERRING PROVIDER: [name] MD ? ?END OF SESSION:  ? PT End of Session - 02/22/22 0951   ? ? Visit Number 2   ? Number of Visits 12   ? Date for PT Re-Evaluation 04/02/22   ? Authorization Type [employer] Medicare (No VL/ no auth)   ? Progress Note Due on Visit 10   ? PT Start Time [phone]   ? PT Stop Time 1028   ? PT Time Calculation (min) 40 min   ? Activity Tolerance Patient tolerated treatment well   ? Behavior During Therapy [facility] for tasks assessed/performed   ? ?  ?  ? ?  ? ? ?Past Medical History:  ?Diagnosis Date  ? Asthma   ? ?Past Surgical History:  ?Procedure Laterality Date  ? DENTAL SURGERY    ? 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 Procedure: TOTAL KNEE ARTHROPLASTY;  Surgeon: Carole Civil, MD;  Location: [facility];  Service: Orthopedics;  Laterality: Left;  ? ?Patient Active Problem List  ? Diagnosis Date Noted  ? Osteoarthritis of left knee 02/02/2022  ? Unilateral primary osteoarthritis, left knee   ? ? ?REFERRING DIAG: M25.562,G89.29 (ICD-10-CM) - Chronic pain of left knee M17.10 (ICD-10-CM) - Primary localized osteoarthritis of knee  ? ?THERAPY DIAG:  ?Left knee pain, unspecified chronicity ? ?Stiffness of left knee, not elsewhere classified ? ?Other abnormalities of gait and mobility ? ?PERTINENT HISTORY: Lt TKA 02/02/22 ? ?PRECAUTIONS: None ? ?SUBJECTIVE: Doing well. No issues. Compliant with HEP.  ? ?PAIN:  ?Are you having pain? Yes: NPRS scale: 2/10 ?Pain location: Lt knee ?Pain description: tired, aching  ?Aggravating factors: Standing, WB ?Relieving factors: Moving, rest, ice ? ?OBJECTIVE:  ?  ?DIAGNOSTIC FINDINGS: NA ?  ?PATIENT SURVEYS:  ?FOTO 66% function   ?  ?COGNITION: ?          Overall cognitive status: Within functional limits for tasks assessed               ?           ?SENSATION: ?WFL ?  ?   ?PALPATION: ?Mod TTP about LT adductors, quad and calf  ?  ?LE ROM: ?  ?Active ROM Right ?02/19/2022 Left ?02/19/2022  ?Hip flexion      ?Hip extension      ?Hip abduction      ?Hip adduction      ?Hip internal rotation      ?Hip external rotation      ?Knee flexion   93  ?Knee extension   -8  ?Ankle dorsiflexion      ?Ankle plantarflexion      ?Ankle inversion      ?Ankle eversion      ? (Blank rows = not tested) ?  ?LE MMT: ?  ?MMT Right ?02/19/2022 Left ?02/19/2022  ?Hip flexion 5 4  ?Hip extension      ?Hip abduction      ?Hip adduction      ?Hip internal rotation      ?Hip external rotation      ?Knee flexion 5 4+  ?Knee extension 5 4  ?Ankle dorsiflexion 5 4+  ?Ankle plantarflexion      ?Ankle inversion      ?Ankle eversion      ? (Blank rows = not tested) ?  ?  ?GAIT: ?  Decreased stride, RW, Mod I, decreased LT hip and knee flexion  ?  ?  ?  ?TODAY'S TREATMENT: ?02/22/22 ?Quad set 10 x 5" ?SLR 2 x 10  ?Heel slide 10 x 5" ?Bridge 2 x 10 ?Knee extension (supine) with manual OP 10 x 5"  ?Heel raise 2 x10 ?Standing hip abduction 2 x 10  ?Mini squat 2 x 10  ?Tandem stance 3 x 20"  ?Gait with SPC 1 RT in clinic (cues for sequence) ?Rec bike seat 10 rocking 4 min  ? ?(LT knee AROM -7 to 97 degrees)  ? ?02/19/22 ?Quad set ?SLR ?Heel slide ?Ankle pump ?Heel prop  ?Heel raise ?Sit to stand  ?  ?  ?PATIENT EDUCATION:  ?Education details: on exercise form and function  ?Person educated: Patient ?Education method: Explanation and Demonstration ?Education comprehension: verbalized understanding and returned demonstration ?  ?  ?HOME EXERCISE PROGRAM: ?Reviewed HEP from [facility] including : ?Quad set ?SLR ?Heel slide ?Ankle pump ?Heel prop  ?Heel raise ?Sit to stand  ?  ?ASSESSMENT: ?  ?CLINICAL IMPRESSION: ?Patient tolerated session well today. Progressed LE strengthening exercise. Patient continues to demo AROM restriction, but is steadily improving. Patient showing very good static balance with tandem stance. Progressed to gait  training using SPC. Patient did very well with this, but did require initial verbal cues and demo for proper sequencing. Good stability, no LOB. Patient will continue to benefit from skilled therapy services to reduce remaining deficits and improve functional ability.  ? ?  ?OBJECTIVE IMPAIRMENTS Abnormal gait, decreased activity tolerance, decreased balance, decreased endurance, decreased mobility, difficulty walking, decreased ROM, decreased strength, hypomobility, increased edema, increased fascial restrictions, impaired perceived functional ability, impaired flexibility, improper body mechanics, and pain.  ?  ?ACTIVITY LIMITATIONS cleaning, community activity, driving, occupation, Medical sales representative, yard work, shopping, and yard work.  ?  ?PERSONAL FACTORS  None  are also affecting patient's functional outcome.  ?  ?  ?REHAB POTENTIAL: Good ?  ?CLINICAL DECISION MAKING: Stable/uncomplicated ?  ?EVALUATION COMPLEXITY: Low ?  ?  ?GOALS: ?SHORT TERM GOALS: Target date: 03/12/2022 ?  ?Patient will be independent with initial HEP and self-management strategies to improve functional outcomes ?Baseline:  ?Goal status: INITIAL  ?  ?LONG TERM GOALS: Target date: 04/02/2022 ?  ?Patient will be independent with advanced HEP and self-management strategies to improve functional outcomes ?Baseline:  ?Goal status: INITIAL ?  ?2.  Patient will improve FOTO score to predicted value to indicate improvement in functional outcomes ?Baseline: 66% ?Goal status: INITIAL ?  ?3.  Patient will have LT knee AROM 0-120 degrees to improve functional mobility and facilitate squatting to pick up items from floor. ?Baseline: -8 to 93 degrees ?Goal status: INITIAL ?  ?4. Patient will have equal to or > 4+/5 MMT throughout LLE to improve ability to perform functional mobility, stair ambulation and ADLs.  ?Baseline: See MMT ?Goal status: INITIAL ?  ?  ?  ?PLAN: ?PT FREQUENCY: 2x/week ?  ?PT DURATION: 6 weeks ?  ?PLANNED INTERVENTIONS: Therapeutic exercises,  Therapeutic activity, Neuromuscular re-education, Balance training, Gait training, Patient/Family education, Joint manipulation, Joint mobilization, Stair training, Aquatic Therapy, Dry Needling, Electrical stimulation, Spinal manipulation, Spinal mobilization, Cryotherapy, Moist heat, scar mobilization, Taping, Traction, Ultrasound, Biofeedback, Ionotophoresis '4mg'$ /ml Dexamethasone, and Manual therapy.  ?  ?PLAN FOR NEXT SESSION: Progress knee AROM and strengthening. Standing there ex, gait and balance. Manual as needed for pain and edema ? ? ? ?10:29 AM, 02/22/22 ?Josue Hector PT DPT  ?Physical Therapist with  [place]  ?[facility]  ?[phone] ? ?   ?

## 2022-02-25 ENCOUNTER — Encounter (HOSPITAL_COMMUNITY): Payer: Self-pay | Admitting: Physical Therapy

## 2022-02-25 ENCOUNTER — Other Ambulatory Visit: Payer: Self-pay | Admitting: Orthopedic Surgery

## 2022-02-25 ENCOUNTER — Ambulatory Visit (HOSPITAL_COMMUNITY): Payer: Medicare Other | Admitting: Physical Therapy

## 2022-02-25 DIAGNOSIS — M25662 Stiffness of left knee, not elsewhere classified: Secondary | ICD-10-CM | POA: Diagnosis not present

## 2022-02-25 DIAGNOSIS — M25562 Pain in left knee: Secondary | ICD-10-CM | POA: Diagnosis not present

## 2022-02-25 DIAGNOSIS — R2689 Other abnormalities of gait and mobility: Secondary | ICD-10-CM

## 2022-02-25 NOTE — Therapy (Signed)
?OUTPATIENT PHYSICAL THERAPY TREATMENT NOTE ? ? ?Patient Name: Kelly Fox ?MRN: [id number] ?DOB:12/03/1950, 71 y.o., female ?Today's Date: 02/25/2022 ? ?PCP: Allyn Kenner MD  ?REFERRING PROVIDER: [name] MD ? ?END OF SESSION:  ? PT End of Session - 02/25/22 0950   ? ? Visit Number 3   ? Number of Visits 12   ? Date for PT Re-Evaluation 04/02/22   ? Authorization Type [employer] Medicare (No VL/ no auth)   ? Progress Note Due on Visit 10   ? PT Start Time [phone]   ? PT Stop Time 1030   ? PT Time Calculation (min) 44 min   ? Activity Tolerance Patient tolerated treatment well   ? Behavior During Therapy [facility] for tasks assessed/performed   ? ?  ?  ? ?  ? ? ?Past Medical History:  ?Diagnosis Date  ? Asthma   ? ?Past Surgical History:  ?Procedure Laterality Date  ? DENTAL SURGERY    ? 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 Procedure: TOTAL KNEE ARTHROPLASTY;  Surgeon: Carole Civil, MD;  Location: [facility];  Service: Orthopedics;  Laterality: Left;  ? ?Patient Active Problem List  ? Diagnosis Date Noted  ? Osteoarthritis of left knee 02/02/2022  ? Unilateral primary osteoarthritis, left knee   ? ? ?REFERRING DIAG: M25.562,G89.29 (ICD-10-CM) - Chronic pain of left knee M17.10 (ICD-10-CM) - Primary localized osteoarthritis of knee  ? ?THERAPY DIAG:  ?Left knee pain, unspecified chronicity ? ?Stiffness of left knee, not elsewhere classified ? ?Other abnormalities of gait and mobility ? ?PERTINENT HISTORY: Lt TKA 02/02/22 ? ?PRECAUTIONS: None ? ?SUBJECTIVE: Walking more with cane, doing well. Still sore at night.  ? ?PAIN:  ?Are you having pain? Yes: NPRS scale: 2/10 ?Pain location: Lt knee ?Pain description: tired, aching  ?Aggravating factors: Standing, WB ?Relieving factors: Moving, rest, ice ? ?OBJECTIVE:  ?  ?DIAGNOSTIC FINDINGS: NA ?  ?PATIENT SURVEYS:  ?FOTO 66% function   ?  ?COGNITION: ?          Overall cognitive status: Within functional limits for tasks assessed               ?           ?SENSATION: ?WFL ?  ?   ?PALPATION: ?Mod TTP about LT adductors, quad and calf  ?  ?LE ROM: ?  ?Active ROM Right ?02/19/2022 Left ?02/19/2022  ?Hip flexion      ?Hip extension      ?Hip abduction      ?Hip adduction      ?Hip internal rotation      ?Hip external rotation      ?Knee flexion   93  ?Knee extension   -8  ?Ankle dorsiflexion      ?Ankle plantarflexion      ?Ankle inversion      ?Ankle eversion      ? (Blank rows = not tested) ?  ?LE MMT: ?  ?MMT Right ?02/19/2022 Left ?02/19/2022  ?Hip flexion 5 4  ?Hip extension      ?Hip abduction      ?Hip adduction      ?Hip internal rotation      ?Hip external rotation      ?Knee flexion 5 4+  ?Knee extension 5 4  ?Ankle dorsiflexion 5 4+  ?Ankle plantarflexion      ?Ankle inversion      ?Ankle eversion      ? (Blank rows = not tested) ?  ?  ?  GAIT: ?Decreased stride, RW, Mod I, decreased LT hip and knee flexion  ?  ?  ?  ?TODAY'S TREATMENT: ?02/25/22 ?Bike seat 9 (full revolution) 5 min  ?Calf stretch 3 x 30" slant board  ?Heel raise 2 x 10 ?Knee driver 6 inch 10 x 5" ?Step ups 4 inch 2 x 10 ?Tandem stance 3 x 30"  ?Standing hip abduction 2 x 10 ?Sit to stands x10 ?Gait 1 RT in clinic with cane  ? ?Manual knee PROM knee flexion and extension (LT knee AROM: -5 to 108 degrees)  ? ?02/22/22 ?Quad set 10 x 5" ?SLR 2 x 10  ?Heel slide 10 x 5" ?Bridge 2 x 10 ?Knee extension (supine) with manual OP 10 x 5"  ?Heel raise 2 x10 ?Standing hip abduction 2 x 10  ?Mini squat 2 x 10  ?Tandem stance 3 x 20"  ?Gait with SPC 1 RT in clinic (cues for sequence) ?Rec bike seat 10 rocking 4 min  ? ?(LT knee AROM -7 to 97 degrees)  ? ?02/19/22 ?Quad set ?SLR ?Heel slide ?Ankle pump ?Heel prop  ?Heel raise ?Sit to stand  ?  ?  ?PATIENT EDUCATION:  ?Education details: on exercise form and function  ?Person educated: Patient ?Education method: Explanation and Demonstration ?Education comprehension: verbalized understanding and returned demonstration ?  ?  ?HOME EXERCISE PROGRAM: ?Reviewed HEP from [facility] including : ?Quad  set ?SLR ?Heel slide ?Ankle pump ?Heel prop  ?Heel raise ?Sit to stand  ?  ?ASSESSMENT: ?  ?CLINICAL IMPRESSION: ?Patient showing good progress. Progressed LE strength with added step ups. AROM slowly progressing. Continued work on Personnel officer using [place]. [place] showing improved static balance. Patient will continue to benefit from skilled therapy services to reduce remaining deficits and improve functional ability.  ? ?  ?OBJECTIVE IMPAIRMENTS Abnormal gait, decreased activity tolerance, decreased balance, decreased endurance, decreased mobility, difficulty walking, decreased ROM, decreased strength, hypomobility, increased edema, increased fascial restrictions, impaired perceived functional ability, impaired flexibility, improper body mechanics, and pain.  ?  ?ACTIVITY LIMITATIONS cleaning, community activity, driving, occupation, Medical sales representative, yard work, shopping, and yard work.  ?  ?PERSONAL FACTORS  None  are also affecting patient's functional outcome.  ?  ?  ?REHAB POTENTIAL: Good ?  ?CLINICAL DECISION MAKING: Stable/uncomplicated ?  ?EVALUATION COMPLEXITY: Low ?  ?  ?GOALS: ?SHORT TERM GOALS: Target date: 03/12/2022 ?  ?Patient will be independent with initial HEP and self-management strategies to improve functional outcomes ?Baseline:  ?Goal status: INITIAL  ?  ?LONG TERM GOALS: Target date: 04/02/2022 ?  ?Patient will be independent with advanced HEP and self-management strategies to improve functional outcomes ?Baseline:  ?Goal status: INITIAL ?  ?2.  Patient will improve FOTO score to predicted value to indicate improvement in functional outcomes ?Baseline: 66% ?Goal status: INITIAL ?  ?3.  Patient will have LT knee AROM 0-120 degrees to improve functional mobility and facilitate squatting to pick up items from floor. ?Baseline: -8 to 93 degrees ?Goal status: INITIAL ?  ?4. Patient will have equal to or > 4+/5 MMT throughout LLE to improve ability to perform functional mobility, stair ambulation and ADLs.   ?Baseline: See MMT ?Goal status: INITIAL ?  ?  ?  ?PLAN: ?PT FREQUENCY: 2x/week ?  ?PT DURATION: 6 weeks ?  ?PLANNED INTERVENTIONS: Therapeutic exercises, Therapeutic activity, Neuromuscular re-education, Balance training, Gait training, Patient/Family education, Joint manipulation, Joint mobilization, Stair training, Aquatic Therapy, Dry Needling, Electrical stimulation, Spinal manipulation, Spinal mobilization, Cryotherapy, Moist heat, scar mobilization,  Taping, Traction, Ultrasound, Biofeedback, Ionotophoresis '4mg'$ /ml Dexamethasone, and Manual therapy.  ?  ?PLAN FOR NEXT SESSION: Progress knee AROM and strengthening. Standing there ex, gait and balance. Manual as needed for pain and edema ? ? ? ?10:31 AM, 02/25/22 ?Josue Hector PT DPT  ?Physical Therapist with [place]  ?[facility]  ?[phone] ? ?   ?

## 2022-03-01 ENCOUNTER — Encounter (HOSPITAL_COMMUNITY): Payer: Self-pay | Admitting: Physical Therapy

## 2022-03-01 ENCOUNTER — Ambulatory Visit (HOSPITAL_COMMUNITY): Payer: Medicare Other | Admitting: Physical Therapy

## 2022-03-01 DIAGNOSIS — R2689 Other abnormalities of gait and mobility: Secondary | ICD-10-CM | POA: Diagnosis not present

## 2022-03-01 DIAGNOSIS — M25662 Stiffness of left knee, not elsewhere classified: Secondary | ICD-10-CM

## 2022-03-01 DIAGNOSIS — M25562 Pain in left knee: Secondary | ICD-10-CM | POA: Diagnosis not present

## 2022-03-01 NOTE — Therapy (Signed)
?OUTPATIENT PHYSICAL THERAPY TREATMENT NOTE ? ? ?Patient Name: Kelly Fox ?MRN: [id number] ?DOB:1951/05/05, 71 y.o., female ?Today's Date: 03/01/2022 ? ?PCP: Allyn Kenner MD  ?REFERRING PROVIDER: [name] MD ? ?END OF SESSION:  ? PT End of Session - 03/01/22 0959   ? ? Visit Number 4   ? Number of Visits 12   ? Date for PT Re-Evaluation 04/02/22   ? Authorization Type [employer] Medicare (No VL/ no auth)   ? Progress Note Due on Visit 10   ? PT Start Time [phone]   late arrival  ? PT Stop Time 1028   ? PT Time Calculation (min) 32 min   ? Activity Tolerance Patient tolerated treatment well   ? Behavior During Therapy [facility] for tasks assessed/performed   ? ?  ?  ? ?  ? ? ?Past Medical History:  ?Diagnosis Date  ? Asthma   ? ?Past Surgical History:  ?Procedure Laterality Date  ? DENTAL SURGERY    ? 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 Procedure: TOTAL KNEE ARTHROPLASTY;  Surgeon: Carole Civil, MD;  Location: [facility];  Service: Orthopedics;  Laterality: Left;  ? ?Patient Active Problem List  ? Diagnosis Date Noted  ? Osteoarthritis of left knee 02/02/2022  ? Unilateral primary osteoarthritis, left knee   ? ? ?REFERRING DIAG: M25.562,G89.29 (ICD-10-CM) - Chronic pain of left knee M17.10 (ICD-10-CM) - Primary localized osteoarthritis of knee  ? ?THERAPY DIAG:  ?Left knee pain, unspecified chronicity ? ?Stiffness of left knee, not elsewhere classified ? ?Other abnormalities of gait and mobility ? ?PERTINENT HISTORY: Lt TKA 02/02/22 ? ?PRECAUTIONS: None ? ?SUBJECTIVE: Doing good. Took bandages off. Pain is low.  ? ?PAIN:  ?Are you having pain? Yes: NPRS scale: 1/10 ?Pain location: Lt knee ?Pain description: tired, aching  ?Aggravating factors: Standing, WB ?Relieving factors: Moving, rest, ice ? ?OBJECTIVE:  ?  ?DIAGNOSTIC FINDINGS: NA ?  ?PATIENT SURVEYS:  ?FOTO 66% function   ?  ?COGNITION: ?          Overall cognitive status: Within functional limits for tasks assessed               ?           ?SENSATION: ?WFL ?  ?   ?PALPATION: ?Mod TTP about LT adductors, quad and calf  ?  ?LE ROM: ?  ?Active ROM Right ?02/19/2022 Left ?02/19/2022  ?Hip flexion      ?Hip extension      ?Hip abduction      ?Hip adduction      ?Hip internal rotation      ?Hip external rotation      ?Knee flexion   93  ?Knee extension   -8  ?Ankle dorsiflexion      ?Ankle plantarflexion      ?Ankle inversion      ?Ankle eversion      ? (Blank rows = not tested) ?  ?LE MMT: ?  ?MMT Right ?02/19/2022 Left ?02/19/2022  ?Hip flexion 5 4  ?Hip extension      ?Hip abduction      ?Hip adduction      ?Hip internal rotation      ?Hip external rotation      ?Knee flexion 5 4+  ?Knee extension 5 4  ?Ankle dorsiflexion 5 4+  ?Ankle plantarflexion      ?Ankle inversion      ?Ankle eversion      ? (Blank rows = not tested) ?  ?  ?  GAIT: ?Decreased stride, RW, Mod I, decreased LT hip and knee flexion  ?  ?  ?  ?TODAY'S TREATMENT: ?03/01/22 ?Bike seat 9 (full revolution) 4 min  ?Calf stretch 3 x 30" slant board  ?Heel raise 2 x 10 ?Knee driver 8 inch 10 x 5" ?Step ups 6 inch 2 x 10 HHA x1  ?Step down 4 inch 2 x 10 HHA x 1  ?Tandem stance 2 x 30" on foam   ? ?  (LT knee AROM: -5 to 107 degrees)  ? ?02/25/22 ?Bike seat 9 (full revolution) 5 min  ?Calf stretch 3 x 30" slant board  ?Heel raise 2 x 10 ?Knee driver 6 inch 10 x 5" ?Step ups 4 inch 2 x 10 ?Tandem stance 3 x 30"  ?Standing hip abduction 2 x 10 ?Sit to stands x10 ?Gait 1 RT in clinic with cane  ? ?Manual knee PROM knee flexion and extension (LT knee AROM: -5 to 108 degrees)  ? ?02/22/22 ?Quad set 10 x 5" ?SLR 2 x 10  ?Heel slide 10 x 5" ?Bridge 2 x 10 ?Knee extension (supine) with manual OP 10 x 5"  ?Heel raise 2 x10 ?Standing hip abduction 2 x 10  ?Mini squat 2 x 10  ?Tandem stance 3 x 20"  ?Gait with SPC 1 RT in clinic (cues for sequence) ?Rec bike seat 10 rocking 4 min  ? ?(LT knee AROM -7 to 97 degrees)  ? ?02/19/22 ?Quad set ?SLR ?Heel slide ?Ankle pump ?Heel prop  ?Heel raise ?Sit to stand  ?  ?  ?PATIENT EDUCATION:   ?Education details: on exercise form and function  ?Person educated: Patient ?Education method: Explanation and Demonstration ?Education comprehension: verbalized understanding and returned demonstration ?  ?  ?HOME EXERCISE PROGRAM: ?Reviewed HEP from [facility] including : ?Quad set ?SLR ?Heel slide ?Ankle pump ?Heel prop  ?Heel raise ?Sit to stand  ?  ?ASSESSMENT: ?  ?CLINICAL IMPRESSION: ?Patient doing very well. Progressing well with functional activity . Able to increased forward step height to 6 inch. Also added 45 inch step downs with cues for sequence and HHA. Patient tolerated well. Continues to demo slight limitation in LT knee AROM. Patient will continue to benefit from skilled therapy services to reduce remaining deficits and improve functional ability.  ? ?  ?OBJECTIVE IMPAIRMENTS Abnormal gait, decreased activity tolerance, decreased balance, decreased endurance, decreased mobility, difficulty walking, decreased ROM, decreased strength, hypomobility, increased edema, increased fascial restrictions, impaired perceived functional ability, impaired flexibility, improper body mechanics, and pain.  ?  ?ACTIVITY LIMITATIONS cleaning, community activity, driving, occupation, Medical sales representative, yard work, shopping, and yard work.  ?  ?PERSONAL FACTORS  None  are also affecting patient's functional outcome.  ?  ?  ?REHAB POTENTIAL: Good ?  ?CLINICAL DECISION MAKING: Stable/uncomplicated ?  ?EVALUATION COMPLEXITY: Low ?  ?  ?GOALS: ?SHORT TERM GOALS: Target date: 03/12/2022 ?  ?Patient will be independent with initial HEP and self-management strategies to improve functional outcomes ?Baseline:  ?Goal status: INITIAL  ?  ?LONG TERM GOALS: Target date: 04/02/2022 ?  ?Patient will be independent with advanced HEP and self-management strategies to improve functional outcomes ?Baseline:  ?Goal status: INITIAL ?  ?2.  Patient will improve FOTO score to predicted value to indicate improvement in functional outcomes ?Baseline: 66% ?Goal  status: INITIAL ?  ?3.  Patient will have LT knee AROM 0-120 degrees to improve functional mobility and facilitate squatting to pick up items from floor. ?Baseline: -8 to 93 degrees ?  Goal status: INITIAL ?  ?4. Patient will have equal to or > 4+/5 MMT throughout LLE to improve ability to perform functional mobility, stair ambulation and ADLs.  ?Baseline: See MMT ?Goal status: INITIAL ?  ?  ?  ?PLAN: ?PT FREQUENCY: 2x/week ?  ?PT DURATION: 6 weeks ?  ?PLANNED INTERVENTIONS: Therapeutic exercises, Therapeutic activity, Neuromuscular re-education, Balance training, Gait training, Patient/Family education, Joint manipulation, Joint mobilization, Stair training, Aquatic Therapy, Dry Needling, Electrical stimulation, Spinal manipulation, Spinal mobilization, Cryotherapy, Moist heat, scar mobilization, Taping, Traction, Ultrasound, Biofeedback, Ionotophoresis '4mg'$ /ml Dexamethasone, and Manual therapy.  ?  ?PLAN FOR NEXT SESSION: Add TKE. Progress knee AROM and strengthening. Standing there ex, gait and balance. Manual as needed for pain and edema ? ? ? ?10:21 AM, 03/01/22 ?Josue Hector PT DPT  ?Physical Therapist with [place]  ?[facility]  ?[phone] ? ?   ?

## 2022-03-04 ENCOUNTER — Encounter (HOSPITAL_COMMUNITY): Payer: Medicare Other | Admitting: Physical Therapy

## 2022-03-08 ENCOUNTER — Ambulatory Visit (HOSPITAL_COMMUNITY): Payer: Medicare Other | Admitting: Physical Therapy

## 2022-03-08 DIAGNOSIS — M25662 Stiffness of left knee, not elsewhere classified: Secondary | ICD-10-CM

## 2022-03-08 DIAGNOSIS — M25562 Pain in left knee: Secondary | ICD-10-CM

## 2022-03-08 DIAGNOSIS — R2689 Other abnormalities of gait and mobility: Secondary | ICD-10-CM

## 2022-03-08 NOTE — Therapy (Signed)
?OUTPATIENT PHYSICAL THERAPY TREATMENT NOTE ? ? ?Patient Name: Kelly Fox ?MRN: [id number] ?DOB:Aug 11, 1951, 71 y.o., female ?Today's Date: 03/08/2022 ? ?PCP: Allyn Kenner MD  ?REFERRING PROVIDER: [name] MD ? ?END OF SESSION:  ? PT End of Session - 03/08/22 1148   ? ? Visit Number 5   ? Number of Visits 12   ? Date for PT Re-Evaluation 04/02/22   ? Authorization Type [employer] Medicare (No VL/ no auth)   ? Progress Note Due on Visit 10   ? PT Start Time 1134   ? PT Stop Time 1218   ? PT Time Calculation (min) 44 min   ? Activity Tolerance Patient tolerated treatment well   ? Behavior During Therapy [facility] for tasks assessed/performed   ? ?  ?  ? ?  ? ? ?Past Medical History:  ?Diagnosis Date  ? Asthma   ? ?Past Surgical History:  ?Procedure Laterality Date  ? DENTAL SURGERY    ? 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 Procedure: TOTAL KNEE ARTHROPLASTY;  Surgeon: Carole Civil, MD;  Location: [facility];  Service: Orthopedics;  Laterality: Left;  ? ?Patient Active Problem List  ? Diagnosis Date Noted  ? Osteoarthritis of left knee 02/02/2022  ? Unilateral primary osteoarthritis, left knee   ? ? ?REFERRING DIAG: M25.562,G89.29 (ICD-10-CM) - Chronic pain of left knee M17.10 (ICD-10-CM) - Primary localized osteoarthritis of knee  ? ?THERAPY DIAG:  ?Left knee pain, unspecified chronicity ? ?Stiffness of left knee, not elsewhere classified ? ?Other abnormalities of gait and mobility ? ?PERTINENT HISTORY: Lt TKA 02/02/22 ? ?PRECAUTIONS: None ? ?SUBJECTIVE: Doing good, really no pain just some stiffness ?PAIN:  ?Are you having pain? Yes: NPRS scale: 0/10 ?Pain location: Lt knee ?Pain description: tired, aching  ?Aggravating factors: Standing, WB ?Relieving factors: Moving, rest, ice ? ?OBJECTIVE:  ?  ?DIAGNOSTIC FINDINGS: NA ?  ?PATIENT SURVEYS:  ?FOTO 66% function   ?  ?COGNITION: ?          Overall cognitive status: Within functional limits for tasks assessed               ?           ?SENSATION: ?WFL ?  ?   ?PALPATION: ?Mod TTP about LT adductors, quad and calf  ?  ?LE ROM: ?  ?Active ROM Left ?02/19/22 Left ?03/08/22  ?Knee flexion 93 110  ?Knee extension -8 -6  ? ?  ?LE MMT: ?  ?MMT Right ?02/19/2022 Left ?02/19/2022  ?Hip flexion 5 4  ?Hip extension      ?Hip abduction      ?Hip adduction      ?Hip internal rotation      ?Hip external rotation      ?Knee flexion 5 4+  ?Knee extension 5 4  ?Ankle dorsiflexion 5 4+  ?Ankle plantarflexion      ?Ankle inversion      ?Ankle eversion      ? (Blank rows = not tested) ?  ?  ?GAIT: ?Decreased stride, RW, Mod I, decreased LT hip and knee flexion  ?  ?  ?  ?TODAY'S TREATMENT: ?03/08/22 ?Bike seat 8 (full revolution) 4 min  ?Standing: Calf stretch 3 x 30" slant board  ?Heel raise 20X ?Toe raise 20X ?Knee drive 12 inch 10 x 5" ?Step ups 6 inch 20 HHA x1  ?Step down 4 inch 20 HHA x 1  ?Tandem stance 3 x 30" on foam   ?TKE with bodycraft  3PL 2X10 Lt LE ?AROM: -6 to 110 degrees ?  ?03/01/22 ?Bike seat 9 (full revolution) 4 min  ?Calf stretch 3 x 30" slant board  ?Heel raise 2 x 10 ?Knee driver 8 inch 10 x 5" ?Step ups 6 inch 2 x 10 HHA x1  ?Step down 4 inch 2 x 10 HHA x 1  ?Tandem stance 2 x 30" on foam   ? ?  (LT knee AROM: -5 to 107 degrees)  ? ?02/25/22 ?Bike seat 9 (full revolution) 5 min  ?Calf stretch 3 x 30" slant board  ?Heel raise 2 x 10 ?Knee driver 6 inch 10 x 5" ?Step ups 4 inch 2 x 10 ?Tandem stance 3 x 30"  ?Standing hip abduction 2 x 10 ?Sit to stands x10 ?Gait 1 RT in clinic with cane  ? ?Manual knee PROM knee flexion and extension (LT knee AROM: -5 to 108 degrees)  ? ?02/22/22 ?Quad set 10 x 5" ?SLR 2 x 10  ?Heel slide 10 x 5" ?Bridge 2 x 10 ?Knee extension (supine) with manual OP 10 x 5"  ?Heel raise 2 x10 ?Standing hip abduction 2 x 10  ?Mini squat 2 x 10  ?Tandem stance 3 x 20"  ?Gait with SPC 1 RT in clinic (cues for sequence) ?Rec bike seat 10 rocking 4 min  ? ?(LT knee AROM -7 to 97 degrees)  ? ?02/19/22 ?Quad set ?SLR ?Heel slide ?Ankle pump ?Heel prop  ?Heel  raise ?Sit to stand  ?  ?  ?PATIENT EDUCATION:  ?Education details: on exercise form and function  ?Person educated: Patient ?Education method: Explanation and Demonstration ?Education comprehension: verbalized understanding and returned demonstration ?  ?  ?HOME EXERCISE PROGRAM: ?Reviewed HEP from [facility] including : ?Quad set ?SLR ?Heel slide ?Ankle pump ?Heel prop  ?Heel raise ?Sit to stand  ?  ?ASSESSMENT: ?  ?CLINICAL IMPRESSION: ?Continued focus on Lt knee AROM, strength and functional activity.  Began with bike for warmup and able to move seat closer.  Pt  requires cues to attend to task and increase hold times with therex.  Added TKE using the body craft machine with good form.  Patient tolerated treatment well today. Pt will continue to benefit skilled therapy services to reduce remaining deficits and improve functional ability.  ? ?  ?OBJECTIVE IMPAIRMENTS Abnormal gait, decreased activity tolerance, decreased balance, decreased endurance, decreased mobility, difficulty walking, decreased ROM, decreased strength, hypomobility, increased edema, increased fascial restrictions, impaired perceived functional ability, impaired flexibility, improper body mechanics, and pain.  ?  ?ACTIVITY LIMITATIONS cleaning, community activity, driving, occupation, Medical sales representative, yard work, shopping, and yard work.  ?  ?PERSONAL FACTORS  None  are also affecting patient's functional outcome.  ?  ?  ?REHAB POTENTIAL: Good ?  ?CLINICAL DECISION MAKING: Stable/uncomplicated ?  ?EVALUATION COMPLEXITY: Low ?  ?  ?GOALS: ?SHORT TERM GOALS: Target date: 03/12/2022 ?  ?Patient will be independent with initial HEP and self-management strategies to improve functional outcomes ?Baseline:  ?Goal status: INITIAL  ?  ?LONG TERM GOALS: Target date: 04/02/2022 ?  ?Patient will be independent with advanced HEP and self-management strategies to improve functional outcomes ?Baseline:  ?Goal status: INITIAL ?  ?2.  Patient will improve FOTO score to predicted  value to indicate improvement in functional outcomes ?Baseline: 66% ?Goal status: INITIAL ?  ?3.  Patient will have LT knee AROM 0-120 degrees to improve functional mobility and facilitate squatting to pick up items from floor. ?Baseline: -8 to 93 degrees ?  Goal status: INITIAL ?  ?4. Patient will have equal to or > 4+/5 MMT throughout LLE to improve ability to perform functional mobility, stair ambulation and ADLs.  ?Baseline: See MMT ?Goal status: INITIAL ?  ?  ?  ?PLAN: ?PT FREQUENCY: 2x/week ?  ?PT DURATION: 6 weeks ?  ?PLANNED INTERVENTIONS: Therapeutic exercises, Therapeutic activity, Neuromuscular re-education, Balance training, Gait training, Patient/Family education, Joint manipulation, Joint mobilization, Stair training, Aquatic Therapy, Dry Needling, Electrical stimulation, Spinal manipulation, Spinal mobilization, Cryotherapy, Moist heat, scar mobilization, Taping, Traction, Ultrasound, Biofeedback, Ionotophoresis '4mg'$ /ml Dexamethasone, and Manual therapy.  ?  ?PLAN FOR NEXT SESSION: Progress knee AROM and strengthening. Standing there ex, gait and balance. Manual as needed for pain and edema ? ? ? ?12:17 PM, 03/08/22 ?Sally Reimers Sula Soda, PTA/CLT, WTA ?[phone] ?   ?

## 2022-03-11 ENCOUNTER — Encounter: Payer: Self-pay | Admitting: Orthopedic Surgery

## 2022-03-11 ENCOUNTER — Encounter (HOSPITAL_COMMUNITY): Payer: Self-pay | Admitting: Physical Therapy

## 2022-03-11 ENCOUNTER — Ambulatory Visit (HOSPITAL_COMMUNITY): Payer: Medicare Other | Admitting: Physical Therapy

## 2022-03-11 ENCOUNTER — Ambulatory Visit (INDEPENDENT_AMBULATORY_CARE_PROVIDER_SITE_OTHER): Payer: Medicare Other | Admitting: Orthopedic Surgery

## 2022-03-11 VITALS — Ht 61.0 in | Wt 187.0 lb

## 2022-03-11 DIAGNOSIS — R2689 Other abnormalities of gait and mobility: Secondary | ICD-10-CM | POA: Diagnosis not present

## 2022-03-11 DIAGNOSIS — M25562 Pain in left knee: Secondary | ICD-10-CM | POA: Diagnosis not present

## 2022-03-11 DIAGNOSIS — M25662 Stiffness of left knee, not elsewhere classified: Secondary | ICD-10-CM | POA: Diagnosis not present

## 2022-03-11 DIAGNOSIS — M1710 Unilateral primary osteoarthritis, unspecified knee: Secondary | ICD-10-CM

## 2022-03-11 DIAGNOSIS — Z96652 Presence of left artificial knee joint: Secondary | ICD-10-CM

## 2022-03-11 NOTE — Patient Instructions (Signed)
Ice knee every night for 30 mins

## 2022-03-11 NOTE — Therapy (Signed)
OUTPATIENT PHYSICAL THERAPY TREATMENT NOTE   Patient Name: Kelly Fox MRN: [id number] DOB:04/06/1951, 71 y.o., female Today's Date: 03/11/2022 Progress Note Reporting Period 02/19/22 to 04/02/22  See note below for Objective Data and Assessment of Progress/Goals.     PCP: Allyn Kenner MD  REFERRING PROVIDER: [name] MD  END OF SESSION:   PT End of Session - 03/11/22 0946     Visit Number 6    Number of Visits 12    Date for PT Re-Evaluation 04/02/22    Authorization Type [employer]HC Medicare (No VL/ no auth)    Progress Note Due on Visit 10    PT Start Time 0958    PT Stop Time 1040    PT Time Calculation (min) 42 min    Activity Tolerance Patient tolerated treatment well    Behavior During Therapy [facility] for tasks assessed/performed             Past Medical History:  Diagnosis Date   Asthma    Past Surgical History:  Procedure Laterality Date   DENTAL SURGERY     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/02/2022   Procedure: TOTAL KNEE ARTHROPLASTY;  Surgeon: Carole Civil, MD;  Location: [facility];  Service: Orthopedics;  Laterality: Left;   Patient Active Problem List   Diagnosis Date Noted   Osteoarthritis of left knee 02/02/2022   Unilateral primary osteoarthritis, left knee     REFERRING DIAG: M25.562,G89.29 (ICD-10-CM) - Chronic pain of left knee M17.10 (ICD-10-CM) - Primary localized osteoarthritis of knee   THERAPY DIAG:  Left knee pain, unspecified chronicity  Stiffness of left knee, not elsewhere classified  Other abnormalities of gait and mobility  PERTINENT HISTORY: Lt TKA 02/02/22  PRECAUTIONS: None  SUBJECTIVE: Patient says she is doing ok, she is a little more sore today . She has been walking more lately without cane but caused knee to be sore. Not really pain but sore today.   PAIN:  Are you having pain? Yes: NPRS scale: 4/10 Pain location: Lt knee Pain description: tired, aching  Aggravating factors: Standing, WB Relieving factors: Moving,  rest, ice  OBJECTIVE:   (italics = form eval)  DIAGNOSTIC FINDINGS: NA   PATIENT SURVEYS:  FOTO 66% function     COGNITION:           Overall cognitive status: Within functional limits for tasks assessed                          SENSATION: WFL     PALPATION: Mod TTP about LT adductors, quad and calf    LE ROM:   Active ROM Left 02/19/22 Left 03/08/22  Knee flexion 93 110  Knee extension -8 -6     LE MMT:   MMT Right 02/19/2022 Left 02/19/2022 Left 03/11/2022  Hip flexion 5 4 4+  Hip extension       Hip abduction       Hip adduction       Hip internal rotation       Hip external rotation       Knee flexion 5 4+ 5  Knee extension 5 4 4+  Ankle dorsiflexion 5 4+ 5  Ankle plantarflexion       Ankle inversion       Ankle eversion        (Blank rows = not tested)     GAIT: Using SPC, slight decreased stride, non antalgic  TODAY'S TREATMENT:  03/11/22   Bike seat 8 (full revolution) 4 min    Calf stretch 3 x 30" slant board    Heel raise 20X    Step ups 6 inch 20 HHA x1 Step downs 6 inch x15 HHA x 1    Sit to stand (eccentric lowering) x15  Standing hip abduction/ extension RTB 2 x 10 each    Step down 4 inch 20 HHA x 1     AROM: -6 to 110 degrees   03/08/22 Bike seat 8 (full revolution) 4 min  Standing: Calf stretch 3 x 30" slant board  Heel raise 20X Toe raise 20X Knee drive 12 inch 10 x 5" Step ups 6 inch 20 HHA x1  Step down 4 inch 20 HHA x 1  Tandem stance 3 x 30" on foam   TKE with bodycraft 3PL 2X10 Lt LE AROM: -6 to 110 degrees   03/01/22 Bike seat 9 (full revolution) 4 min  Calf stretch 3 x 30" slant board  Heel raise 2 x 10 Knee driver 8 inch 10 x 5" Step ups 6 inch 2 x 10 HHA x1  Step down 4 inch 2 x 10 HHA x 1  Tandem stance 2 x 30" on foam      (LT knee AROM: -5 to 107 degrees)   02/25/22 Bike seat 9 (full revolution) 5 min  Calf stretch 3 x 30" slant board  Heel raise 2 x 10 Knee driver 6 inch 10 x 5" Step ups 4 inch 2  x 10 Tandem stance 3 x 30"  Standing hip abduction 2 x 10 Sit to stands x10 Gait 1 RT in clinic with cane   Manual knee PROM knee flexion and extension (LT knee AROM: -5 to 108 degrees)       PATIENT EDUCATION:  Education details: on exercise form and function, progress to goals  Person educated: Patient Education method: Customer service manager Education comprehension: verbalized understanding and returned demonstration     HOME EXERCISE PROGRAM: Reviewed HEP from [facility] including : Quad set SLR Heel slide Ankle pump Heel prop  Heel raise Sit to stand    ASSESSMENT:   CLINICAL IMPRESSION: Patient progressing well. Pain is well managed. Improved gait, now able to walk with SPC and occasionally with no AD. Good stability but still needs some work on dynamic balance. Good strength overall, mostly limited by mild AROM restrictions at this time. Patient will continue to benefit from skilled Therapy services to address remaining deficits for reduced pain and improved LOF with ADLs.  OBJECTIVE IMPAIRMENTS Abnormal gait, decreased activity tolerance, decreased balance, decreased endurance, decreased mobility, difficulty walking, decreased ROM, decreased strength, hypomobility, increased edema, increased fascial restrictions, impaired perceived functional ability, impaired flexibility, improper body mechanics, and pain.    ACTIVITY LIMITATIONS cleaning, community activity, driving, occupation, Medical sales representative, yard work, shopping, and yard work.    PERSONAL FACTORS  None  are also affecting patient's functional outcome.      REHAB POTENTIAL: Good   CLINICAL DECISION MAKING: Stable/uncomplicated   EVALUATION COMPLEXITY: Low     GOALS: SHORT TERM GOALS: Target date: 03/12/2022   Patient will be independent with initial HEP and self-management strategies to improve functional outcomes Baseline:  Goal status: MET   LONG TERM GOALS: Target date: 04/02/2022   Patient will be  independent with advanced HEP and self-management strategies to improve functional outcomes Baseline:  Goal status: Ongoing   2.  Patient will improve FOTO score to predicted  value to indicate improvement in functional outcomes Baseline: 65% Goal status: Ongoing   3.  Patient will have LT knee AROM 0-120 degrees to improve functional mobility and facilitate squatting to pick up items from floor. Baseline: -6 to 110 degrees Goal status: Ongoing   4. Patient will have equal to or > 4+/5 MMT throughout LLE to improve ability to perform functional mobility, stair ambulation and ADLs.  Baseline: See MMT Goal status: MET       PLAN: PT FREQUENCY: 2x/week   PT DURATION: 6 weeks   PLANNED INTERVENTIONS: Therapeutic exercises, Therapeutic activity, Neuromuscular re-education, Balance training, Gait training, Patient/Family education, Joint manipulation, Joint mobilization, Stair training, Aquatic Therapy, Dry Needling, Electrical stimulation, Spinal manipulation, Spinal mobilization, Cryotherapy, Moist heat, scar mobilization, Taping, Traction, Ultrasound, Biofeedback, Ionotophoresis 49m/ml Dexamethasone, and Manual therapy.    PLAN FOR NEXT SESSION: Progress knee AROM and strengthening. Standing there ex, gait and balance. Manual as needed for pain and edema   11:10 AM, 03/11/22 CJosue HectorPT DPT  [facility] ([phone]

## 2022-03-11 NOTE — Progress Notes (Signed)
Chief Complaint  Patient presents with   Routine Post Op    LT TKR  DOS 02/02/22    Encounter Diagnoses  Name Primary?   Status post total left knee replacement February 02, 2022 Yes   Primary localized osteoarthritis of knee    Kelly Fox continues to improve she is got 110 degrees of knee flexion she does have a slight flexion contracture but is less than 5 degrees she has some weakness in the quads her knee and skin have cleared up from the significant ecchymosis  She is encouraged to ice the knee at night  Use the Cryo/Cuff or frozen vegetables for that continue her exercises at night see me in 3 months

## 2022-03-15 ENCOUNTER — Ambulatory Visit (HOSPITAL_COMMUNITY): Payer: Medicare Other | Admitting: Physical Therapy

## 2022-03-15 DIAGNOSIS — M25562 Pain in left knee: Secondary | ICD-10-CM

## 2022-03-15 DIAGNOSIS — M25662 Stiffness of left knee, not elsewhere classified: Secondary | ICD-10-CM | POA: Diagnosis not present

## 2022-03-15 DIAGNOSIS — R2689 Other abnormalities of gait and mobility: Secondary | ICD-10-CM | POA: Diagnosis not present

## 2022-03-15 NOTE — Therapy (Signed)
OUTPATIENT PHYSICAL THERAPY TREATMENT NOTE   Patient Name: Kelly Fox MRN: [id number] DOB:01-Aug-1951, 71 y.o., female Today's Date: 03/15/2022    PCP: Allyn Kenner MD  REFERRING PROVIDER: [name] MD  END OF SESSION:   PT End of Session - 03/15/22 1552     Visit Number 7    Number of Visits 12    Date for PT Re-Evaluation 04/02/22    Authorization Type [employer] Medicare (No VL/ no auth)    Progress Note Due on Visit 10    PT Start Time 4315    PT Stop Time 1616    PT Time Calculation (min) 38 min    Activity Tolerance Patient tolerated treatment well    Behavior During Therapy WFL for tasks assessed/performed             Past Medical History:  Diagnosis Date   Asthma    Past Surgical History:  Procedure Laterality Date   DENTAL SURGERY     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/02/2022   Procedure: TOTAL KNEE ARTHROPLASTY;  Surgeon: Carole Civil, MD;  Location: [facility];  Service: Orthopedics;  Laterality: Left;   Patient Active Problem List   Diagnosis Date Noted   Osteoarthritis of left knee 02/02/2022   Unilateral primary osteoarthritis, left knee     REFERRING DIAG: M25.562,G89.29 (ICD-10-CM) - Chronic pain of left knee M17.10 (ICD-10-CM) - Primary localized osteoarthritis of knee   THERAPY DIAG:  Left knee pain, unspecified chronicity  Stiffness of left knee, not elsewhere classified  Other abnormalities of gait and mobility  PERTINENT HISTORY: Lt TKA 02/02/22  PRECAUTIONS: None  SUBJECTIVE: Patient says she is doing good today without pain.   PAIN:  Are you having pain? Yes: NPRS scale: 0/10 Pain location: Lt knee Pain description: tired, aching  Aggravating factors: Standing, WB Relieving factors: Moving, rest, ice  OBJECTIVE:   (italics = form eval)  DIAGNOSTIC FINDINGS: NA   PATIENT SURVEYS:  FOTO 66% function     COGNITION:           Overall cognitive status: Within functional limits for tasks assessed                           SENSATION: WFL     PALPATION: Mod TTP about LT adductors, quad and calf    LE ROM:   Active ROM Left 02/19/22 Left 03/08/22  Knee flexion 93 110  Knee extension -8 -6     LE MMT:   MMT Right 02/19/2022 Left 02/19/2022 Left 03/11/2022  Hip flexion 5 4 4+  Hip extension       Hip abduction       Hip adduction       Hip internal rotation       Hip external rotation       Knee flexion 5 4+ 5  Knee extension 5 4 4+  Ankle dorsiflexion 5 4+ 5  Ankle plantarflexion       Ankle inversion       Ankle eversion        (Blank rows = not tested)     GAIT: Using SPC, slight decreased stride, non antalgic        TODAY'S TREATMENT:  03/11/22 Bike seat 8 (full revolution) 4 min  Standing: Calf stretch 3 x 30" slant board  Heel raise 20X  Knee flexion stretch on 12" step 10X10" Hamstring stretch on 12" step 3X30" Standing hip abduction/ extension 2  x 10 each working on form  Tandem stance 30" X 2 on foam each LE lead SLS max of Rt:20"  Lt:15"  Seated: Sit to stand (eccentric lowering) x15   03/11/22   Bike seat 8 (full revolution) 4 min    Calf stretch 3 x 30" slant board    Heel raise 20X    Step ups 6 inch 20 HHA x1 Step downs 6 inch x15 HHA x 1    Sit to stand (eccentric lowering) x15  Standing hip abduction/ extension RTB 2 x 10 each    Step down 4 inch 20 HHA x 1   AROM: -6 to 110 degrees   03/08/22 Bike seat 8 (full revolution) 4 min  Standing: Calf stretch 3 x 30" slant board  Heel raise 20X Toe raise 20X Knee drive 12 inch 10 x 5" Step ups 6 inch 20 HHA x1  Step down 4 inch 20 HHA x 1  Tandem stance 3 x 30" on foam   TKE with bodycraft 3PL 2X10 Lt LE AROM: -6 to 110 degrees   03/01/22 Bike seat 9 (full revolution) 4 min  Calf stretch 3 x 30" slant board  Heel raise 2 x 10 Knee driver 8 inch 10 x 5" Step ups 6 inch 2 x 10 HHA x1  Step down 4 inch 2 x 10 HHA x 1  Tandem stance 2 x 30" on foam      (LT knee AROM: -5 to 107 degrees)    PATIENT  EDUCATION:  Education details: on exercise form and function, progress to goals  Person educated: Patient Education method: Customer service manager Education comprehension: verbalized understanding and returned demonstration     HOME EXERCISE PROGRAM: Reviewed HEP from [facility] including : Quad set SLR Heel slide Ankle pump Heel prop  Heel raise Sit to stand    ASSESSMENT:   CLINICAL IMPRESSION: Continued with focus on improving ROM and functional strength.Worked on form while completing standing hip exercises as tends to substitute with stronger mm and postural deviations.  Began tandem stance on foam and SLS to challenge balance.  Patient will continue to benefit from skilled Therapy services to address remaining deficits for reduced pain and improved LOF with ADLs.   OBJECTIVE IMPAIRMENTS Abnormal gait, decreased activity tolerance, decreased balance, decreased endurance, decreased mobility, difficulty walking, decreased ROM, decreased strength, hypomobility, increased edema, increased fascial restrictions, impaired perceived functional ability, impaired flexibility, improper body mechanics, and pain.    ACTIVITY LIMITATIONS cleaning, community activity, driving, occupation, Medical sales representative, yard work, shopping, and yard work.    PERSONAL FACTORS  None  are also affecting patient's functional outcome.      REHAB POTENTIAL: Good   CLINICAL DECISION MAKING: Stable/uncomplicated   EVALUATION COMPLEXITY: Low     GOALS: SHORT TERM GOALS: Target date: 03/12/2022   Patient will be independent with initial HEP and self-management strategies to improve functional outcomes Baseline:  Goal status: MET   LONG TERM GOALS: Target date: 04/02/2022   Patient will be independent with advanced HEP and self-management strategies to improve functional outcomes Baseline:  Goal status: Ongoing   2.  Patient will improve FOTO score to predicted value to indicate improvement in functional  outcomes Baseline: 65% Goal status: Ongoing   3.  Patient will have LT knee AROM 0-120 degrees to improve functional mobility and facilitate squatting to pick up items from floor. Baseline: -6 to 110 degrees Goal status: Ongoing   4. Patient will have equal to or >  4+/5 MMT throughout LLE to improve ability to perform functional mobility, stair ambulation and ADLs.  Baseline: See MMT Goal status: MET       PLAN: PT FREQUENCY: 2x/week   PT DURATION: 6 weeks   PLANNED INTERVENTIONS: Therapeutic exercises, Therapeutic activity, Neuromuscular re-education, Balance training, Gait training, Patient/Family education, Joint manipulation, Joint mobilization, Stair training, Aquatic Therapy, Dry Needling, Electrical stimulation, Spinal manipulation, Spinal mobilization, Cryotherapy, Moist heat, scar mobilization, Taping, Traction, Ultrasound, Biofeedback, Ionotophoresis 79m/ml Dexamethasone, and Manual therapy.    PLAN FOR NEXT SESSION: Progress knee AROM and strengthening. Progress to dynamic balance challenges.   3:52 PM, 03/15/22 ATeena Irani PTA/CLT CSenecaPh: [phone]

## 2022-03-18 ENCOUNTER — Ambulatory Visit (HOSPITAL_COMMUNITY): Payer: Medicare Other | Admitting: Physical Therapy

## 2022-03-18 DIAGNOSIS — R2689 Other abnormalities of gait and mobility: Secondary | ICD-10-CM | POA: Diagnosis not present

## 2022-03-18 DIAGNOSIS — M25562 Pain in left knee: Secondary | ICD-10-CM | POA: Diagnosis not present

## 2022-03-18 DIAGNOSIS — M25662 Stiffness of left knee, not elsewhere classified: Secondary | ICD-10-CM | POA: Diagnosis not present

## 2022-03-18 NOTE — Therapy (Signed)
OUTPATIENT PHYSICAL THERAPY TREATMENT NOTE   Patient Name: Kelly Fox MRN: [id number] DOB:1951-04-30, 71 y.o., female Today's Date: 03/18/2022    PCP: Allyn Kenner MD  REFERRING PROVIDER: [name] MD  END OF SESSION:   PT End of Session - 03/18/22 1017     Visit Number 8    Number of Visits 12    Date for PT Re-Evaluation 04/02/22    Authorization Type [employer] Medicare (No VL/ no auth)    Progress Note Due on Visit 10    PT Start Time 1010    PT Stop Time 1050    PT Time Calculation (min) 40 min    Activity Tolerance Patient tolerated treatment well    Behavior During Therapy WFL for tasks assessed/performed             Past Medical History:  Diagnosis Date   Asthma    Past Surgical History:  Procedure Laterality Date   DENTAL SURGERY     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/02/2022   Procedure: TOTAL KNEE ARTHROPLASTY;  Surgeon: Carole Civil, MD;  Location: [facility];  Service: Orthopedics;  Laterality: Left;   Patient Active Problem List   Diagnosis Date Noted   Osteoarthritis of left knee 02/02/2022   Unilateral primary osteoarthritis, left knee     REFERRING DIAG: M25.562,G89.29 (ICD-10-CM) - Chronic pain of left knee M17.10 (ICD-10-CM) - Primary localized osteoarthritis of knee   THERAPY DIAG:  Left knee pain, unspecified chronicity  Stiffness of left knee, not elsewhere classified  Other abnormalities of gait and mobility  PERTINENT HISTORY: Lt TKA 02/02/22  PRECAUTIONS: None  SUBJECTIVE: Patient says she is doing good today without pain.   PAIN:  Are you having pain? Yes: NPRS scale: 0/10 Pain location: Lt knee Pain description: tired, aching  Aggravating factors: Standing, WB Relieving factors: Moving, rest, ice  OBJECTIVE:   (italics = form eval)  DIAGNOSTIC FINDINGS: NA   PATIENT SURVEYS:  FOTO 66% function     COGNITION:           Overall cognitive status: Within functional limits for tasks assessed                           SENSATION: WFL     PALPATION: Mod TTP about LT adductors, quad and calf    LE ROM:   Active ROM Left 02/19/22 Left 03/08/22 Left 03/18/22  Knee flexion 93 110 115  Knee extension -8 -6 -5     LE MMT:   MMT Right 02/19/2022 Left 02/19/2022 Left 03/11/2022  Hip flexion 5 4 4+  Hip extension       Hip abduction       Hip adduction       Hip internal rotation       Hip external rotation       Knee flexion 5 4+ 5  Knee extension 5 4 4+  Ankle dorsiflexion 5 4+ 5  Ankle plantarflexion       Ankle inversion       Ankle eversion        (Blank rows = not tested)     GAIT: Using SPC, slight decreased stride, non antalgic        TODAY'S TREATMENT:  03/11/22 Bike seat 8 (full revolution) 4 min  Standing: Calf stretch 3 x 30" slant board   Knee flexion stretch on 12" step 10X10"  Hamstring stretch on 12" box 3X30" Heel raise on  incline 20X  Lt knee flexion 15X Standing hip abduction/ extension 2 x 10 each working on form  Vectors 5X5" each with 1 HHA 'AROM: supine -5 TO 115  03/11/22 Bike seat 8 (full revolution) 4 min  Standing: Calf stretch 3 x 30" slant board  Heel raise 20X  Knee flexion stretch on 12" step 10X10" Hamstring stretch on 12" step 3X30" Standing hip abduction/ extension 2 x 10 each working on form  Tandem stance 30" X 2 on foam each LE lead SLS max of Rt:20"  Lt:15"  Seated: Sit to stand (eccentric lowering) x15   03/11/22   Bike seat 8 (full revolution) 4 min    Calf stretch 3 x 30" slant board    Heel raise 20X    Step ups 6 inch 20 HHA x1 Step downs 6 inch x15 HHA x 1    Sit to stand (eccentric lowering) x15  Standing hip abduction/ extension RTB 2 x 10 each    Step down 4 inch 20 HHA x 1   AROM: -6 to 110 degrees   03/08/22 Bike seat 8 (full revolution) 4 min  Standing: Calf stretch 3 x 30" slant board  Heel raise 20X Toe raise 20X Knee drive 12 inch 10 x 5" Step ups 6 inch 20 HHA x1  Step down 4 inch 20 HHA x 1  Tandem stance 3  x 30" on foam   TKE with [employer] 3PL 2X10 Lt LE AROM: -6 to 110 degrees   03/01/22 Bike seat 9 (full revolution) 4 min  Calf stretch 3 x 30" slant board  Heel raise 2 x 10 Knee driver 8 inch 10 x 5" Step ups 6 inch 2 x 10 HHA x1  Step down 4 inch 2 x 10 HHA x 1  Tandem stance 2 x 30" on foam      (LT knee AROM: -5 to 107 degrees)    PATIENT EDUCATION:  Education details: on exercise form and function, progress to goals  Person educated: Patient Education method: Customer service manager Education comprehension: verbalized understanding and returned demonstration     HOME EXERCISE PROGRAM: Reviewed HEP from [facility] including : Quad set, SLR,Heel slide,Ankle pump,Heel prop,Heel raise,Sit to stand     ASSESSMENT:   CLINICAL IMPRESSION: Continued with focus on improving ROM and functional strength.  Added vectors to work on standing stability.  Cues to reduce postural deviations.  Pt brings SPC to visit but only uses it for community ambulation at this point.  ROM remeasured today in supine -5 to 115, improved since last measurement.  Pt also has extension lag of -3 in Rt knee.  Patient will continue to benefit from skilled Therapy services to address remaining deficits for reduced pain and improved LOF with ADLs.   OBJECTIVE IMPAIRMENTS Abnormal gait, decreased activity tolerance, decreased balance, decreased endurance, decreased mobility, difficulty walking, decreased ROM, decreased strength, hypomobility, increased edema, increased fascial restrictions, impaired perceived functional ability, impaired flexibility, improper body mechanics, and pain.    ACTIVITY LIMITATIONS cleaning, community activity, driving, occupation, Medical sales representative, yard work, shopping, and yard work.    PERSONAL FACTORS  None  are also affecting patient's functional outcome.      REHAB POTENTIAL: Good   CLINICAL DECISION MAKING: Stable/uncomplicated   EVALUATION COMPLEXITY: Low     GOALS: SHORT TERM GOALS:  Target date: 03/12/2022   Patient will be independent with initial HEP and self-management strategies to improve functional outcomes Baseline:  Goal status: MET   LONG  TERM GOALS: Target date: 04/02/2022   Patient will be independent with advanced HEP and self-management strategies to improve functional outcomes Baseline:  Goal status: Ongoing   2.  Patient will improve FOTO score to predicted value to indicate improvement in functional outcomes Baseline: 65% Goal status: Ongoing   3.  Patient will have LT knee AROM 0-120 degrees to improve functional mobility and facilitate squatting to pick up items from floor. Baseline: -6 to 110 degrees Goal status: Ongoing   4. Patient will have equal to or > 4+/5 MMT throughout LLE to improve ability to perform functional mobility, stair ambulation and ADLs.  Baseline: See MMT Goal status: MET       PLAN: PT FREQUENCY: 2x/week   PT DURATION: 6 weeks   PLANNED INTERVENTIONS: Therapeutic exercises, Therapeutic activity, Neuromuscular re-education, Balance training, Gait training, Patient/Family education, Joint manipulation, Joint mobilization, Stair training, Aquatic Therapy, Dry Needling, Electrical stimulation, Spinal manipulation, Spinal mobilization, Cryotherapy, Moist heat, scar mobilization, Taping, Traction, Ultrasound, Biofeedback, Ionotophoresis 67m/ml Dexamethasone, and Manual therapy.    PLAN FOR NEXT SESSION: Progress knee AROM and strengthening. Progress to dynamic balance challenges.   10:53 AM, 03/18/22 ATeena Irani PTA/CLT C[place]wayPh: [phone]

## 2022-03-22 ENCOUNTER — Encounter (HOSPITAL_COMMUNITY): Payer: Medicare Other | Admitting: Physical Therapy

## 2022-03-25 ENCOUNTER — Encounter (HOSPITAL_COMMUNITY): Payer: Self-pay | Admitting: Physical Therapy

## 2022-03-25 ENCOUNTER — Ambulatory Visit (HOSPITAL_COMMUNITY): Payer: Medicare Other | Attending: Orthopedic Surgery | Admitting: Physical Therapy

## 2022-03-25 DIAGNOSIS — M25562 Pain in left knee: Secondary | ICD-10-CM | POA: Insufficient documentation

## 2022-03-25 DIAGNOSIS — M25662 Stiffness of left knee, not elsewhere classified: Secondary | ICD-10-CM | POA: Insufficient documentation

## 2022-03-25 DIAGNOSIS — R2689 Other abnormalities of gait and mobility: Secondary | ICD-10-CM | POA: Insufficient documentation

## 2022-03-25 NOTE — Therapy (Signed)
OUTPATIENT PHYSICAL THERAPY TREATMENT NOTE   Patient Name: Kelly Fox MRN: [id number] DOB:03-16-1951, 71 y.o., female Today's Date: 03/25/2022    PCP: Allyn Kenner MD  REFERRING PROVIDER: [name] MD  END OF SESSION:   PT End of Session - 03/25/22 0955     Visit Number 9    Number of Visits 12    Date for PT Re-Evaluation 04/02/22    Authorization Type [employer] Medicare (No VL/ no auth)    Progress Note Due on Visit 10    PT Start Time 0955    PT Stop Time 1025    PT Time Calculation (min) 30 min    Activity Tolerance Patient tolerated treatment well    Behavior During Therapy [facility] for tasks assessed/performed             Past Medical History:  Diagnosis Date   Asthma    Past Surgical History:  Procedure Laterality Date   DENTAL SURGERY     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/02/2022   Procedure: TOTAL KNEE ARTHROPLASTY;  Surgeon: Carole Civil, MD;  Location: [facility];  Service: Orthopedics;  Laterality: Left;   Patient Active Problem List   Diagnosis Date Noted   Osteoarthritis of left knee 02/02/2022   Unilateral primary osteoarthritis, left knee     REFERRING DIAG: M25.562,G89.29 (ICD-10-CM) - Chronic pain of left knee M17.10 (ICD-10-CM) - Primary localized osteoarthritis of knee   THERAPY DIAG:  Left knee pain, unspecified chronicity  Stiffness of left knee, not elsewhere classified  Other abnormalities of gait and mobility  PERTINENT HISTORY: Lt TKA 02/02/22  PRECAUTIONS: None  SUBJECTIVE: Doing well, no new issues. Walking better.   PAIN:  Are you having pain? No   OBJECTIVE:   (italics = form eval)  DIAGNOSTIC FINDINGS: NA   PATIENT SURVEYS:  FOTO 66% function     COGNITION:           Overall cognitive status: Within functional limits for tasks assessed                          SENSATION: WFL     PALPATION: Mod TTP about LT adductors, quad and calf    LE ROM:   Active ROM Left 02/19/22 Left 03/08/22 Left 03/18/22  Knee  flexion 93 110 115  Knee extension -8 -6 -5     LE MMT:   MMT Right 02/19/2022 Left 02/19/2022 Left 03/11/2022  Hip flexion 5 4 4+  Hip extension       Hip abduction       Hip adduction       Hip internal rotation       Hip external rotation       Knee flexion 5 4+ 5  Knee extension 5 4 4+  Ankle dorsiflexion 5 4+ 5  Ankle plantarflexion       Ankle inversion       Ankle eversion        (Blank rows = not tested)     GAIT: Using SPC, slight decreased stride, non antalgic        TODAY'S TREATMENT:  03/25/22   Recumbent bike seat 8 4 min     Stairs 4 inch 3 RT recip hand rail x 1   Stairs 7 inch 3 RT recip hand rail x 1 (decreased eccentric control)    Heel raise form slope x 20   Mini squat 2 x 10    TKE  3 plates x 20   SLS vector 3 x 5" 3 way (finger touch assist)       Quad set with manual OP for knee extension 10 x 5"  Heel slides x 10   (LT knee AROM -5 to 113 degrees)   03/11/22 Bike seat 8 (full revolution) 4 min  Standing: Calf stretch 3 x 30" slant board   Knee flexion stretch on 12" step 10X10"  Hamstring stretch on 12" box 3X30" Heel raise on incline 20X  Lt knee flexion 15X Standing hip abduction/ extension 2 x 10 each working on form  Vectors 5X5" each with 1 HHA 'AROM: supine -5 TO 115    PATIENT EDUCATION:  Education details: on exercise form and function, progress to goals  Person educated: Patient Education method: Customer service manager Education comprehension: verbalized understanding and returned demonstration     HOME EXERCISE PROGRAM: Reviewed HEP from [facility] including : Quad set, SLR,Heel slide,Ankle pump,Heel prop,Heel raise,Sit to stand     ASSESSMENT:   CLINICAL IMPRESSION: Patient showing improved functional ability overall. Good strength, though remains limited by knee extension restrictions and is challenged with balance tasks. She does show improved stair ambulation. She admits to non compliance with knee extension  and  hamstring stretching exercises. Encouraged increased performance at home for improved knee AROM. Patient will continue to benefit from skilled therapy services to reduce remaining deficits and improve functional ability.     OBJECTIVE IMPAIRMENTS Abnormal gait, decreased activity tolerance, decreased balance, decreased endurance, decreased mobility, difficulty walking, decreased ROM, decreased strength, hypomobility, increased edema, increased fascial restrictions, impaired perceived functional ability, impaired flexibility, improper body mechanics, and pain.    ACTIVITY LIMITATIONS cleaning, community activity, driving, occupation, Medical sales representative, yard work, shopping, and yard work.    PERSONAL FACTORS  None  are also affecting patient's functional outcome.      REHAB POTENTIAL: Good   CLINICAL DECISION MAKING: Stable/uncomplicated   EVALUATION COMPLEXITY: Low     GOALS: SHORT TERM GOALS: Target date: 03/12/2022   Patient will be independent with initial HEP and self-management strategies to improve functional outcomes Baseline:  Goal status: MET   LONG TERM GOALS: Target date: 04/02/2022   Patient will be independent with advanced HEP and self-management strategies to improve functional outcomes Baseline:  Goal status: Ongoing   2.  Patient will improve FOTO score to predicted value to indicate improvement in functional outcomes Baseline: 65% Goal status: Ongoing   3.  Patient will have LT knee AROM 0-120 degrees to improve functional mobility and facilitate squatting to pick up items from floor. Baseline: -6 to 110 degrees Goal status: Ongoing   4. Patient will have equal to or > 4+/5 MMT throughout LLE to improve ability to perform functional mobility, stair ambulation and ADLs.  Baseline: See MMT Goal status: MET       PLAN: PT FREQUENCY: 2x/week   PT DURATION: 6 weeks   PLANNED INTERVENTIONS: Therapeutic exercises, Therapeutic activity, Neuromuscular re-education,  Balance training, Gait training, Patient/Family education, Joint manipulation, Joint mobilization, Stair training, Aquatic Therapy, Dry Needling, Electrical stimulation, Spinal manipulation, Spinal mobilization, Cryotherapy, Moist heat, scar mobilization, Taping, Traction, Ultrasound, Biofeedback, Ionotophoresis 50m/ml Dexamethasone, and Manual therapy.    PLAN FOR NEXT SESSION: Progress knee AROM and strengthening.   9:56 AM, 03/25/22 CJosue HectorPT DPT  Physical Therapist [facility] [phone]

## 2022-03-30 ENCOUNTER — Ambulatory Visit (HOSPITAL_COMMUNITY): Payer: Medicare Other | Attending: Internal Medicine | Admitting: Physical Therapy

## 2022-03-30 DIAGNOSIS — M1710 Unilateral primary osteoarthritis, unspecified knee: Secondary | ICD-10-CM | POA: Insufficient documentation

## 2022-03-30 DIAGNOSIS — Z96652 Presence of left artificial knee joint: Secondary | ICD-10-CM | POA: Diagnosis not present

## 2022-03-30 DIAGNOSIS — M25562 Pain in left knee: Secondary | ICD-10-CM | POA: Diagnosis not present

## 2022-03-30 DIAGNOSIS — M25662 Stiffness of left knee, not elsewhere classified: Secondary | ICD-10-CM

## 2022-03-30 DIAGNOSIS — R2689 Other abnormalities of gait and mobility: Secondary | ICD-10-CM

## 2022-03-30 DIAGNOSIS — G8929 Other chronic pain: Secondary | ICD-10-CM | POA: Diagnosis not present

## 2022-03-30 NOTE — Therapy (Signed)
OUTPATIENT PHYSICAL THERAPY TREATMENT NOTE   Patient Name: Kelly Fox MRN: [id number] DOB:10-17-1951, 71 y.o., female Today's Date: 03/30/2022    PCP: Allyn Kenner MD  REFERRING PROVIDER: [name] MD  END OF SESSION:   PT End of Session - 03/30/22 0847     Visit Number 10    Number of Visits 12    Date for PT Re-Evaluation 04/02/22    Authorization Type [employer] Medicare (No VL/ no auth)    Progress Note Due on Visit 10    PT Start Time 0835    PT Stop Time 0858    PT Time Calculation (min) 23 min    Activity Tolerance Patient tolerated treatment well    Behavior During Therapy [facility] for tasks assessed/performed             Past Medical History:  Diagnosis Date   Asthma    Past Surgical History:  Procedure Laterality Date   DENTAL SURGERY     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/02/2022   Procedure: TOTAL KNEE ARTHROPLASTY;  Surgeon: Carole Civil, MD;  Location: [facility];  Service: Orthopedics;  Laterality: Left;   Patient Active Problem List   Diagnosis Date Noted   Osteoarthritis of left knee 02/02/2022   Unilateral primary osteoarthritis, left knee     REFERRING DIAG: M25.562,G89.29 (ICD-10-CM) - Chronic pain of left knee M17.10 (ICD-10-CM) - Primary localized osteoarthritis of knee   THERAPY DIAG:  Left knee pain, unspecified chronicity  Stiffness of left knee, not elsewhere classified  Other abnormalities of gait and mobility  PERTINENT HISTORY: Lt TKA 02/02/22  PRECAUTIONS: None  SUBJECTIVE: Pt reports no pain, just some tingling like her leg is asleep at times.  Reports stiffness in the morning when she first gets up but otherwise back to normal.  Pt states she is ready for discharge at this time.   PAIN:  Are you having pain? No   OBJECTIVE:   (italics = form eval)  DIAGNOSTIC FINDINGS: NA   PATIENT SURVEYS:  FOTO 03/30/22: 78% functional status (was 66% function)     COGNITION:           Overall cognitive status: Within functional limits  for tasks assessed                          SENSATION: WFL     PALPATION: Mod TTP about LT adductors, quad and calf    LE ROM:   Active ROM Left 02/19/22 Left 03/08/22 Left 03/18/22 Left 03/30/22  Knee flexion 93 110 115 115 (Rt is also 115)  Knee extension -8 -6 -5 -5 (Rt is -2 from neutral)     LE MMT:   MMT Right 02/19/2022 Left 02/19/2022 Left 03/11/2022 Left 03/30/22  Hip flexion 5 4 4+ 5  Hip extension        Hip abduction        Hip adduction        Hip internal rotation        Hip external rotation        Knee flexion 5 4+ 5 5  Knee extension 5 4 4+ 5  Ankle dorsiflexion 5 4+ 5 5  Ankle plantarflexion        Ankle inversion        Ankle eversion         (Blank rows = not tested)     GAIT: Using SPC, slight decreased stride, non antalgic  TODAY'S TREATMENT:  03/30/22  Progress note; discharge completed  03/25/22   Recumbent bike seat 8 4 min     Stairs 4 inch 3 RT recip hand rail x 1   Stairs 7 inch 3 RT recip hand rail x 1 (decreased eccentric control)    Heel raise form slope x 20   Mini squat 2 x 10    TKE 3 plates x 20   SLS vector 3 x 5" 3 way (finger touch assist)       Quad set with manual OP for knee extension 10 x 5"  Heel slides x 10   (LT knee AROM -5 to 113 degrees)   03/11/22 Bike seat 8 (full revolution) 4 min  Standing: Calf stretch 3 x 30" slant board   Knee flexion stretch on 12" step 10X10"  Hamstring stretch on 12" box 3X30" Heel raise on incline 20X  Lt knee flexion 15X Standing hip abduction/ extension 2 x 10 each working on form  Vectors 5X5" each with 1 HHA 'AROM: supine -5 TO 115    PATIENT EDUCATION:  Education details: 03/30/22: on continuing HEP, working on remaining extension. Person educated: Patient Education method: Customer service manager Education comprehension: verbalized understanding and returned demonstration     HOME EXERCISE PROGRAM: 03/30/22:  prone knee hang, extension against gravity,  functional strengthening  Reviewed HEP from [facility] including : Quad set, SLR,Heel slide,Ankle pump,Heel prop,Heel raise,Sit to stand     ASSESSMENT:   CLINICAL IMPRESSION: Progress note completed this session with resultant discharge from skilled therapy as all goals met.  ROM goal was deferred as patient has nearly the same ROM on Rt with only 3 degree difference in extension.  Did educate to continue working on improving her extension, however has no gait deficits or functional deficits as a result.  Pt is ready to be discharged at this time.  OBJECTIVE IMPAIRMENTS Abnormal gait, decreased activity tolerance, decreased balance, decreased endurance, decreased mobility, difficulty walking, decreased ROM, decreased strength, hypomobility, increased edema, increased fascial restrictions, impaired perceived functional ability, impaired flexibility, improper body mechanics, and pain.    ACTIVITY LIMITATIONS cleaning, community activity, driving, occupation, Medical sales representative, yard work, shopping, and yard work.    PERSONAL FACTORS  None  are also affecting patient's functional outcome.      REHAB POTENTIAL: Good   CLINICAL DECISION MAKING: Stable/uncomplicated   EVALUATION COMPLEXITY: Low     GOALS: SHORT TERM GOALS: Target date: 03/12/2022   Patient will be independent with initial HEP and self-management strategies to improve functional outcomes Baseline:  Goal status: MET   LONG TERM GOALS: Target date: 04/02/2022   Patient will be independent with advanced HEP and self-management strategies to improve functional outcomes Baseline:  Goal status: MET   2.  Patient will improve FOTO score to predicted value to indicate improvement in functional outcomes Baseline: 65% Goal status: MET   3.  Patient will have LT knee AROM 0-120 degrees to improve functional mobility and facilitate squatting to pick up items from floor. Baseline: -6 to 110 degrees Goal status: DEFERRED; Rt AROM is -2 to 115, Lt  is -5 to 115   4. Patient will have equal to or > 4+/5 MMT throughout LLE to improve ability to perform functional mobility, stair ambulation and ADLs.  Baseline: See MMT Goal status: MET       PLAN: PT FREQUENCY: 2x/week   PT DURATION: 6 weeks   PLANNED INTERVENTIONS: Therapeutic exercises, Therapeutic activity, Neuromuscular re-education, Balance training,  Gait training, Patient/Family education, Joint manipulation, Joint mobilization, Stair training, Aquatic Therapy, Dry Needling, Electrical stimulation, Spinal manipulation, Spinal mobilization, Cryotherapy, Moist heat, scar mobilization, Taping, Traction, Ultrasound, Biofeedback, Ionotophoresis 58m/ml Dexamethasone, and Manual therapy.    PLAN FOR NEXT SESSION: Discharge to HEP  9:11 AM, 03/30/22 ATeena Irani PTA/CLT CGannPh: [phone]

## 2022-04-01 ENCOUNTER — Encounter (HOSPITAL_COMMUNITY): Payer: Medicare Other

## 2022-06-10 ENCOUNTER — Ambulatory Visit (INDEPENDENT_AMBULATORY_CARE_PROVIDER_SITE_OTHER): Payer: Medicare Other | Admitting: Orthopedic Surgery

## 2022-06-10 ENCOUNTER — Encounter: Payer: Self-pay | Admitting: Orthopedic Surgery

## 2022-06-10 DIAGNOSIS — Z96652 Presence of left artificial knee joint: Secondary | ICD-10-CM | POA: Diagnosis not present

## 2022-06-10 NOTE — Progress Notes (Signed)
Chief Complaint  Patient presents with   Post-op Follow-up    Left knee replaced 02/02/22   Routine follow-up in 4 months doing well ambulatory without assistive device now her left leg is longer than her right leg but she is doing well she has good range of motion no signs of infection excellent progress follow-up in 3 months

## 2022-09-02 ENCOUNTER — Ambulatory Visit: Payer: Medicare Other | Admitting: Orthopedic Surgery

## 2022-09-02 ENCOUNTER — Encounter: Payer: Self-pay | Admitting: Orthopedic Surgery

## 2022-09-02 DIAGNOSIS — Z96652 Presence of left artificial knee joint: Secondary | ICD-10-CM | POA: Diagnosis not present

## 2022-09-02 NOTE — Progress Notes (Signed)
Chief Complaint  Patient presents with   Post-op Follow-up    April 11th 23 L total knee has been sore with increased activity    Routine follow-up left total knee at 6 months she is doing well wants to have her other knee done  No complaints really at this time  Ambulates without assistive devices excellent range of motion in her knee  Encounter Diagnosis  Name Primary?   Status post total left knee replacement February 02, 2022 Yes

## 2022-10-07 ENCOUNTER — Ambulatory Visit: Payer: Medicare Other | Admitting: Orthopedic Surgery

## 2022-10-07 ENCOUNTER — Encounter: Payer: Self-pay | Admitting: Orthopedic Surgery

## 2022-10-07 DIAGNOSIS — Z96652 Presence of left artificial knee joint: Secondary | ICD-10-CM | POA: Diagnosis not present

## 2022-10-07 DIAGNOSIS — M25862 Other specified joint disorders, left knee: Secondary | ICD-10-CM | POA: Diagnosis not present

## 2022-10-07 DIAGNOSIS — Z01818 Encounter for other preprocedural examination: Secondary | ICD-10-CM

## 2022-10-07 NOTE — Patient Instructions (Addendum)
Diagnosis is patellar clunk syndrome  You will be scheduled for arthroscopic removal of scar tissue left knee  Expect about 3 to 4-week recovery  Your surgery will be at [facility] by Dr Aline Brochure  The hospital will contact you with a preoperative appointment to discuss Anesthesia.  Please arrive on time or 15 minutes early for the preoperative appointment, they have a very tight schedule if you are late or do not come in your surgery will be cancelled.  The phone number is [phone]. Please bring your medications with you for the appointment. They will tell you the arrival time and medication instructions when you have your preoperative evaluation. Do not wear nail polish the day of your surgery and if you take Phentermine you need to stop this medication ONE WEEK prior to your surgery. If you take Augustina Mood, Jardiance, or Steglatro) - Hold 72 hours before the procedure.  If you take Ozempic,  Bydureon or Trulicity do not take for 8 days before your surgery. If you take Victoza, Rybelsis, Saxenda or Adlyxi stop 24 hours before the procedure.  Please arrive at the hospital 2 hours before procedure if scheduled at 9:30 or later in the day or at the time the nurse tells you at your preoperative visit.   If you have my chart do not use the time given in my chart use the time given to you by the nurse during your preoperative visit.   Your surgery  time may change. Please be available for phone calls the day of your surgery and the day before. The Short Stay department may need to discuss changes about your surgery time. Not reaching the you could lead to procedure delays and possible cancellation.  You must have a ride home and someone to stay with you for 24 to 48 hours. The person taking you home will receive and sign for the your discharge instructions.  Please be prepared to give your support person's name and telephone number to Central Registration. Dr Aline Brochure will need that name  and phone number post procedure.

## 2022-10-07 NOTE — Progress Notes (Signed)
Chief Complaint  Patient presents with   Knee Pain    LT knee/ s/p TKA 01/2022 Knee is not painful with walking/ knee is sore when she gets up from sitting/ sore in front of knee-feels funny but not numb   71 year old female is 8 months postop.  She noticed increasing pain and crepitance in the front of the left knee especially when getting out of a chair.  Denies any history of trauma fever or increased warmth to the joint  Past Medical History:  Diagnosis Date   Asthma    Knee Musculoskeletal Exam Gait   Gait is normal.  Inspection   Left     Erythema: none       Effusion: none       Edema: none       Ecchymosis: none       Deformity: none       Alignment: normal       Previous incision: anterior     Incision: well-healed  Palpation   Left     Increased warmth: none       Masses: none       Crepitus: patellofemoral       Tenderness: none    Range of Motion   Left     Left knee range of motion is normal.    Strength   Left     Left knee strength is normal.    Instability   Left     Varus stress grade: normal     Valgus stress grade: normal     Anterior drawer: normal     Posterior drawer: normal  Neurovascular   Left     Left knee neurovascular exam is normal.    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   Status post total left knee replacement February 02, 2022 Yes   Patellar clunk syndrome of left knee      Discussed this with the patient.  It is causing her some difficulty getting out of a chair causing her to want to sit longer  Recommend arthroscopic removal of scar tissue from the patella  Patient agreeable to surgery and will be scheduled at her convenience postop 1 week  after surgery

## 2022-10-22 NOTE — Patient Instructions (Signed)
Kelly Fox  10/22/2022     '@[email or web address]'$ @   Your procedure is scheduled on  10/29/2022.   Report to [facility] at  0600  A.M.   Call this number if you have problems the morning of surgery:  [phone]  If you experience any cold or flu symptoms such as cough, fever, chills, shortness of breath, etc. between now and your scheduled surgery, please notify us at the above number.   Remember:  Do not eat or drink after midnight.      Take these medicines the morning of surgery with A SIP OF WATER                                            None.    Do not wear jewelry, make-up or nail polish.  Do not wear lotions, powders, or perfumes, or deodorant.  Do not shave 48 hours prior to surgery.  Men may shave face and neck.  Do not bring valuables to the hospital.  [facility] is not responsible for any belongings or valuables.  Contacts, dentures or bridgework may not be worn into surgery.  Leave your suitcase in the car.  After surgery it may be brought to your room.  For patients admitted to the hospital, discharge time will be determined by your treatment team.  Patients discharged the day of surgery will not be allowed to drive home and must have someone with them for 24 hours.    Special instructions:   DO NOT smoke tobacco or vape for 24 hours before your procedure.  Please read over the following fact sheets that you were given. Pain Booklet, Surgical Site Infection Prevention, Anesthesia Post-op Instructions, and Care and Recovery After Surgery      Arthroscopic Knee Ligament Repair, Care After This sheet gives you information about how to care for yourself after your procedure. Your health care provider may also give you more specific instructions. If you have problems or questions, contact your health care provider. What can I expect after the procedure? After the procedure, it is common to have: Soreness or pain in your knee. Bruising and  swelling on your knee, calf, and ankle for 3-4 days. A small amount of fluid coming from the incisions. Follow these instructions at home: Medicines Take over-the-counter and prescription medicines only as told by your health care provider. Ask your health care provider if the medicine prescribed to you: Requires you to avoid driving or using machinery. Can cause constipation. You may need to take these actions to prevent or treat constipation: Drink enough fluid to keep your urine pale yellow. Take over-the-counter or prescription medicines. Eat foods that are high in fiber, such as beans, whole grains, and fresh fruits and vegetables. Limit foods that are high in fat and processed sugars, such as fried or sweet foods. If you have a brace or immobilizer: Wear it as told by your health care provider. Remove it only as told by your health care provider. Loosen it if your toes tingle, become numb, or turn cold and blue. Keep it clean and dry. Ask your health care provider when it is safe to drive. Bathing Do not take baths, swim, or use a hot tub until your health care provider approves. Keep your bandage (dressing) dry until your health care provider says that it can  be removed. If the brace or immobilizer is not waterproof: Do not let it get wet. Cover it with a watertight covering when you take a bath or shower. Incision care  Follow instructions from your health care provider about how to take care of your incisions. Make sure you: Wash your hands with soap and water for at least 20 seconds before and after you change your dressing. If soap and water are not available, use hand sanitizer. Change your dressing as told by your health care provider. Leave stitches (sutures), skin glue, or adhesive strips in place. These skin closures may need to stay in place for 2 weeks or longer. If adhesive strip edges start to loosen and curl up, you may trim the loose edges. Do not remove adhesive  strips completely unless your health care provider tells you to do that. Check your incision areas every day for signs of infection. Check for: Redness. More swelling or pain. Blood or more fluid. Warmth. Pus or a bad smell. Managing pain, stiffness, and swelling  If directed, put ice on the affected area. To do this: If you have a removable brace or immobilizer, remove it as told by your health care provider. Put ice in a plastic bag. Place a towel between your skin and the bag. Leave the ice on for 20 minutes, 2-3 times a day. Remove the ice if your skin turns bright red. This is very important. If you cannot feel pain, heat, or cold, you have a greater risk of damage to the area. Move your toes often to reduce stiffness and swelling. Raise (elevate) the injured area above the level of your heart while you are sitting or lying down. Activity Do not use your knee to support your body weight until your health care provider says that you can. Use crutches or other devices as told by your health care provider. Do physical therapy exercises as told by your health care provider. Physical therapy will help you regain movement and strength in your knee. Follow instructions from your health care provider about: When you may start motion exercises. When you may start riding a stationary bike and doing other low-impact activities. When you may start to jog and do other high-impact activities. Do not lift anything that is heavier than 10 lb (4.5 kg), or the limit that you are told, until your health care provider says that it is safe. Ask your health care provider what activities are safe for you. General instructions Do not use any products that contain nicotine or tobacco, such as cigarettes, e-cigarettes, and chewing tobacco. These can delay healing. If you need help quitting, ask your health care provider. Wear compression stockings as told by your health care provider. These stockings help to  prevent blood clots and reduce swelling in your legs. Keep all follow-up visits. This is important. Contact a health care provider if: You have any of these signs of infection: Redness around an incision. Blood or more fluid coming from an incision. Warmth coming from an incision. Pus or a bad smell coming from an incision. More swelling or pain in your knee. A fever or chills. You have pain that does not get better with medicine. Your incision opens up. Get help right away if: You have trouble breathing. You have chest pain. You have increased pain or swelling in your calf or at the back of your knee. You have numbness and tingling near the knee joint or in the foot, ankle, or toes. You notice  that your foot or toes look darker than normal or are cooler than normal. These symptoms may represent a serious problem that is an emergency. Do not wait to see if the symptoms will go away. Get medical help right away. Call your local emergency services (911 in the [place] Do not drive yourself to the hospital. Summary After the procedure, it is common to have knee pain with bruising and swelling on your knee, calf, and ankle. Icing your knee and raising your leg above the level of your heart will help control the pain and swelling. Do physical therapy exercises as told by your health care provider. Physical therapy will help you regain movement and strength in your knee. This information is not intended to replace advice given to you by your health care provider. Make sure you discuss any questions you have with your health care provider. Document Revised: 03/10/2020 Document Reviewed: 03/10/2020 Elsevier Patient Education  [place] Anesthesia, Adult, Care After The following information offers guidance on how to care for yourself after your procedure. Your health care provider may also give you more specific instructions. If you have problems or questions, contact your health  care provider. What can I expect after the procedure? After the procedure, it is common for people to: Have pain or discomfort at the IV site. Have nausea or vomiting. Have a sore throat or hoarseness. Have trouble concentrating. Feel cold or chills. Feel weak, sleepy, or tired (fatigue). Have soreness and body aches. These can affect parts of the body that were not involved in surgery. Follow these instructions at home: For the time period you were told by your health care provider:  Rest. Do not participate in activities where you could fall or become injured. Do not drive or use machinery. Do not drink alcohol. Do not take sleeping pills or medicines that cause drowsiness. Do not make important decisions or sign legal documents. Do not take care of children on your own. General instructions Drink enough fluid to keep your urine pale yellow. If you have sleep apnea, surgery and certain medicines can increase your risk for breathing problems. Follow instructions from your health care provider about wearing your sleep device: Anytime you are sleeping, including during daytime naps. While taking prescription pain medicines, sleeping medicines, or medicines that make you drowsy. Return to your normal activities as told by your health care provider. Ask your health care provider what activities are safe for you. Take over-the-counter and prescription medicines only as told by your health care provider. Do not use any products that contain nicotine or tobacco. These products include cigarettes, chewing tobacco, and vaping devices, such as e-cigarettes. These can delay incision healing after surgery. If you need help quitting, ask your health care provider. Contact a health care provider if: You have nausea or vomiting that does not get better with medicine. You vomit every time you eat or drink. You have pain that does not get better with medicine. You cannot urinate or have bloody  urine. You develop a skin rash. You have a fever. Get help right away if: You have trouble breathing. You have chest pain. You vomit blood. These symptoms may be an emergency. Get help right away. Call 911. Do not wait to see if the symptoms will go away. Do not drive yourself to the hospital. Summary After the procedure, it is common to have a sore throat, hoarseness, nausea, vomiting, or to feel weak, sleepy, or fatigue. For the time period you  were told by your health care provider, do not drive or use machinery. Get help right away if you have difficulty breathing, have chest pain, or vomit blood. These symptoms may be an emergency. This information is not intended to replace advice given to you by your health care provider. Make sure you discuss any questions you have with your health care provider. Document Revised: 01/08/2022 Document Reviewed: 01/08/2022 Elsevier Patient Education  [place]. How to Use Chlorhexidine Before Surgery Chlorhexidine gluconate (CHG) is a germ-killing (antiseptic) solution that is used to clean the skin. It can get rid of the bacteria that normally live on the skin and can keep them away for about 24 hours. To clean your skin with CHG, you may be given: A CHG solution to use in the shower or as part of a sponge bath. A prepackaged cloth that contains CHG. Cleaning your skin with CHG may help lower the risk for infection: While you are staying in the intensive care unit of the hospital. If you have a vascular access, such as a central line, to provide short-term or long-term access to your veins. If you have a catheter to drain urine from your bladder. If you are on a ventilator. A ventilator is a machine that helps you breathe by moving air in and out of your lungs. After surgery. What are the risks? Risks of using CHG include: A skin reaction. Hearing loss, if CHG gets in your ears and you have a perforated eardrum. Eye injury, if CHG gets  in your eyes and is not rinsed out. The CHG product catching fire. Make sure that you avoid smoking and flames after applying CHG to your skin. Do not use CHG: If you have a chlorhexidine allergy or have previously reacted to chlorhexidine. On babies younger than 13 months of age. How to use CHG solution Use CHG only as told by your health care provider, and follow the instructions on the label. Use the full amount of CHG as directed. Usually, this is one bottle. During a shower Follow these steps when using CHG solution during a shower (unless your health care provider gives you different instructions): Start the shower. Use your normal soap and shampoo to wash your face and hair. Turn off the shower or move out of the shower stream. Pour the CHG onto a clean washcloth. Do not use any type of brush or rough-edged sponge. Starting at your neck, lather your body down to your toes. Make sure you follow these instructions: If you will be having surgery, pay special attention to the part of your body where you will be having surgery. Scrub this area for at least 1 minute. Do not use CHG on your head or face. If the solution gets into your ears or eyes, rinse them well with water. Avoid your genital area. Avoid any areas of skin that have broken skin, cuts, or scrapes. Scrub your back and under your arms. Make sure to wash skin folds. Let the lather sit on your skin for 1-2 minutes or as long as told by your health care provider. Thoroughly rinse your entire body in the shower. Make sure that all body creases and crevices are rinsed well. Dry off with a clean towel. Do not put any substances on your body afterward--such as powder, lotion, or perfume--unless you are told to do so by your health care provider. Only use lotions that are recommended by the manufacturer. Put on clean clothes or pajamas. If it is  the night before your surgery, sleep in clean sheets.  During a sponge bath Follow these  steps when using CHG solution during a sponge bath (unless your health care provider gives you different instructions): Use your normal soap and shampoo to wash your face and hair. Pour the CHG onto a clean washcloth. Starting at your neck, lather your body down to your toes. Make sure you follow these instructions: If you will be having surgery, pay special attention to the part of your body where you will be having surgery. Scrub this area for at least 1 minute. Do not use CHG on your head or face. If the solution gets into your ears or eyes, rinse them well with water. Avoid your genital area. Avoid any areas of skin that have broken skin, cuts, or scrapes. Scrub your back and under your arms. Make sure to wash skin folds. Let the lather sit on your skin for 1-2 minutes or as long as told by your health care provider. Using a different clean, wet washcloth, thoroughly rinse your entire body. Make sure that all body creases and crevices are rinsed well. Dry off with a clean towel. Do not put any substances on your body afterward--such as powder, lotion, or perfume--unless you are told to do so by your health care provider. Only use lotions that are recommended by the manufacturer. Put on clean clothes or pajamas. If it is the night before your surgery, sleep in clean sheets. How to use CHG prepackaged cloths Only use CHG cloths as told by your health care provider, and follow the instructions on the label. Use the CHG cloth on clean, dry skin. Do not use the CHG cloth on your head or face unless your health care provider tells you to. When washing with the CHG cloth: Avoid your genital area. Avoid any areas of skin that have broken skin, cuts, or scrapes. Before surgery Follow these steps when using a CHG cloth to clean before surgery (unless your health care provider gives you different instructions): Using the CHG cloth, vigorously scrub the part of your body where you will be having  surgery. Scrub using a back-and-forth motion for 3 minutes. The area on your body should be completely wet with CHG when you are done scrubbing. Do not rinse. Discard the cloth and let the area air-dry. Do not put any substances on the area afterward, such as powder, lotion, or perfume. Put on clean clothes or pajamas. If it is the night before your surgery, sleep in clean sheets.  For general bathing Follow these steps when using CHG cloths for general bathing (unless your health care provider gives you different instructions). Use a separate CHG cloth for each area of your body. Make sure you wash between any folds of skin and between your fingers and toes. Wash your body in the following order, switching to a new cloth after each step: The front of your neck, shoulders, and chest. Both of your arms, under your arms, and your hands. Your stomach and groin area, avoiding the genitals. Your right leg and foot. Your left leg and foot. The back of your neck, your back, and your buttocks. Do not rinse. Discard the cloth and let the area air-dry. Do not put any substances on your body afterward--such as powder, lotion, or perfume--unless you are told to do so by your health care provider. Only use lotions that are recommended by the manufacturer. Put on clean clothes or pajamas. Contact a health care provider  if: Your skin gets irritated after scrubbing. You have questions about using your solution or cloth. You swallow any chlorhexidine. Call your local poison control center (1-424-692-8181 in the [place] Get help right away if: Your eyes itch badly, or they become very red or swollen. Your skin itches badly and is red or swollen. Your hearing changes. You have trouble seeing. You have swelling or tingling in your mouth or throat. You have trouble breathing. These symptoms may represent a serious problem that is an emergency. Do not wait to see if the symptoms will go away. Get medical help  right away. Call your local emergency services (911 in the [place] Do not drive yourself to the hospital. Summary Chlorhexidine gluconate (CHG) is a germ-killing (antiseptic) solution that is used to clean the skin. Cleaning your skin with CHG may help to lower your risk for infection. You may be given CHG to use for bathing. It may be in a bottle or in a prepackaged cloth to use on your skin. Carefully follow your health care provider's instructions and the instructions on the product label. Do not use CHG if you have a chlorhexidine allergy. Contact your health care provider if your skin gets irritated after scrubbing. This information is not intended to replace advice given to you by your health care provider. Make sure you discuss any questions you have with your health care provider. Document Revised: 02/08/2022 Document Reviewed: 12/22/2020 Elsevier Patient Education  [place].

## 2022-10-26 ENCOUNTER — Telehealth: Payer: Self-pay | Admitting: Radiology

## 2022-10-26 ENCOUNTER — Encounter (HOSPITAL_COMMUNITY)
Admission: RE | Admit: 2022-10-26 | Discharge: 2022-10-26 | Disposition: A | Discharge status: Home / Self Care | Payer: Medicare Other | Source: Ambulatory Visit | Attending: Orthopedic Surgery | Admitting: Orthopedic Surgery

## 2022-10-26 ENCOUNTER — Other Ambulatory Visit (HOSPITAL_COMMUNITY)
Admission: RE | Admit: 2022-10-26 | Discharge: 2022-10-26 | Disposition: A | Discharge status: Home / Self Care | Payer: Medicare Other | Source: Ambulatory Visit | Attending: Orthopedic Surgery | Admitting: Orthopedic Surgery

## 2022-10-26 DIAGNOSIS — Z01818 Encounter for other preprocedural examination: Secondary | ICD-10-CM | POA: Insufficient documentation

## 2022-10-26 LAB — CBC WITH DIFFERENTIAL/PLATELET
Abs Immature Granulocytes: 0.01 10*3/uL (ref 0.00–0.07)
Basophils Absolute: 0 10*3/uL (ref 0.0–0.1)
Basophils Relative: 1 %
Eosinophils Absolute: 0.1 10*3/uL (ref 0.0–0.5)
Eosinophils Relative: 3 %
HCT: 35.7 % — ABNORMAL LOW (ref 36.0–46.0)
Hemoglobin: 10.9 g/dL — ABNORMAL LOW (ref 12.0–15.0)
Immature Granulocytes: 0 %
Lymphocytes Relative: 38 %
Lymphs Abs: 1.7 10*3/uL (ref 0.7–4.0)
MCH: 25.1 pg — ABNORMAL LOW (ref 26.0–34.0)
MCHC: 30.5 g/dL (ref 30.0–36.0)
MCV: 82.3 fL (ref 80.0–100.0)
Monocytes Absolute: 0.3 10*3/uL (ref 0.1–1.0)
Monocytes Relative: 7 %
Neutro Abs: 2.3 10*3/uL (ref 1.7–7.7)
Neutrophils Relative %: 51 %
Platelets: 230 10*3/uL (ref 150–400)
RBC: 4.34 MIL/uL (ref 3.87–5.11)
RDW: 16.9 % — ABNORMAL HIGH (ref 11.5–15.5)
WBC: 4.4 10*3/uL (ref 4.0–10.5)
nRBC: 0 % (ref 0.0–0.2)

## 2022-10-26 LAB — BASIC METABOLIC PANEL
Anion gap: 6 (ref 5–15)
BUN: 13 mg/dL (ref 8–23)
CO2: 26 mmol/L (ref 22–32)
Calcium: 8.7 mg/dL — ABNORMAL LOW (ref 8.9–10.3)
Chloride: 105 mmol/L (ref 98–111)
Creatinine, Ser: 0.59 mg/dL (ref 0.44–1.00)
GFR, Estimated: >60 mL/min (ref >60)
Glucose, Bld: 106 mg/dL — ABNORMAL HIGH (ref 70–99)
Potassium: 4.1 mmol/L (ref 3.5–5.1)
Sodium: 137 mmol/L (ref 135–145)

## 2022-10-26 NOTE — Pre-Procedure Instructions (Signed)
Kelly Fox messaged because patient did show for her preop today.

## 2022-10-26 NOTE — Telephone Encounter (Signed)
-----   Message from Encarnacion Chu, RN sent at 10/26/2022  2:52 PM EST ----- Regarding: no show Hey Creig Landin! Nile Riggs did not show for her pre-op today,

## 2022-10-27 ENCOUNTER — Telehealth: Payer: Self-pay | Admitting: Radiology

## 2022-10-27 ENCOUNTER — Other Ambulatory Visit: Payer: Self-pay

## 2022-10-27 ENCOUNTER — Encounter (HOSPITAL_COMMUNITY): Payer: Self-pay

## 2022-10-27 NOTE — Telephone Encounter (Signed)
I will call her to see.

## 2022-10-27 NOTE — Telephone Encounter (Signed)
I called her she had labs then was told to leave. I sent message to Kelly Fox to advise.

## 2022-10-27 NOTE — Telephone Encounter (Signed)
-----   Message from Encarnacion Chu, RN sent at 10/27/2022 10:42 AM EST ----- Regarding: surgery Good morning! Any word from Kelly Fox? What do we need to do with her? She is still on the surgery schedule for 10/29/2022 but did not come for a pre-op.

## 2022-10-28 NOTE — H&P (Signed)
Chief Complaint  Patient presents with   Knee Pain      LT knee/ s/p TKA 01/2022 Knee is not painful with walking/ knee is sore when she gets up from sitting/ sore in front of knee-feels funny but not numb    72 year old female is 8 months postop.  She noticed increasing pain and crepitance in the front of the left knee especially when getting out of a chair.  Denies any history of trauma fever or increased warmth to the joint       Past Medical History:  Diagnosis Date   Asthma      Past Surgical History:  Procedure Laterality Date   DENTAL SURGERY     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/02/2022   Procedure: TOTAL KNEE ARTHROPLASTY;  Surgeon: Carole Civil, MD;  Location: [facility];  Service: Orthopedics;  Laterality: Left;   Family History  Problem Relation Age of Onset   Breast cancer Sister    Social History   Socioeconomic History   Marital status: Married    Spouse name: Not on file   Number of children: Not on file   Years of education: Not on file   Highest education level: Not on file  Occupational History   Not on file  Tobacco Use   Smoking status: Never   Smokeless tobacco: Never  Vaping Use   Vaping Use: Never used  Substance and Sexual Activity   Alcohol use: Not Currently   Drug use: Not Currently   Sexual activity: Not Currently  Other Topics Concern   Not on file  Social History Narrative   Not on file   Social Determinants of Health   Financial Resource Strain: Not on file  Food Insecurity: Not on file  Transportation Needs: Not on file  Physical Activity: Not on file  Stress: Not on file  Social Connections: Not on file  Intimate Partner Violence: Not on file   Social History   Tobacco Use   Smoking status: Never   Smokeless tobacco: Never  Vaping Use   Vaping Use: Never used  Substance Use Topics   Alcohol use: Not Currently   Drug use: Not Currently   Current Outpatient Medications  Medication Instructions    Aspirin-Acetaminophen-Caffeine (GOODYS EXTRA STRENGTH) 500-325-65 MG PACK 1 packet, Oral, Daily PRN   Physical Exam Vitals and nursing note reviewed.  Constitutional:      Appearance: Normal appearance.  HENT:     Head: Normocephalic and atraumatic.  Eyes:     General: No scleral icterus.       Right eye: No discharge.        Left eye: No discharge.     Extraocular Movements: Extraocular movements intact.     Conjunctiva/sclera: Conjunctivae normal.     Pupils: Pupils are equal, round, and reactive to light.  Cardiovascular:     Rate and Rhythm: Normal rate.     Pulses: Normal pulses.  Skin:    General: Skin is warm and dry.     Capillary Refill: Capillary refill takes less than 2 seconds.  Neurological:     General: No focal deficit present.     Mental Status: She is alert and oriented to person, place, and time.  Psychiatric:        Mood and Affect: Mood normal.        Behavior: Behavior normal.        Thought Content: Thought content normal.        Judgment: Judgment  normal.     Knee Musculoskeletal Exam Gait   Gait is normal.   Inspection   Left     Erythema: none       Effusion: none       Edema: none       Ecchymosis: none       Deformity: none       Alignment: normal       Previous incision: anterior     Incision: well-healed   Palpation   Left     Increased warmth: none       Masses: none       Crepitus: patellofemoral       Tenderness: none     Range of Motion   Left     Left knee range of motion is normal.     Strength   Left     Left knee strength is normal.     Instability   Left     Varus stress grade: normal     Valgus stress grade: normal     Anterior drawer: normal     Posterior drawer: normal   Neurovascular   Left     Left knee neurovascular exam is normal.     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  It is causing her some difficulty getting out of a chair causing her to want to sit longer   Recommend arthroscopic removal of scar tissue from the patella OF THE LEFT KNEE    Patient agreeable to surgery and will be scheduled at her convenience postop 1 week after surgery

## 2022-10-29 ENCOUNTER — Ambulatory Visit (HOSPITAL_BASED_OUTPATIENT_CLINIC_OR_DEPARTMENT_OTHER): Payer: Medicare Other | Admitting: Anesthesiology

## 2022-10-29 ENCOUNTER — Ambulatory Visit (HOSPITAL_COMMUNITY)
Admission: RE | Admit: 2022-10-29 | Discharge: 2022-10-29 | Disposition: A | Discharge status: Home / Self Care | Payer: Medicare Other | Attending: Orthopedic Surgery | Admitting: Orthopedic Surgery

## 2022-10-29 ENCOUNTER — Ambulatory Visit (HOSPITAL_COMMUNITY): Payer: Medicare Other | Admitting: Anesthesiology

## 2022-10-29 ENCOUNTER — Encounter (HOSPITAL_COMMUNITY): Payer: Self-pay | Admitting: Orthopedic Surgery

## 2022-10-29 ENCOUNTER — Encounter (HOSPITAL_COMMUNITY)
Admission: RE | Disposition: A | Discharge status: Home / Self Care | Payer: Self-pay | Source: Home / Self Care | Attending: Orthopedic Surgery

## 2022-10-29 ENCOUNTER — Other Ambulatory Visit: Payer: Self-pay

## 2022-10-29 DIAGNOSIS — M25861 Other specified joint disorders, right knee: Secondary | ICD-10-CM

## 2022-10-29 DIAGNOSIS — M25862 Other specified joint disorders, left knee: Secondary | ICD-10-CM

## 2022-10-29 DIAGNOSIS — J45909 Unspecified asthma, uncomplicated: Secondary | ICD-10-CM | POA: Insufficient documentation

## 2022-10-29 DIAGNOSIS — Z01818 Encounter for other preprocedural examination: Secondary | ICD-10-CM

## 2022-10-29 DIAGNOSIS — Z96652 Presence of left artificial knee joint: Secondary | ICD-10-CM | POA: Insufficient documentation

## 2022-10-29 DIAGNOSIS — M1990 Unspecified osteoarthritis, unspecified site: Secondary | ICD-10-CM | POA: Insufficient documentation

## 2022-10-29 HISTORY — PX: KNEE ARTHROSCOPY: SHX127

## 2022-10-29 SURGERY — ARTHROSCOPY, KNEE
Anesthesia: General | Site: Knee | Laterality: Left

## 2022-10-29 MED ORDER — SODIUM CHLORIDE 0.9 % IR SOLN
Status: DC | PRN
  Administered 2022-10-29 (×3): 3000 mL

## 2022-10-29 MED ORDER — DEXAMETHASONE SODIUM PHOSPHATE 10 MG/ML IJ SOLN
INTRAMUSCULAR | Status: DC | PRN
  Administered 2022-10-29: 10 mg via INTRAVENOUS

## 2022-10-29 MED ORDER — ONDANSETRON HCL 4 MG/2ML IJ SOLN: 4.0000 mg | Freq: Once | INTRAMUSCULAR | Status: DC | PRN

## 2022-10-29 MED ORDER — DEXAMETHASONE SODIUM PHOSPHATE 4 MG/ML IJ SOLN
INTRAMUSCULAR | Status: AC
  Filled 2022-10-29: qty 1

## 2022-10-29 MED ORDER — ACETAMINOPHEN 500 MG PO TABS
500.0000 mg | ORAL_TABLET | Freq: Once | ORAL | Status: AC
  Administered 2022-10-29: 500 mg via ORAL
  Filled 2022-10-29: qty 1

## 2022-10-29 MED ORDER — BUPIVACAINE-EPINEPHRINE (PF) 0.5% -1:200000 IJ SOLN
INTRAMUSCULAR | Status: DC | PRN
  Administered 2022-10-29: 30 mL

## 2022-10-29 MED ORDER — BUPIVACAINE-EPINEPHRINE (PF) 0.5% -1:200000 IJ SOLN
INTRAMUSCULAR | Status: AC
  Filled 2022-10-29: qty 30

## 2022-10-29 MED ORDER — DEXAMETHASONE SODIUM PHOSPHATE 10 MG/ML IJ SOLN
10.0000 mg | Freq: Once | INTRAMUSCULAR | Status: DC
  Filled 2022-10-29: qty 1

## 2022-10-29 MED ORDER — HYDROCODONE-ACETAMINOPHEN 5-325 MG PO TABS: 1.0000 | ORAL_TABLET | ORAL | 0 refills | Status: DC | PRN

## 2022-10-29 MED ORDER — CHLORHEXIDINE GLUCONATE 0.12 % MT SOLN
15.0000 mL | Freq: Once | OROMUCOSAL | Status: AC
  Administered 2022-10-29: 15 mL via OROMUCOSAL
  Filled 2022-10-29: qty 15

## 2022-10-29 MED ORDER — FENTANYL CITRATE (PF) 100 MCG/2ML IJ SOLN
INTRAMUSCULAR | Status: DC | PRN
  Administered 2022-10-29 (×4): 25 ug via INTRAVENOUS

## 2022-10-29 MED ORDER — LIDOCAINE HCL (CARDIAC) PF 100 MG/5ML IV SOSY
PREFILLED_SYRINGE | INTRAVENOUS | Status: DC | PRN
  Administered 2022-10-29: 40 mg via INTRAVENOUS

## 2022-10-29 MED ORDER — EPINEPHRINE PF 1 MG/ML IJ SOLN
INTRAMUSCULAR | Status: AC
  Filled 2022-10-29: qty 6

## 2022-10-29 MED ORDER — HYDROMORPHONE HCL 1 MG/ML IJ SOLN: 0.2500 mg | INTRAMUSCULAR | Status: DC | PRN

## 2022-10-29 MED ORDER — LIDOCAINE HCL (PF) 2 % IJ SOLN
INTRAMUSCULAR | Status: AC
  Filled 2022-10-29: qty 5

## 2022-10-29 MED ORDER — ONDANSETRON HCL 4 MG/2ML IJ SOLN
INTRAMUSCULAR | Status: DC | PRN
  Administered 2022-10-29: 4 mg via INTRAVENOUS

## 2022-10-29 MED ORDER — EPHEDRINE 5 MG/ML INJ
INTRAVENOUS | Status: AC
  Filled 2022-10-29: qty 10

## 2022-10-29 MED ORDER — ONDANSETRON HCL 4 MG/2ML IJ SOLN
INTRAMUSCULAR | Status: AC
  Filled 2022-10-29: qty 2

## 2022-10-29 MED ORDER — PHENYLEPHRINE 80 MCG/ML (10ML) SYRINGE FOR IV PUSH (FOR BLOOD PRESSURE SUPPORT)
PREFILLED_SYRINGE | INTRAVENOUS | Status: AC
  Filled 2022-10-29: qty 20

## 2022-10-29 MED ORDER — ASPIRIN 81 MG PO TBEC: 81.0000 mg | DELAYED_RELEASE_TABLET | Freq: Every day | ORAL | 0 refills | Status: AC

## 2022-10-29 MED ORDER — PROPOFOL 10 MG/ML IV BOLUS
INTRAVENOUS | Status: DC | PRN
  Administered 2022-10-29: 150 mg via INTRAVENOUS

## 2022-10-29 MED ORDER — DEXAMETHASONE SODIUM PHOSPHATE 10 MG/ML IJ SOLN
INTRAMUSCULAR | Status: AC
  Filled 2022-10-29: qty 1

## 2022-10-29 MED ORDER — FENTANYL CITRATE (PF) 100 MCG/2ML IJ SOLN
INTRAMUSCULAR | Status: AC
  Filled 2022-10-29: qty 2

## 2022-10-29 MED ORDER — ORAL CARE MOUTH RINSE: 15.0000 mL | Freq: Once | OROMUCOSAL | Status: AC

## 2022-10-29 MED ORDER — ONDANSETRON HCL 4 MG/2ML IJ SOLN
4.0000 mg | Freq: Once | INTRAMUSCULAR | Status: AC
  Administered 2022-10-29: 4 mg via INTRAVENOUS
  Filled 2022-10-29: qty 2

## 2022-10-29 MED ORDER — CEFAZOLIN SODIUM-DEXTROSE 2-4 GM/100ML-% IV SOLN
2.0000 g | INTRAVENOUS | Status: AC
  Administered 2022-10-29: 2 g via INTRAVENOUS
  Filled 2022-10-29: qty 100

## 2022-10-29 MED ORDER — LACTATED RINGERS IV SOLN: INTRAVENOUS | Status: DC

## 2022-10-29 MED ORDER — PROPOFOL 10 MG/ML IV BOLUS
INTRAVENOUS | Status: AC
  Filled 2022-10-29: qty 20

## 2022-10-29 MED ORDER — HYDROCODONE-ACETAMINOPHEN 5-325 MG PO TABS
1.0000 | ORAL_TABLET | ORAL | Status: AC | PRN
  Administered 2022-10-29: 1 via ORAL
  Filled 2022-10-29: qty 1

## 2022-10-29 SURGICAL SUPPLY — 41 items
APL PRP STRL LF DISP 70% ISPRP (MISCELLANEOUS) ×1
BLADE EXCALIBUR 4.0X13 (MISCELLANEOUS) IMPLANT
BNDG CMPR STD VLCR NS LF 5.8X6 (GAUZE/BANDAGES/DRESSINGS) ×1
BNDG ELASTIC 6X5.8 VLCR NS LF (GAUZE/BANDAGES/DRESSINGS) ×1 IMPLANT
CHLORAPREP W/TINT 26 (MISCELLANEOUS) ×1 IMPLANT
CLOTH BEACON ORANGE TIMEOUT ST (SAFETY) ×1 IMPLANT
CUFF TOURN SGL QUICK 34 (TOURNIQUET CUFF) ×1
CUFF TRNQT CYL 34X4.125X (TOURNIQUET CUFF) ×2 IMPLANT
DECANTER SPIKE VIAL GLASS SM (MISCELLANEOUS) ×2 IMPLANT
GAUZE SPONGE 4X4 12PLY STRL (GAUZE/BANDAGES/DRESSINGS) IMPLANT
GAUZE XEROFORM 5X9 LF (GAUZE/BANDAGES/DRESSINGS) ×1 IMPLANT
GLOVE BIO SURGEON STRL SZ7 (GLOVE) IMPLANT
GLOVE BIOGEL PI IND STRL 7.0 (GLOVE) ×2 IMPLANT
GLOVE SS N UNI LF 8.5 STRL (GLOVE) ×1 IMPLANT
GLOVE SURG POLYISO LF SZ8 (GLOVE) ×1 IMPLANT
GOWN STRL REUS W/TWL LRG LVL3 (GOWN DISPOSABLE) ×1 IMPLANT
GOWN STRL REUS W/TWL XL LVL3 (GOWN DISPOSABLE) ×1 IMPLANT
HOLDER KNEE FOAM BLUE (MISCELLANEOUS) ×1 IMPLANT
IV NS IRRIG 3000ML ARTHROMATIC (IV SOLUTION) ×2 IMPLANT
KIT BLADEGUARD II DBL (SET/KITS/TRAYS/PACK) ×1 IMPLANT
KIT TURNOVER CYSTO (KITS) ×1 IMPLANT
MANIFOLD NEPTUNE II (INSTRUMENTS) ×1 IMPLANT
MARKER SKIN DUAL TIP RULER LAB (MISCELLANEOUS) ×1 IMPLANT
NDL HYPO 18GX1.5 BLUNT FILL (NEEDLE) ×1 IMPLANT
NDL HYPO 21X1.5 SAFETY (NEEDLE) ×1 IMPLANT
NEEDLE HYPO 18GX1.5 BLUNT FILL (NEEDLE) ×1 IMPLANT
NEEDLE HYPO 21X1.5 SAFETY (NEEDLE) ×1 IMPLANT
PACK ARTHRO LIMB DRAPE STRL (MISCELLANEOUS) ×1 IMPLANT
PAD ABD 5X9 TENDERSORB (GAUZE/BANDAGES/DRESSINGS) ×1 IMPLANT
PAD ARMBOARD 7.5X6 YLW CONV (MISCELLANEOUS) ×1 IMPLANT
PAD COLD SHLDR SM WRAP-ON (PAD) IMPLANT
PADDING CAST COTTON 6X4 STRL (CAST SUPPLIES) ×1 IMPLANT
PORT APPOLLO RF 90DEGREE MULTI (SURGICAL WAND) IMPLANT
SET ARTHROSCOPY INST (INSTRUMENTS) ×1 IMPLANT
SET BASIN LINEN APH (SET/KITS/TRAYS/PACK) ×1 IMPLANT
SUT ETHILON 3 0 FSL (SUTURE) ×1 IMPLANT
SYR 10ML LL (SYRINGE) ×1 IMPLANT
SYR 30ML LL (SYRINGE) ×1 IMPLANT
SYR CONTROL 10ML LL (SYRINGE) ×1 IMPLANT
TUBE CONNECTING 12X1/4 (SUCTIONS) ×4 IMPLANT
TUBING IN/OUT FLOW W/MAIN PUMP (TUBING) ×1 IMPLANT

## 2022-10-29 NOTE — Anesthesia Preprocedure Evaluation (Signed)
Anesthesia Evaluation  Patient identified by MRN, date of birth, ID band Patient awake    Reviewed: Allergy & Precautions, NPO status , Patient's Chart, lab work & pertinent test results  History of Anesthesia Complications Negative for: history of anesthetic complications  Airway Mallampati: II  TM Distance: >3 FB Neck ROM: Full    Dental  (+) Upper Dentures, Edentulous Lower   Pulmonary asthma    Pulmonary exam normal breath sounds clear to auscultation       Cardiovascular negative cardio ROS Normal cardiovascular exam Rhythm:Regular Rate:Normal     Neuro/Psych negative neurological ROS  negative psych ROS   GI/Hepatic negative GI ROS, Neg liver ROS,,,  Endo/Other  negative endocrine ROS    Renal/GU negative Renal ROS  negative genitourinary   Musculoskeletal  (+) Arthritis , Osteoarthritis,    Abdominal   Peds negative pediatric ROS (+)  Hematology negative hematology ROS (+)   Anesthesia Other Findings   Reproductive/Obstetrics negative OB ROS                             Anesthesia Physical Anesthesia Plan  ASA: 2  Anesthesia Plan: General   Post-op Pain Management: Dilaudid IV   Induction: Intravenous  PONV Risk Score and Plan: 3 and Ondansetron and Dexamethasone  Airway Management Planned: LMA  Additional Equipment:   Intra-op Plan:   Post-operative Plan: Extubation in OR  Informed Consent: I have reviewed the patients History and Physical, chart, labs and discussed the procedure including the risks, benefits and alternatives for the proposed anesthesia with the patient or authorized representative who has indicated his/her understanding and acceptance.     Dental advisory given  Plan Discussed with: CRNA and Surgeon  Anesthesia Plan Comments:         Anesthesia Quick Evaluation

## 2022-10-29 NOTE — Anesthesia Procedure Notes (Signed)
Procedure Name: LMA Insertion Date/Time: 10/29/2022 7:25 AM  Performed by: Karna Dupes, CRNAPre-anesthesia Checklist: Emergency Drugs available, Patient identified, Suction available and Patient being monitored Patient Re-evaluated:Patient Re-evaluated prior to induction Oxygen Delivery Method: Circle system utilized Preoxygenation: Pre-oxygenation with 100% oxygen Induction Type: IV induction LMA: LMA inserted LMA Size: 4.0 Number of attempts: 1 Placement Confirmation: positive ETCO2 and breath sounds checked- equal and bilateral Tube secured with: Tape Dental Injury: Teeth and Oropharynx as per pre-operative assessment

## 2022-10-29 NOTE — Op Note (Addendum)
10/29/2022  8:16 AM  PATIENT:  Kelly Fox  72 y.o. female  PRE-OPERATIVE DIAGNOSIS:  patellar clunk syndrome left knee  POST-OPERATIVE DIAGNOSIS:  patellar clunk syndrome left knee  FINDINGS: Synovial Scar tissue was found in the typical place at the superior aspect of the patellar button at the quadriceps tendon insertion with some other fibrinous scar tissue in the patellofemoral region as well as the notch  No other signs of pathology  PROCEDURE:  Procedure(s): ARTHROSCOPY KNEE LEFT SYNOVECTOMY 1 COMPARTMENT FOR SCAR TISSUE DEBRIDEMENT (Left) [phone]  The patient was seen in the preop area and reevaluated.  She was cleared for surgery.  The surgical site was confirmed as left knee and marked.  The patient taken to the operating for general anesthesia in supine position.  We did apply a tourniquet but it was not inflated.  We placed a leg holder over the left leg and padded the right leg into flexion  Preop examination showed about a 2 to 3 degree flexion contracture and 100 degrees of flexion in the passive flex position  There is no instability medial lateral or anterior posterior  After sterile prep and drape and timeout the antibiotics were confirmed 2 g of Ancef, a spinal needle was used to assess the lateral portal and the scope was placed through a lateral incision and into the suprapatellar pouch.  A diagnostic arthroscopy was performed  The scar tissue at the superior portion of the patellar button at the junction of the quadriceps tendon was noted as well as a band of scar tissue tracking from the patella down the lateral portion of the joint there was also some scar tissue in the notch at the post area  A superomedial portal was established with a spinal needle and a shaver was used to debride the scar tissue.  90 degree wand was used as well to debride and coagulate the tissue.  A second portal was made medially in the standard position to address the scar tissue in the notch  which was removed with the shaver and the 90 degree wand   After thorough removal of all the tissue the joint was irrigated and closed with 3-0 nylon sutures  SURGEON:  Surgeon(s) and Role:    * Carole Civil, MD - Primary  PHYSICIAN ASSISTANT:   ASSISTANTS: none   ANESTHESIA:   general  EBL:  5 mL   BLOOD ADMINISTERED:none  DRAINS: none   LOCAL MEDICATIONS USED:  MARCAINE     SPECIMEN:  No Specimen  DISPOSITION OF SPECIMEN:  N/A  COUNTS:  YES  TOURNIQUET:  * Missing tourniquet times found for documented tourniquets in log: [id number] *  DICTATION: .Dragon Dictation  PLAN OF CARE: Discharge to home after PACU  PATIENT DISPOSITION:  PACU - hemodynamically stable.   Delay start of Pharmacological VTE agent (>24hrs) due to surgical blood loss or risk of bleeding: not applicable  Plan  full weightbearing as tolerated Active range of motion immediately Return in 1 week 2 mg of dexamethasone given IV in the PACU to prevent and inhibit scar tissue from reforming

## 2022-10-29 NOTE — Anesthesia Postprocedure Evaluation (Signed)
Anesthesia Post Note  Patient: Kelly Fox  Procedure(s) Performed: ARTHROSCOPY KNEE LEFT FOR SCAR TISSUE DEBRIDEMENT (Left: Knee)  Patient location during evaluation: Phase II Anesthesia Type: General Level of consciousness: awake and alert and oriented Pain management: pain level controlled Vital Signs Assessment: post-procedure vital signs reviewed and stable Respiratory status: spontaneous breathing, nonlabored ventilation and respiratory function stable Cardiovascular status: blood pressure returned to baseline and stable Postop Assessment: no apparent nausea or vomiting Anesthetic complications: no  No notable events documented.   Last Vitals:  Vitals:   10/29/22 0845 10/29/22 0906  BP: 131/84 (!) 144/82  Pulse: 78 78  Resp: 13 18  Temp:  36.7 C  SpO2: 99% 100%    Last Pain:  Vitals:   10/29/22 0906  TempSrc: Oral  PainSc: 3                  Scheryl Sanborn C Neel Buffone

## 2022-10-29 NOTE — Interval H&P Note (Signed)
History and Physical Interval Note:  10/29/2022 7:12 AM  Kelly Fox  has presented today for surgery, with the diagnosis of patellar clunk syndrome left knee.  The various methods of treatment have been discussed with the patient and family. After consideration of risks, benefits and other options for treatment, the patient has consented to  Procedure(s): ARTHROSCOPY KNEE LEFT FOR SCAR TISSUE DEBRIDEMENT (Left) as a surgical intervention.  The patient's history has been reviewed, patient examined, no change in status, stable for surgery.  I have reviewed the patient's chart and labs.  Questions were answered to the patient's satisfaction.     [name]

## 2022-10-29 NOTE — Brief Op Note (Addendum)
10/29/2022  8:16 AM  PATIENT:  Kelly Fox  72 y.o. female  PRE-OPERATIVE DIAGNOSIS:  patellar clunk syndrome left knee  POST-OPERATIVE DIAGNOSIS:  patellar clunk syndrome left knee  FINDINGS: Scar tissue was found in the typical place at the superior aspect of the patellar button at the quadriceps tendon insertion with some other fibrinous scar tissue in the patellofemoral region as well as the notch  No other signs of pathology  PROCEDURE:  Procedure(s): ARTHROSCOPY KNEE LEFT FOR SCAR TISSUE DEBRIDEMENT (Left)  SURGEON:  Surgeon(s) and Role:    Carole Civil, MD - Primary  PHYSICIAN ASSISTANT:   ASSISTANTS: none   ANESTHESIA:   general  EBL:  5 mL   BLOOD ADMINISTERED:none  DRAINS: none   LOCAL MEDICATIONS USED:  MARCAINE     SPECIMEN:  No Specimen  DISPOSITION OF SPECIMEN:  N/A  COUNTS:  YES  TOURNIQUET:  * Missing tourniquet times found for documented tourniquets in log: [id number] *  DICTATION: .Dragon Dictation  PLAN OF CARE: Discharge to home after PACU  PATIENT DISPOSITION:  PACU - hemodynamically stable.   Delay start of Pharmacological VTE agent (>24hrs) due to surgical blood loss or risk of bleeding: not applicable

## 2022-10-29 NOTE — Transfer of Care (Signed)
Immediate Anesthesia Transfer of Care Note  Patient: Kelly Fox  Procedure(s) Performed: ARTHROSCOPY KNEE LEFT FOR SCAR TISSUE DEBRIDEMENT (Left: Knee)  Patient Location: PACU  Anesthesia Type:General  Level of Consciousness: awake  Airway & Oxygen Therapy: Patient Spontanous Breathing and Patient connected to nasal cannula oxygen  Post-op Assessment: Report given to RN and Post -op Vital signs reviewed and stable  Post vital signs: Reviewed and stable  Last Vitals:  Vitals Value Taken Time  BP 112/54 10/29/22 0819  Temp 97.6   Pulse 76 10/29/22 0820  Resp 12 10/29/22 0820  SpO2 96 % 10/29/22 0820  Vitals shown include unvalidated device data.  Last Pain:  Vitals:   10/29/22 0703  PainSc: 0-No pain         Complications: No notable events documented.

## 2022-11-03 ENCOUNTER — Encounter: Payer: Self-pay | Admitting: Orthopedic Surgery

## 2022-11-03 ENCOUNTER — Ambulatory Visit (INDEPENDENT_AMBULATORY_CARE_PROVIDER_SITE_OTHER): Payer: Medicare Other | Admitting: Orthopedic Surgery

## 2022-11-03 DIAGNOSIS — Z4889 Encounter for other specified surgical aftercare: Secondary | ICD-10-CM

## 2022-11-03 DIAGNOSIS — M25862 Other specified joint disorders, left knee: Secondary | ICD-10-CM

## 2022-11-03 NOTE — Progress Notes (Signed)
Chief Complaint  Patient presents with   Post-op Follow-up    Left Knee scope for patellar clunk 10/29/21 improving    Kelly Fox had patellar clunk syndrome surgery with arthroscopic debridement  We remove the sutures from the portals the wound looks good.  She has good flexion of her knee.  We advised her to do some quadricep strengthening and continue icing the knee and follow-up in 4 weeks.  Images were reviewed with the patient to show the pathology

## 2022-11-03 NOTE — Patient Instructions (Signed)
Continue to ice the knee 3 x a day for the next 4 weeks   Do the exercises as instructed   Increase activity slowly

## 2022-11-04 ENCOUNTER — Encounter (HOSPITAL_COMMUNITY): Payer: Self-pay | Admitting: Orthopedic Surgery

## 2022-12-01 ENCOUNTER — Encounter: Payer: Self-pay | Admitting: Orthopedic Surgery

## 2022-12-01 ENCOUNTER — Ambulatory Visit (INDEPENDENT_AMBULATORY_CARE_PROVIDER_SITE_OTHER): Payer: Medicare Other | Admitting: Orthopedic Surgery

## 2022-12-01 DIAGNOSIS — Z9889 Other specified postprocedural states: Secondary | ICD-10-CM

## 2022-12-01 DIAGNOSIS — M25862 Other specified joint disorders, left knee: Secondary | ICD-10-CM

## 2022-12-01 DIAGNOSIS — Z4889 Encounter for other specified surgical aftercare: Secondary | ICD-10-CM

## 2022-12-01 DIAGNOSIS — Z96652 Presence of left artificial knee joint: Secondary | ICD-10-CM

## 2022-12-01 NOTE — Progress Notes (Signed)
Chief Complaint  Patient presents with   Post-op Follow-up    Left knee replaced 02/02/22 scope for patellar clunk 10/29/22 improving     Postop visit #2 status post arthroscopy for patellar clunk syndrome  Encounter Diagnoses  Name Primary?   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

## 2023-01-28 DIAGNOSIS — Z96652 Presence of left artificial knee joint: Secondary | ICD-10-CM | POA: Insufficient documentation

## 2023-02-03 ENCOUNTER — Ambulatory Visit: Payer: Medicare Other | Admitting: Orthopedic Surgery

## 2023-02-03 DIAGNOSIS — M25862 Other specified joint disorders, left knee: Secondary | ICD-10-CM

## 2023-02-03 DIAGNOSIS — Z96652 Presence of left artificial knee joint: Secondary | ICD-10-CM

## 2023-03-28 ENCOUNTER — Ambulatory Visit: Payer: Medicare Other | Admitting: Orthopedic Surgery

## 2023-03-28 ENCOUNTER — Encounter: Payer: Self-pay | Admitting: Orthopedic Surgery

## 2023-03-28 ENCOUNTER — Other Ambulatory Visit (INDEPENDENT_AMBULATORY_CARE_PROVIDER_SITE_OTHER): Payer: Medicare Other

## 2023-03-28 VITALS — BP 152/86 | HR 80 | Ht 62.0 in | Wt 186.0 lb

## 2023-03-28 DIAGNOSIS — M1711 Unilateral primary osteoarthritis, right knee: Secondary | ICD-10-CM | POA: Diagnosis not present

## 2023-03-28 DIAGNOSIS — G8929 Other chronic pain: Secondary | ICD-10-CM

## 2023-03-28 DIAGNOSIS — Z01818 Encounter for other preprocedural examination: Secondary | ICD-10-CM | POA: Diagnosis not present

## 2023-03-28 MED ORDER — BUPIVACAINE-MELOXICAM ER 400-12 MG/14ML IJ SOLN: 400.0000 mg | Freq: Once | INTRAMUSCULAR | Status: DC

## 2023-03-28 NOTE — Progress Notes (Unsigned)
Chief Complaint  Patient presents with   Knee Pain    Pain in right knee would like to discuss surgery    72 year old female had a successful left total knee within the last 3 years.  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, 2022 01/28/2023   Patellar clunk syndrome of left knee 10/29/2022   Osteoarthritis of left knee 02/02/2022   Unilateral primary osteoarthritis, left knee    Elevated blood-pressure reading without diagnosis of hypertension 12/03/2021   Seasonal allergic rhinitis 12/03/2021   Past Medical History:  Diagnosis Date   Asthma    Past Surgical History:  Procedure Laterality Date   DENTAL SURGERY     KNEE ARTHROSCOPY Left 10/29/2022   Procedure: ARTHROSCOPY KNEE LEFT FOR SCAR TISSUE DEBRIDEMENT;  Surgeon: Vickki Hearing, MD;  Location: [facility];  Service: Orthopedics;  Laterality: Left;   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/02/2022   Procedure: TOTAL KNEE ARTHROPLASTY;  Surgeon: Vickki Hearing, MD;  Location: [facility];  Service: Orthopedics;  Laterality: Left;   Social History   Tobacco Use   Smoking status: Never   Smokeless tobacco: Never  Vaping Use   Vaping Use: Never used  Substance Use Topics   Alcohol use: Not Currently   Drug use: Not Currently     BP (!) 152/86   Pulse 80   Ht 5\' 2"  (1.575 m)   Wt 186 lb (84.4 kg)   BMI 34.02 kg/m   Physical Exam Vitals and nursing note reviewed.  Constitutional:      Appearance: Normal appearance.  HENT:     Head: Normocephalic  and atraumatic.  Eyes:     General: No scleral icterus.       Right eye: No discharge.        Left eye: No discharge.     Extraocular Movements: Extraocular movements intact.     Conjunctiva/sclera: Conjunctivae normal.     Pupils: Pupils are equal, round, and reactive to light.  Cardiovascular:     Rate and Rhythm: Normal rate.     Pulses: Normal pulses.  Skin:    General: Skin is warm and dry.     Capillary Refill: Capillary refill takes less than 2 seconds.  Neurological:     General: No focal deficit present.     Mental Status: She is alert and oriented to person, place, and time.  Psychiatric:        Mood and Affect: Mood normal.        Behavior: Behavior normal.        Thought Content: Thought content normal.        Judgment: Judgment normal.    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 Alternative treatment has also been reviewed, questions were encouraged and answered. The postoperative plan is also been reviewed.  Right total knee arthroplasty

## 2023-03-28 NOTE — Patient Instructions (Addendum)
Your surgery will be at [facility] by Dr Gudelia Eugene  plan to be in hospital overnight. The hospital will contact you with a preoperative appointment to discuss Anesthesia.  Please arrive on time or 15 minutes early for the preoperative appointment, they have a very tight schedule if you are late or do not come in your surgery will be cancelled.  The phone number for the preop area is [phone]. Please bring your medications with you for the appointment. They will tell you the arrival time for surgery and medication instructions when you have your preoperative evaluation. Do not wear nail polish the day of your surgery and if you take Phentermine you need to stop this medication ONE WEEK prior to your surgery. f you take Invokana, Farxiga, Jardiance, or Steglatro) - Hold 72 hours before the procedure.  If you take Ozempic,  Bydureon or Trulicity do not take for 8 days before your surgery. If you take Victoza, Rybelsis, Saxenda or Adlyxi stop 24 hours before the procedure. Please arrive at the hospital 2 hours before procedure if scheduled at 9:30 or later in the day or at the time the nurse tells you at your preoperative visit.   If you have my chart do not use the time given in my chart use the time given to you by the nurse during your preoperative visit.   Your surgery  time may change. Please be available for phone calls the day of your surgery and the day before. The Short Stay department may need to discuss changes about your surgery time. Not reaching the you could lead to procedure delays and possible cancellation.  You must have a ride home and someone to stay with you for 24 to 48 hours. The person taking you home will receive and sign for the your discharge instructions.  Please be prepared to give your support person's name and telephone number to [facility] [employer]. Dr Shiquan Mathieu will need that name and phone number post procedure.   You will also get a call from a representative of [employer], they have a machine that you will use in the first few weeks after surgery. It is called a CPM.   You will have home physical therapy for 2 weeks after surgery, the home health agency will call you before or just following the surgery to set up visits. [facility] is the agency we normally use, unless you request another agency.   You will get a call also from outpatient therapy for therapy starting when the home therapy is done.  If you have questions or need to Reschedule the surgery, call the office ask for Amy.    You have decided to proceed with knee replacement surgery. You have decided not to continue with nonoperative measures such as but not limited to oral medication, weight loss, activity modification, physical therapy, bracing, or injection.  We will perform the procedure commonly known as total knee replacement. Some of the risks associated with knee replacement surgery include but are not limited to Bleeding Infection Swelling Stiffness Blood clot Pulmonary embolism  Loosening of the implant Pain that persists even after surgery  Infection is especially devastating complication of knee surgery although rare. If infection does occur your implant will usually have to be removed and several surgeries and antibiotics will be needed to eradicate the infection prior to performing a repeat replacement.   In some cases amputation is required to eradicate the infection. In other rare cases a knee fusion is   needed   In compliance with recent [place] law in federal regulation regarding opioid use and abuse and addiction, we will taper (stop) opioid medication after 2 weeks.  If you're not comfortable with these risks and would like to continue with nonoperative treatment please let Dr. Sussan Meter know prior to your surgery.  

## 2023-04-07 ENCOUNTER — Other Ambulatory Visit: Payer: Self-pay | Admitting: Orthopedic Surgery

## 2023-04-07 DIAGNOSIS — M1711 Unilateral primary osteoarthritis, right knee: Secondary | ICD-10-CM

## 2023-04-12 ENCOUNTER — Encounter (HOSPITAL_COMMUNITY): Payer: Medicare Other

## 2023-04-12 NOTE — Addendum Note (Signed)
Addended byCaffie Damme on: 04/12/2023 03:06 PM   Modules accepted: Orders

## 2023-04-12 NOTE — Patient Instructions (Signed)
Kelly Fox  04/12/2023     @PREFPERIOPPHARMACY @   Your procedure is scheduled on  04/19/2023.   Report to [facility] at  0600 A.M.   Call this number if you have problems the morning of surgery:  [phone]  If you experience any cold or flu symptoms such as cough, fever, chills, shortness of breath, etc. between now and your scheduled surgery, please notify us at the above number.   Remember:  Do not eat or drink after midnight.      Use your inhaler before you come and bring your rescue inhaler with you.      Take these medicines the morning of surgery with A SIP OF WATER                                                             None.      Do not wear jewelry, make-up or nail polish, including gel polish,  artificial nails, or any other type of covering on natural nails (fingers and  toes).  Do not wear lotions, powders, or perfumes, or deodorant.  Do not shave 48 hours prior to surgery.  Men may shave face and neck.  Do not bring valuables to the hospital.  [facility] is not responsible for any belongings or valuables.  Contacts, dentures or bridgework may not be worn into surgery.  Leave your suitcase in the car.  After surgery it may be brought to your room.  For patients admitted to the hospital, discharge time will be determined by your treatment team.  Patients discharged the day of surgery will not be allowed to drive home.    Special instructions:   DO NOT smoke tobacco or vape for 24 hours before your procedure.  Please read over the following fact sheets that you were given. Pain Booklet, Coughing and Deep Breathing, Blood Transfusion Information, Lab Information, Total Joint Packet, MRSA Information, Surgical Site Infection Prevention, Anesthesia Post-op Instructions, and Care and Recovery After Surgery      Total Knee Replacement, Care After This sheet gives you information about how to care for yourself after your procedure. Your  health care provider may also give you more specific instructions. If you have problems or questions, contact your health care provider. What can I expect after the procedure? After the procedure, it is common to have: Redness, pain, and swelling at the incision area. Stiffness. Discomfort. A small amount of blood or clear fluid coming from your incision. Follow these instructions at home: Medicines Take over-the-counter and prescription medicines only as told by your health care provider. If you were prescribed a blood thinner (anticoagulant), take it as told by your health care provider. Ask your health care provider if the medicine prescribed to you: Requires you to avoid driving or using machinery. Can cause constipation. You may need to take these actions to prevent or treat constipation: Drink enough fluid to keep your urine pale yellow. Take over-the-counter or prescription medicines. Eat foods that are high in fiber, such as beans, whole grains, and fresh fruits and vegetables. Limit foods that are high in fat and processed sugars, such as fried or sweet foods. Incision care  Follow instructions from your health care provider about how to take care  of your incision. Make sure you: Wash your hands with soap and water for at least 20 seconds before and after you change your bandage (dressing). If soap and water are not available, use hand sanitizer. Change your dressing as told by your health care provider. Leave stitches (sutures), staples, skin glue, or adhesive strips in place. These skin closures may need to stay in place for 2 weeks or longer. If adhesive strip edges start to loosen and curl up, you may trim the loose edges. Do not remove adhesive strips completely unless your health care provider tells you to do that. Do not take baths, swim, or use a hot tub until your health care provider approves. Check your incision area every day for signs of infection. Check for: More  redness, swelling, or pain. More fluid or blood. Warmth. Pus or a bad smell. Activity Rest as told by your health care provider. Avoid sitting for a long time without moving. Get up to take short walks every 1-2 hours. This is important to improve blood flow and breathing. Ask for help if you feel weak or unsteady. Follow instructions from your health care provider about using a walker, crutches, or a cane. You may use your legs to support (bear) your body weight as told by your health care provider. Follow instructions about how much weight you may safely support on your affected leg (weight-bearing restrictions). A physical therapist may show you how to get out of a bed and chair and how to go up and down stairs. You will first do this with a walker, crutches, or a cane and then without any of these devices. Once you are able to walk without a limp, you may stop using a walker, crutches, or a cane. Do exercises as told by your health care provider or physical therapist. Avoid high-impact activities, including running, jumping rope, and doing jumping jacks. Do not play contact sports until your health care provider approves. Return to your normal activities as told by your health care provider. Ask your health care provider what activities are safe for you. Managing pain, stiffness, and swelling  If directed, put ice on your knee. To do this: Put ice in a plastic bag or use the icing device (cold flow pad) that you were given. Follow instructions from your health care provider about how to use the icing device. Place a towel between your skin and the bag or between your skin and the icing device. Leave the ice on for 20 minutes, 2-3 times a day. Remove the ice if your skin turns bright red. This is very important. If you cannot feel pain, heat, or cold, you have a greater risk of damage to the area. Move your toes often to reduce stiffness and swelling. Raise (elevate) your leg above the  level of your heart while you are sitting or lying down. Use several pillows to keep your leg straight. Do not put a pillow just under the knee. If the knee is bent for a long time, this may lead to stiffness. Wear elastic knee support as told by your health care provider. Safety  To help prevent falls, keep floors clear of objects you may trip over. Place items that you may need within easy reach. Wear an apron or tool belt with pockets for carrying objects. This leaves your hands free to help with your balance. Ask your health care provider when it is safe to drive. General instructions Wear compression stockings as told by your health  care provider. These stockings help to prevent blood clots and reduce swelling in your legs. Continue with breathing exercises. This helps prevent lung infection. Do not use any products that contain nicotine or tobacco. These products include cigarettes, chewing tobacco, and vaping devices, such as e-cigarettes. These can delay healing after surgery. If you need help quitting, ask your health care provider. Tell your health care provider if you plan to have dental work. Also: Tell your dentist about your joint replacement. Ask your health care provider if there are any special instructions you need to follow before having dental care and routine cleanings. Keep all follow-up visits. This is important. Contact a health care provider if: You have a fever or chills. You have a cough or feel short of breath. Your medicine is not controlling your pain. You have any of these signs of infection: More redness, swelling, or pain around your incision. More fluid or blood coming from your incision. Warmth coming from your incision. Pus or a bad smell coming from your incision. You fall. Get help right away if: You have severe pain. You have trouble breathing. You have chest pain. You have redness, swelling, pain, or warmth in your calf or leg. Your incision  breaks open after sutures or staples are removed. These symptoms may represent a serious problem that is an emergency. Do not wait to see if the symptoms will go away. Get medical help right away. Call your local emergency services (911 in the [place] Do not drive yourself to the hospital. Summary After the procedure, it is common to have pain and swelling at the incision area, a small amount of blood or fluid coming from your incision, and stiffness. Follow instructions from your health care provider about how to take care of your incision. Use crutches, a walker, or a cane as told by your health care provider. This information is not intended to replace advice given to you by your health care provider. Make sure you discuss any questions you have with your health care provider. Document Revised: 04/01/2020 Document Reviewed: 04/01/2020 Elsevier Patient Education  2024 [employer] General Anesthesia, Adult, Care After The following information offers guidance on how to care for yourself after your procedure. Your health care provider may also give you more specific instructions. If you have problems or questions, contact your health care provider. What can I expect after the procedure? After the procedure, it is common for people to: Have pain or discomfort at the IV site. Have nausea or vomiting. Have a sore throat or hoarseness. Have trouble concentrating. Feel cold or chills. Feel weak, sleepy, or tired (fatigue). Have soreness and body aches. These can affect parts of the body that were not involved in surgery. Follow these instructions at home: For the time period you were told by your health care provider:  Rest. Do not participate in activities where you could fall or become injured. Do not drive or use machinery. Do not drink alcohol. Do not take sleeping pills or medicines that cause drowsiness. Do not make important decisions or sign legal documents. Do not take care of  children on your own. General instructions Drink enough fluid to keep your urine pale yellow. If you have sleep apnea, surgery and certain medicines can increase your risk for breathing problems. Follow instructions from your health care provider about wearing your sleep device: Anytime you are sleeping, including during daytime naps. While taking prescription pain medicines, sleeping medicines, or medicines that make you drowsy. Return to  your normal activities as told by your health care provider. Ask your health care provider what activities are safe for you. Take over-the-counter and prescription medicines only as told by your health care provider. Do not use any products that contain nicotine or tobacco. These products include cigarettes, chewing tobacco, and vaping devices, such as e-cigarettes. These can delay incision healing after surgery. If you need help quitting, ask your health care provider. Contact a health care provider if: You have nausea or vomiting that does not get better with medicine. You vomit every time you eat or drink. You have pain that does not get better with medicine. You cannot urinate or have bloody urine. You develop a skin rash. You have a fever. Get help right away if: You have trouble breathing. You have chest pain. You vomit blood. These symptoms may be an emergency. Get help right away. Call 911. Do not wait to see if the symptoms will go away. Do not drive yourself to the hospital. Summary After the procedure, it is common to have a sore throat, hoarseness, nausea, vomiting, or to feel weak, sleepy, or fatigue. For the time period you were told by your health care provider, do not drive or use machinery. Get help right away if you have difficulty breathing, have chest pain, or vomit blood. These symptoms may be an emergency. This information is not intended to replace advice given to you by your health care provider. Make sure you discuss any  questions you have with your health care provider. Document Revised: 01/08/2022 Document Reviewed: 01/08/2022 Elsevier Patient Education  2024 [employer] How to Use Chlorhexidine Before Surgery Chlorhexidine gluconate (CHG) is a germ-killing (antiseptic) solution that is used to clean the skin. It can get rid of the bacteria that normally live on the skin and can keep them away for about 24 hours. To clean your skin with CHG, you may be given: A CHG solution to use in the shower or as part of a sponge bath. A prepackaged cloth that contains CHG. Cleaning your skin with CHG may help lower the risk for infection: While you are staying in the intensive care unit of the hospital. If you have a vascular access, such as a central line, to provide short-term or long-term access to your veins. If you have a catheter to drain urine from your bladder. If you are on a ventilator. A ventilator is a machine that helps you breathe by moving air in and out of your lungs. After surgery. What are the risks? Risks of using CHG include: A skin reaction. Hearing loss, if CHG gets in your ears and you have a perforated eardrum. Eye injury, if CHG gets in your eyes and is not rinsed out. The CHG product catching fire. Make sure that you avoid smoking and flames after applying CHG to your skin. Do not use CHG: If you have a chlorhexidine allergy or have previously reacted to chlorhexidine. On babies younger than 84 months of age. How to use CHG solution Use CHG only as told by your health care provider, and follow the instructions on the label. Use the full amount of CHG as directed. Usually, this is one bottle. During a shower Follow these steps when using CHG solution during a shower (unless your health care provider gives you different instructions): Start the shower. Use your normal soap and shampoo to wash your face and hair. Turn off the shower or move out of the shower stream. Pour the CHG onto a  clean washcloth. Do not use any type of brush or rough-edged sponge. Starting at your neck, lather your body down to your toes. Make sure you follow these instructions: If you will be having surgery, pay special attention to the part of your body where you will be having surgery. Scrub this area for at least 1 minute. Do not use CHG on your head or face. If the solution gets into your ears or eyes, rinse them well with water. Avoid your genital area. Avoid any areas of skin that have broken skin, cuts, or scrapes. Scrub your back and under your arms. Make sure to wash skin folds. Let the lather sit on your skin for 1-2 minutes or as long as told by your health care provider. Thoroughly rinse your entire body in the shower. Make sure that all body creases and crevices are rinsed well. Dry off with a clean towel. Do not put any substances on your body afterward--such as powder, lotion, or perfume--unless you are told to do so by your health care provider. Only use lotions that are recommended by the manufacturer. Put on clean clothes or pajamas. If it is the night before your surgery, sleep in clean sheets.  During a sponge bath Follow these steps when using CHG solution during a sponge bath (unless your health care provider gives you different instructions): Use your normal soap and shampoo to wash your face and hair. Pour the CHG onto a clean washcloth. Starting at your neck, lather your body down to your toes. Make sure you follow these instructions: If you will be having surgery, pay special attention to the part of your body where you will be having surgery. Scrub this area for at least 1 minute. Do not use CHG on your head or face. If the solution gets into your ears or eyes, rinse them well with water. Avoid your genital area. Avoid any areas of skin that have broken skin, cuts, or scrapes. Scrub your back and under your arms. Make sure to wash skin folds. Let the lather sit on your skin  for 1-2 minutes or as long as told by your health care provider. Using a different clean, wet washcloth, thoroughly rinse your entire body. Make sure that all body creases and crevices are rinsed well. Dry off with a clean towel. Do not put any substances on your body afterward--such as powder, lotion, or perfume--unless you are told to do so by your health care provider. Only use lotions that are recommended by the manufacturer. Put on clean clothes or pajamas. If it is the night before your surgery, sleep in clean sheets. How to use CHG prepackaged cloths Only use CHG cloths as told by your health care provider, and follow the instructions on the label. Use the CHG cloth on clean, dry skin. Do not use the CHG cloth on your head or face unless your health care provider tells you to. When washing with the CHG cloth: Avoid your genital area. Avoid any areas of skin that have broken skin, cuts, or scrapes. Before surgery Follow these steps when using a CHG cloth to clean before surgery (unless your health care provider gives you different instructions): Using the CHG cloth, vigorously scrub the part of your body where you will be having surgery. Scrub using a back-and-forth motion for 3 minutes. The area on your body should be completely wet with CHG when you are done scrubbing. Do not rinse. Discard the cloth and let the area air-dry. Do not  put any substances on the area afterward, such as powder, lotion, or perfume. Put on clean clothes or pajamas. If it is the night before your surgery, sleep in clean sheets.  For general bathing Follow these steps when using CHG cloths for general bathing (unless your health care provider gives you different instructions). Use a separate CHG cloth for each area of your body. Make sure you wash between any folds of skin and between your fingers and toes. Wash your body in the following order, switching to a new cloth after each step: The front of your neck,  shoulders, and chest. Both of your arms, under your arms, and your hands. Your stomach and groin area, avoiding the genitals. Your right leg and foot. Your left leg and foot. The back of your neck, your back, and your buttocks. Do not rinse. Discard the cloth and let the area air-dry. Do not put any substances on your body afterward--such as powder, lotion, or perfume--unless you are told to do so by your health care provider. Only use lotions that are recommended by the manufacturer. Put on clean clothes or pajamas. Contact a health care provider if: Your skin gets irritated after scrubbing. You have questions about using your solution or cloth. You swallow any chlorhexidine. Call your local poison control center ([phone] in the [place] Get help right away if: Your eyes itch badly, or they become very red or swollen. Your skin itches badly and is red or swollen. Your hearing changes. You have trouble seeing. You have swelling or tingling in your mouth or throat. You have trouble breathing. These symptoms may represent a serious problem that is an emergency. Do not wait to see if the symptoms will go away. Get medical help right away. Call your local emergency services (911 in the [place] Do not drive yourself to the hospital. Summary Chlorhexidine gluconate (CHG) is a germ-killing (antiseptic) solution that is used to clean the skin. Cleaning your skin with CHG may help to lower your risk for infection. You may be given CHG to use for bathing. It may be in a bottle or in a prepackaged cloth to use on your skin. Carefully follow your health care provider's instructions and the instructions on the product label. Do not use CHG if you have a chlorhexidine allergy. Contact your health care provider if your skin gets irritated after scrubbing. This information is not intended to replace advice given to you by your health care provider. Make sure you discuss any questions you have with your  health care provider. Document Revised: 02/08/2022 Document Reviewed: 12/22/2020 Elsevier Patient Education  2023 [employer].

## 2023-04-13 ENCOUNTER — Other Ambulatory Visit: Payer: Self-pay

## 2023-04-13 ENCOUNTER — Encounter (HOSPITAL_COMMUNITY)
Admission: RE | Admit: 2023-04-13 | Discharge: 2023-04-13 | Disposition: A | Discharge status: Home / Self Care | Payer: Medicare Other | Source: Ambulatory Visit | Attending: Orthopedic Surgery | Admitting: Orthopedic Surgery

## 2023-04-13 ENCOUNTER — Encounter (HOSPITAL_COMMUNITY): Payer: Self-pay

## 2023-04-13 VITALS — BP 152/86 | HR 80 | Temp 97.8°F | Resp 18 | Ht 62.0 in | Wt 186.0 lb

## 2023-04-13 DIAGNOSIS — Z01818 Encounter for other preprocedural examination: Secondary | ICD-10-CM

## 2023-04-13 DIAGNOSIS — Z01812 Encounter for preprocedural laboratory examination: Secondary | ICD-10-CM | POA: Diagnosis not present

## 2023-04-13 DIAGNOSIS — M1711 Unilateral primary osteoarthritis, right knee: Secondary | ICD-10-CM | POA: Diagnosis not present

## 2023-04-13 LAB — CBC WITH DIFFERENTIAL/PLATELET
Abs Immature Granulocytes: 0.01 10*3/uL (ref 0.00–0.07)
Basophils Absolute: 0 10*3/uL (ref 0.0–0.1)
Basophils Relative: 1 %
Eosinophils Absolute: 0.1 10*3/uL (ref 0.0–0.5)
Eosinophils Relative: 2 %
HCT: 36.5 % (ref 36.0–46.0)
Hemoglobin: 11.3 g/dL — ABNORMAL LOW (ref 12.0–15.0)
Immature Granulocytes: 0 %
Lymphocytes Relative: 41 %
Lymphs Abs: 1.7 10*3/uL (ref 0.7–4.0)
MCH: 24.9 pg — ABNORMAL LOW (ref 26.0–34.0)
MCHC: 31 g/dL (ref 30.0–36.0)
MCV: 80.4 fL (ref 80.0–100.0)
Monocytes Absolute: 0.3 10*3/uL (ref 0.1–1.0)
Monocytes Relative: 8 %
Neutro Abs: 2 10*3/uL (ref 1.7–7.7)
Neutrophils Relative %: 48 %
Platelets: 212 10*3/uL (ref 150–400)
RBC: 4.54 MIL/uL (ref 3.87–5.11)
RDW: 17.5 % — ABNORMAL HIGH (ref 11.5–15.5)
WBC: 4.2 10*3/uL (ref 4.0–10.5)
nRBC: 0 % (ref 0.0–0.2)

## 2023-04-13 LAB — BASIC METABOLIC PANEL
Anion gap: 7 (ref 5–15)
BUN: 15 mg/dL (ref 8–23)
CO2: 27 mmol/L (ref 22–32)
Calcium: 9 mg/dL (ref 8.9–10.3)
Chloride: 103 mmol/L (ref 98–111)
Creatinine, Ser: 0.67 mg/dL (ref 0.44–1.00)
GFR, Estimated: >60 mL/min (ref >60)
Glucose, Bld: 100 mg/dL — ABNORMAL HIGH (ref 70–99)
Potassium: 3.9 mmol/L (ref 3.5–5.1)
Sodium: 137 mmol/L (ref 135–145)

## 2023-04-13 LAB — SURGICAL PCR SCREEN
MRSA, PCR: NEGATIVE
Staphylococcus aureus: NEGATIVE

## 2023-04-13 LAB — TYPE AND SCREEN

## 2023-04-13 LAB — PREPARE RBC (CROSSMATCH)

## 2023-04-15 NOTE — H&P (Signed)
TOTAL KNEE ADMISSION H&P  Patient is being admitted for right total knee arthroplasty.  Subjective:  Chief Complaint:right knee pain.  HPI: Kelly Fox, 72 y.o. female, she has a history of pain in her right knee which is failed nonoperative and nonsurgical interventions.  She underwent a successful left total knee and wishes to have the right one done as well.  Her symptoms have been going on for several years probably more than 10 years with gradual progression with increasing pain and loss of function  She has increased pain with activity and weightbearing  Patient Active Problem List   Diagnosis Date Noted   Status post total left knee replacement February 02, 2022 01/28/2023   Patellar clunk syndrome of left knee 10/29/2022   Osteoarthritis of left knee 02/02/2022   Unilateral primary osteoarthritis, left knee    Elevated blood-pressure reading without diagnosis of hypertension 12/03/2021   Seasonal allergic rhinitis 12/03/2021   Past Medical History:  Diagnosis Date   Asthma     Past Surgical History:  Procedure Laterality Date   DENTAL SURGERY     KNEE ARTHROSCOPY Left 10/29/2022   Procedure: ARTHROSCOPY KNEE LEFT FOR SCAR TISSUE DEBRIDEMENT;  Surgeon: Vickki Hearing, MD;  Location: [facility];  Service: Orthopedics;  Laterality: Left;   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/02/2022   Procedure: TOTAL KNEE ARTHROPLASTY;  Surgeon: Vickki Hearing, MD;  Location: [facility];  Service: Orthopedics;  Laterality: Left;    Current Facility-Administered Medications  Medication Dose Route Frequency Provider Last Rate Last Admin   bupivacaine-meloxicam ER (ZYNRELEF) injection 400 mg  400 mg Infiltration Once Vickki Hearing, MD       Current Outpatient Medications  Medication Sig Dispense Refill Last Dose   albuterol (VENTOLIN HFA) 108 (90 Base) MCG/ACT inhaler Inhale 2 puffs into the lungs every 6 (six) hours as needed for wheezing or shortness of breath.      aspirin EC 81 MG  tablet Take 81 mg by mouth daily. Swallow whole.      Multiple Vitamins-Minerals (CENTRUM SILVER ADULT 50+ PO) Take 1 tablet by mouth daily.      Allergies  Allergen Reactions   Contrast Media [Iodinated Contrast Media] Swelling and Rash    Social History   Tobacco Use   Smoking status: Never   Smokeless tobacco: Never  Substance Use Topics   Alcohol use: Not Currently    Family History  Problem Relation Age of Onset   Breast cancer Sister      Review of Systems  No evidence of fevers shortness of breath chest pain GERD easy bleeding numbness tingling  Objective:  Physical Exam Vitals and nursing note reviewed.  Constitutional:      General: She is not in acute distress.    Appearance: Normal appearance. She is normal weight. She is not ill-appearing, toxic-appearing or diaphoretic.  HENT:     Head: Normocephalic and atraumatic.     Nose: No congestion or rhinorrhea.     Mouth/Throat:     Mouth: Mucous membranes are moist.     Pharynx: No oropharyngeal exudate or posterior oropharyngeal erythema.  Eyes:     General: No scleral icterus.       Right eye: No discharge.        Left eye: No discharge.     Extraocular Movements: Extraocular movements intact.     Conjunctiva/sclera: Conjunctivae normal.     Pupils: Pupils are equal, round, and reactive to light.  Cardiovascular:  Rate and Rhythm: Normal rate and regular rhythm.     Pulses: Normal pulses.  Pulmonary:     Effort: Pulmonary effort is normal.  Abdominal:     General: Abdomen is flat. There is no distension.     Palpations: Abdomen is soft.  Musculoskeletal:     Cervical back: Normal range of motion.  Skin:    Capillary Refill: Capillary refill takes more than 3 seconds.  Neurological:     General: No focal deficit present.     Mental Status: She is alert and oriented to person, place, and time. Mental status is at baseline.     Cranial Nerves: No cranial nerve deficit.     Motor: No weakness.      Coordination: Coordination normal.     Gait: Gait normal.     Deep Tendon Reflexes: Reflexes normal.  Psychiatric:        Mood and Affect: Mood normal.        Behavior: Behavior normal.        Thought Content: Thought content normal.        Judgment: Judgment normal.    Right knee skin envelope is normal The medial joint line is tender small joint effusion noted crepitance on range of motion She has a flexion contracture it is probably in the neighborhood of 8 degrees with flexion arc of 110 degrees No instability or pseudolaxity is noted Muscle tone and strength are normal  Vital signs in last 24 hours:    Labs:   Estimated body mass index is 34.02 kg/m as calculated from the following:   Height as of 04/13/23: 5\' 2"  (1.575 m).   Weight as of 04/13/23: 84.4 kg.   Imaging Review Plain radiographs demonstrate severe degenerative joint disease of the right knee(s). The overall alignment issignificant varus. The bone quality appears to be good for age and reported activity level.      Assessment/Plan:  RIGHT TOTAL KNEE  End stage arthritis, right knee   The patient history, physical examination, clinical judgment of the provider and imaging studies are consistent with end stage degenerative joint disease of the right knee(s) and total knee arthroplasty is deemed medically necessary. The treatment options including medical management, injection therapy arthroscopy and arthroplasty were discussed at length. The risks and benefits of total knee arthroplasty were presented and reviewed. The risks due to aseptic loosening, infection, stiffness, patella tracking problems, thromboembolic complications and other imponderables were discussed. The patient acknowledged the explanation, agreed to proceed with the plan and consent was signed. Patient is being admitted for inpatient treatment for surgery, pain control, PT, OT, prophylactic antibiotics, VTE prophylaxis, progressive ambulation  and ADL's and discharge planning. The patient is planning to be discharged home with home health services

## 2023-04-18 LAB — BPAM RBC: Blood Product Expiration Date: 202407172359

## 2023-04-18 LAB — TYPE AND SCREEN: Antibody Screen: NEGATIVE

## 2023-04-19 ENCOUNTER — Ambulatory Visit (HOSPITAL_COMMUNITY): Payer: Medicare Other

## 2023-04-19 ENCOUNTER — Other Ambulatory Visit: Payer: Self-pay

## 2023-04-19 ENCOUNTER — Encounter (HOSPITAL_COMMUNITY): Payer: Self-pay | Admitting: Orthopedic Surgery

## 2023-04-19 ENCOUNTER — Ambulatory Visit (HOSPITAL_BASED_OUTPATIENT_CLINIC_OR_DEPARTMENT_OTHER): Payer: Medicare Other | Admitting: Anesthesiology

## 2023-04-19 ENCOUNTER — Ambulatory Visit (HOSPITAL_COMMUNITY): Payer: Medicare Other | Admitting: Anesthesiology

## 2023-04-19 ENCOUNTER — Encounter (HOSPITAL_COMMUNITY)
Admission: RE | Disposition: A | Discharge status: Home / Self Care | Payer: Self-pay | Source: Ambulatory Visit | Attending: Orthopedic Surgery

## 2023-04-19 ENCOUNTER — Observation Stay (HOSPITAL_COMMUNITY)
Admission: RE | Admit: 2023-04-19 | Discharge: 2023-04-20 | Disposition: A | Discharge status: Home / Self Care | Payer: Medicare Other | Source: Ambulatory Visit | Attending: Orthopedic Surgery | Admitting: Orthopedic Surgery

## 2023-04-19 DIAGNOSIS — Z7982 Long term (current) use of aspirin: Secondary | ICD-10-CM | POA: Diagnosis not present

## 2023-04-19 DIAGNOSIS — Z96651 Presence of right artificial knee joint: Secondary | ICD-10-CM | POA: Diagnosis not present

## 2023-04-19 DIAGNOSIS — Z96652 Presence of left artificial knee joint: Secondary | ICD-10-CM | POA: Insufficient documentation

## 2023-04-19 DIAGNOSIS — Z79899 Other long term (current) drug therapy: Secondary | ICD-10-CM | POA: Diagnosis not present

## 2023-04-19 DIAGNOSIS — J45909 Unspecified asthma, uncomplicated: Secondary | ICD-10-CM | POA: Diagnosis not present

## 2023-04-19 DIAGNOSIS — Z471 Aftercare following joint replacement surgery: Secondary | ICD-10-CM | POA: Diagnosis not present

## 2023-04-19 DIAGNOSIS — M1711 Unilateral primary osteoarthritis, right knee: Secondary | ICD-10-CM | POA: Diagnosis not present

## 2023-04-19 HISTORY — PX: TOTAL KNEE ARTHROPLASTY: SHX125

## 2023-04-19 SURGERY — ARTHROPLASTY, KNEE, TOTAL
Anesthesia: Spinal | Site: Knee | Laterality: Right

## 2023-04-19 MED ORDER — CEFAZOLIN SODIUM-DEXTROSE 2-4 GM/100ML-% IV SOLN
2.0000 g | INTRAVENOUS | Status: AC
  Administered 2023-04-19: 2 g via INTRAVENOUS
  Filled 2023-04-19: qty 100

## 2023-04-19 MED ORDER — MORPHINE SULFATE (PF) 2 MG/ML IV SOLN: 0.5000 mg | INTRAVENOUS | Status: DC | PRN

## 2023-04-19 MED ORDER — FENTANYL CITRATE (PF) 100 MCG/2ML IJ SOLN
INTRAMUSCULAR | Status: DC | PRN
  Administered 2023-04-19 (×2): 25 ug via INTRAVENOUS
  Administered 2023-04-19: 5 ug via INTRAVENOUS
  Administered 2023-04-19: 25 ug via INTRAVENOUS

## 2023-04-19 MED ORDER — PHENOL 1.4 % MT LIQD: 1.0000 | OROMUCOSAL | Status: DC | PRN

## 2023-04-19 MED ORDER — ONDANSETRON HCL 4 MG/2ML IJ SOLN
INTRAMUSCULAR | Status: DC | PRN
  Administered 2023-04-19: 4 mg via INTRAVENOUS

## 2023-04-19 MED ORDER — BUPIVACAINE-MELOXICAM ER 200-6 MG/7ML IJ SOLN
INTRAMUSCULAR | Status: AC
  Filled 2023-04-19: qty 2

## 2023-04-19 MED ORDER — DOCUSATE SODIUM 100 MG PO CAPS
100.0000 mg | ORAL_CAPSULE | Freq: Two times a day (BID) | ORAL | Status: DC
  Administered 2023-04-19 – 2023-04-20 (×3): 100 mg via ORAL
  Filled 2023-04-19 (×3): qty 1

## 2023-04-19 MED ORDER — POLYETHYLENE GLYCOL 3350 17 G PO PACK: 17.0000 g | PACK | Freq: Every day | ORAL | Status: DC | PRN

## 2023-04-19 MED ORDER — 0.9 % SODIUM CHLORIDE (POUR BTL) OPTIME
TOPICAL | Status: DC | PRN
  Administered 2023-04-19: 1000 mL

## 2023-04-19 MED ORDER — ALUM & MAG HYDROXIDE-SIMETH 200-200-20 MG/5ML PO SUSP: 30.0000 mL | ORAL | Status: DC | PRN

## 2023-04-19 MED ORDER — ORAL CARE MOUTH RINSE: 15.0000 mL | Freq: Once | OROMUCOSAL | Status: DC

## 2023-04-19 MED ORDER — OXYCODONE HCL 5 MG PO TABS
5.0000 mg | ORAL_TABLET | Freq: Once | ORAL | Status: AC | PRN
  Administered 2023-04-19: 5 mg via ORAL

## 2023-04-19 MED ORDER — DEXAMETHASONE SODIUM PHOSPHATE 10 MG/ML IJ SOLN
INTRAMUSCULAR | Status: DC | PRN
  Administered 2023-04-19: 5 mg via INTRAVENOUS

## 2023-04-19 MED ORDER — SODIUM CHLORIDE 0.9 % IR SOLN
Status: DC | PRN
  Administered 2023-04-19: 3000 mL

## 2023-04-19 MED ORDER — CELECOXIB 100 MG PO CAPS
200.0000 mg | ORAL_CAPSULE | Freq: Every day | ORAL | Status: DC
  Administered 2023-04-19 – 2023-04-20 (×2): 200 mg via ORAL
  Filled 2023-04-19 (×2): qty 2

## 2023-04-19 MED ORDER — METHOCARBAMOL 1000 MG/10ML IJ SOLN
INTRAMUSCULAR | Status: AC
  Filled 2023-04-19: qty 10

## 2023-04-19 MED ORDER — PHENYLEPHRINE HCL (PRESSORS) 10 MG/ML IV SOLN
INTRAVENOUS | Status: DC | PRN
  Administered 2023-04-19 (×2): 80 ug via INTRAVENOUS

## 2023-04-19 MED ORDER — EPHEDRINE SULFATE (PRESSORS) 50 MG/ML IJ SOLN
INTRAMUSCULAR | Status: DC | PRN
  Administered 2023-04-19 (×2): 5 mg via INTRAVENOUS

## 2023-04-19 MED ORDER — TRANEXAMIC ACID-NACL 1000-0.7 MG/100ML-% IV SOLN
1000.0000 mg | Freq: Once | INTRAVENOUS | Status: AC
  Administered 2023-04-19: 1000 mg via INTRAVENOUS
  Filled 2023-04-19: qty 100

## 2023-04-19 MED ORDER — ALBUTEROL SULFATE (2.5 MG/3ML) 0.083% IN NEBU: 3.0000 mL | INHALATION_SOLUTION | Freq: Four times a day (QID) | RESPIRATORY_TRACT | Status: DC | PRN

## 2023-04-19 MED ORDER — METOCLOPRAMIDE HCL 10 MG PO TABS: 5.0000 mg | ORAL_TABLET | Freq: Three times a day (TID) | ORAL | Status: DC | PRN

## 2023-04-19 MED ORDER — POVIDONE-IODINE 10 % EX SWAB: 2.0000 | Freq: Once | CUTANEOUS | Status: DC

## 2023-04-19 MED ORDER — HYDROCODONE-ACETAMINOPHEN 5-325 MG PO TABS: 1.0000 | ORAL_TABLET | ORAL | Status: DC | PRN

## 2023-04-19 MED ORDER — SODIUM CHLORIDE 0.9 % IV SOLN: INTRAVENOUS | Status: DC

## 2023-04-19 MED ORDER — ACETAMINOPHEN 325 MG PO TABS: 325.0000 mg | ORAL_TABLET | Freq: Four times a day (QID) | ORAL | Status: DC | PRN

## 2023-04-19 MED ORDER — MENTHOL 3 MG MT LOZG: 1.0000 | LOZENGE | OROMUCOSAL | Status: DC | PRN

## 2023-04-19 MED ORDER — CHLORHEXIDINE GLUCONATE 0.12 % MT SOLN: 15.0000 mL | Freq: Once | OROMUCOSAL | Status: DC

## 2023-04-19 MED ORDER — PANTOPRAZOLE SODIUM 40 MG PO TBEC
40.0000 mg | DELAYED_RELEASE_TABLET | Freq: Every day | ORAL | Status: DC
  Administered 2023-04-19 – 2023-04-20 (×2): 40 mg via ORAL
  Filled 2023-04-19 (×2): qty 1

## 2023-04-19 MED ORDER — ACETAMINOPHEN 500 MG PO TABS
500.0000 mg | ORAL_TABLET | Freq: Four times a day (QID) | ORAL | Status: AC
  Administered 2023-04-19 – 2023-04-20 (×4): 500 mg via ORAL
  Filled 2023-04-19 (×4): qty 1

## 2023-04-19 MED ORDER — PROPOFOL 10 MG/ML IV BOLUS
INTRAVENOUS | Status: AC
  Filled 2023-04-19: qty 20

## 2023-04-19 MED ORDER — FENTANYL CITRATE (PF) 100 MCG/2ML IJ SOLN
INTRAMUSCULAR | Status: DC | PRN
  Administered 2023-04-19: 20 ug via INTRATHECAL

## 2023-04-19 MED ORDER — CEFAZOLIN SODIUM-DEXTROSE 2-4 GM/100ML-% IV SOLN
2.0000 g | INTRAVENOUS | Status: DC
Start: 2023-04-19 — End: 2023-04-19

## 2023-04-19 MED ORDER — OXYCODONE HCL 5 MG/5ML PO SOLN: 5.0000 mg | Freq: Once | ORAL | Status: AC | PRN

## 2023-04-19 MED ORDER — LACTATED RINGERS IV SOLN: INTRAVENOUS | Status: DC

## 2023-04-19 MED ORDER — LIDOCAINE HCL (PF) 2 % IJ SOLN
INTRAMUSCULAR | Status: AC
  Filled 2023-04-19: qty 10

## 2023-04-19 MED ORDER — DEXMEDETOMIDINE HCL IN NACL 80 MCG/20ML IV SOLN
INTRAVENOUS | Status: AC
  Filled 2023-04-19: qty 20

## 2023-04-19 MED ORDER — BUPIVACAINE IN DEXTROSE 0.75-8.25 % IT SOLN
INTRATHECAL | Status: DC | PRN
  Administered 2023-04-19: 11.25 mg via INTRATHECAL

## 2023-04-19 MED ORDER — HYDROCODONE-ACETAMINOPHEN 7.5-325 MG PO TABS: 1.0000 | ORAL_TABLET | ORAL | Status: DC | PRN

## 2023-04-19 MED ORDER — BUPIVACAINE-MELOXICAM ER 200-6 MG/7ML IJ SOLN
INTRAMUSCULAR | Status: DC | PRN
  Administered 2023-04-19: 400 mg

## 2023-04-19 MED ORDER — FENTANYL CITRATE PF 50 MCG/ML IJ SOSY: 25.0000 ug | PREFILLED_SYRINGE | INTRAMUSCULAR | Status: DC | PRN

## 2023-04-19 MED ORDER — CEFAZOLIN SODIUM-DEXTROSE 2-4 GM/100ML-% IV SOLN
2.0000 g | Freq: Four times a day (QID) | INTRAVENOUS | Status: AC
  Administered 2023-04-19 (×2): 2 g via INTRAVENOUS
  Filled 2023-04-19 (×2): qty 100

## 2023-04-19 MED ORDER — DEXAMETHASONE SODIUM PHOSPHATE 10 MG/ML IJ SOLN
INTRAMUSCULAR | Status: AC
  Filled 2023-04-19: qty 1

## 2023-04-19 MED ORDER — ROPIVACAINE HCL 5 MG/ML IJ SOLN
INTRAMUSCULAR | Status: AC
  Filled 2023-04-19: qty 30

## 2023-04-19 MED ORDER — METHOCARBAMOL 1000 MG/10ML IJ SOLN
500.0000 mg | Freq: Four times a day (QID) | INTRAVENOUS | Status: DC
  Administered 2023-04-19 – 2023-04-20 (×4): 500 mg via INTRAVENOUS
  Filled 2023-04-19: qty 5
  Filled 2023-04-19: qty 500
  Filled 2023-04-19 (×5): qty 5

## 2023-04-19 MED ORDER — TRAMADOL HCL 50 MG PO TABS
50.0000 mg | ORAL_TABLET | Freq: Four times a day (QID) | ORAL | Status: DC
  Administered 2023-04-19 – 2023-04-20 (×4): 50 mg via ORAL
  Filled 2023-04-19 (×4): qty 1

## 2023-04-19 MED ORDER — PROPOFOL 500 MG/50ML IV EMUL
INTRAVENOUS | Status: AC
  Filled 2023-04-19: qty 50

## 2023-04-19 MED ORDER — ADULT MULTIVITAMIN W/MINERALS CH
1.0000 | ORAL_TABLET | Freq: Every day | ORAL | Status: DC
  Administered 2023-04-19 – 2023-04-20 (×2): 1 via ORAL
  Filled 2023-04-19 (×2): qty 1

## 2023-04-19 MED ORDER — METHOCARBAMOL 500 MG PO TABS
500.0000 mg | ORAL_TABLET | Freq: Four times a day (QID) | ORAL | Status: DC | PRN
  Administered 2023-04-19: 500 mg via ORAL
  Filled 2023-04-19: qty 1

## 2023-04-19 MED ORDER — FENTANYL CITRATE (PF) 100 MCG/2ML IJ SOLN
INTRAMUSCULAR | Status: AC
  Filled 2023-04-19: qty 2

## 2023-04-19 MED ORDER — PHENYLEPHRINE 80 MCG/ML (10ML) SYRINGE FOR IV PUSH (FOR BLOOD PRESSURE SUPPORT)
PREFILLED_SYRINGE | INTRAVENOUS | Status: AC
  Filled 2023-04-19: qty 10

## 2023-04-19 MED ORDER — METOCLOPRAMIDE HCL 5 MG/ML IJ SOLN: 5.0000 mg | Freq: Three times a day (TID) | INTRAMUSCULAR | Status: DC | PRN

## 2023-04-19 MED ORDER — CHLORHEXIDINE GLUCONATE 0.12 % MT SOLN
OROMUCOSAL | Status: AC
  Filled 2023-04-19: qty 75

## 2023-04-19 MED ORDER — ONDANSETRON HCL 4 MG/2ML IJ SOLN: 4.0000 mg | Freq: Once | INTRAMUSCULAR | Status: DC | PRN

## 2023-04-19 MED ORDER — ONDANSETRON HCL 4 MG PO TABS: 4.0000 mg | ORAL_TABLET | Freq: Four times a day (QID) | ORAL | Status: DC | PRN

## 2023-04-19 MED ORDER — ONDANSETRON HCL 4 MG/2ML IJ SOLN: 4.0000 mg | Freq: Four times a day (QID) | INTRAMUSCULAR | Status: DC | PRN

## 2023-04-19 MED ORDER — ONDANSETRON HCL 4 MG/2ML IJ SOLN
4.0000 mg | Freq: Four times a day (QID) | INTRAMUSCULAR | Status: DC
  Administered 2023-04-19 – 2023-04-20 (×4): 4 mg via INTRAVENOUS
  Filled 2023-04-19 (×4): qty 2

## 2023-04-19 MED ORDER — PROPOFOL 500 MG/50ML IV EMUL
INTRAVENOUS | Status: DC | PRN
  Administered 2023-04-19: 35 ug/kg/min via INTRAVENOUS
  Administered 2023-04-19: 25 ug/kg/min via INTRAVENOUS

## 2023-04-19 MED ORDER — OXYCODONE HCL 5 MG PO TABS
5.0000 mg | ORAL_TABLET | ORAL | Status: DC
  Administered 2023-04-19 – 2023-04-20 (×3): 5 mg via ORAL
  Filled 2023-04-19 (×5): qty 1

## 2023-04-19 MED ORDER — ASPIRIN 81 MG PO CHEW
81.0000 mg | CHEWABLE_TABLET | Freq: Two times a day (BID) | ORAL | Status: DC
  Administered 2023-04-19 – 2023-04-20 (×2): 81 mg via ORAL
  Filled 2023-04-19 (×2): qty 1

## 2023-04-19 MED ORDER — PREGABALIN 50 MG PO CAPS
50.0000 mg | ORAL_CAPSULE | Freq: Three times a day (TID) | ORAL | Status: DC
  Administered 2023-04-19 – 2023-04-20 (×4): 50 mg via ORAL
  Filled 2023-04-19: qty 1
  Filled 2023-04-19: qty 2
  Filled 2023-04-19 (×2): qty 1

## 2023-04-19 MED ORDER — PROPOFOL 10 MG/ML IV BOLUS
INTRAVENOUS | Status: DC | PRN
  Administered 2023-04-19 (×4): 30 mg via INTRAVENOUS
  Administered 2023-04-19: 20 mg via INTRAVENOUS
  Administered 2023-04-19: 30 mg via INTRAVENOUS

## 2023-04-19 MED ORDER — ONDANSETRON HCL 4 MG/2ML IJ SOLN
INTRAMUSCULAR | Status: AC
  Filled 2023-04-19: qty 2

## 2023-04-19 MED ORDER — DEXAMETHASONE SODIUM PHOSPHATE 10 MG/ML IJ SOLN
10.0000 mg | Freq: Once | INTRAMUSCULAR | Status: AC
  Administered 2023-04-20: 10 mg via INTRAVENOUS
  Filled 2023-04-19: qty 1

## 2023-04-19 MED ORDER — EPHEDRINE 5 MG/ML INJ
INTRAVENOUS | Status: AC
  Filled 2023-04-19: qty 5

## 2023-04-19 MED ORDER — SUCCINYLCHOLINE CHLORIDE 200 MG/10ML IV SOSY
PREFILLED_SYRINGE | INTRAVENOUS | Status: AC
  Filled 2023-04-19: qty 30

## 2023-04-19 MED ORDER — DEXMEDETOMIDINE HCL IN NACL 80 MCG/20ML IV SOLN
INTRAVENOUS | Status: DC | PRN
  Administered 2023-04-19 (×2): 4 ug via INTRAVENOUS
  Administered 2023-04-19: 8 ug via INTRAVENOUS

## 2023-04-19 MED ORDER — TRANEXAMIC ACID-NACL 1000-0.7 MG/100ML-% IV SOLN
1000.0000 mg | INTRAVENOUS | Status: AC
  Administered 2023-04-19: 1000 mg via INTRAVENOUS
  Filled 2023-04-19: qty 100

## 2023-04-19 MED ORDER — METHOCARBAMOL 1000 MG/10ML IJ SOLN: 500.0000 mg | Freq: Four times a day (QID) | INTRAVENOUS | Status: DC | PRN

## 2023-04-19 SURGICAL SUPPLY — 61 items
ATTUNE MED DOME PAT 32 KNEE (Knees) IMPLANT
ATTUNE PSFEM RTSZ5 NARCEM KNEE (Femur) IMPLANT
BANDAGE ESMARK 6X9 LF (GAUZE/BANDAGES/DRESSINGS) ×1 IMPLANT
BASEPLATE TIB CMT FB PCKT SZ4 (Stem) IMPLANT
BLADE SAGITTAL 25.0X1.27X90 (BLADE) ×1 IMPLANT
BLADE SAW SGTL 11.0X1.19X90.0M (BLADE) ×1 IMPLANT
BLADE SURG SZ10 CARB STEEL (BLADE) ×1 IMPLANT
BNDG CMPR 9X6 STRL LF SNTH (GAUZE/BANDAGES/DRESSINGS) ×1
BNDG ESMARK 6X9 LF (GAUZE/BANDAGES/DRESSINGS) ×1
BSPLAT TIB 4 CMNT FX BRNG STRL (Stem) ×1 IMPLANT
CEMENT HV SMART SET (Cement) ×2 IMPLANT
CLOTH BEACON ORANGE TIMEOUT ST (SAFETY) ×1 IMPLANT
COOLER ICEMAN CLASSIC (MISCELLANEOUS) ×1 IMPLANT
COVER LIGHT HANDLE STERIS (MISCELLANEOUS) ×2 IMPLANT
CUFF TOURN SGL QUICK 34 (TOURNIQUET CUFF) ×1
CUFF TRNQT CYL 34X4.125X (TOURNIQUET CUFF) ×1 IMPLANT
DRAPE BACK TABLE (DRAPES) ×1 IMPLANT
DRAPE EXTREMITY T 121X128X90 (DISPOSABLE) ×1 IMPLANT
DRESSING AQUACEL AG ADV 3.5X12 (MISCELLANEOUS) ×1 IMPLANT
DRSG AQUACEL AG ADV 3.5X12 (MISCELLANEOUS) ×1
DURAPREP 26ML APPLICATOR (WOUND CARE) ×2 IMPLANT
ELECT REM PT RETURN 9FT ADLT (ELECTROSURGICAL) ×1
ELECTRODE REM PT RTRN 9FT ADLT (ELECTROSURGICAL) ×1 IMPLANT
GLOVE BIO SURGEON STRL SZ7 (GLOVE) IMPLANT
GLOVE BIOGEL PI IND STRL 7.0 (GLOVE) ×3 IMPLANT
GLOVE BIOGEL PI IND STRL 8.5 (GLOVE) ×1 IMPLANT
GLOVE ECLIPSE 6.5 STRL STRAW (GLOVE) IMPLANT
GLOVE SKINSENSE STRL SZ8.0 LF (GLOVE) ×1 IMPLANT
GOWN STRL REUS W/TWL LRG LVL3 (GOWN DISPOSABLE) ×3 IMPLANT
GOWN STRL REUS W/TWL XL LVL3 (GOWN DISPOSABLE) ×1 IMPLANT
HANDPIECE INTERPULSE COAX TIP (DISPOSABLE) ×1
HOOD W/PEELAWAY (MISCELLANEOUS) ×4 IMPLANT
INSERT TIB FIX BEARNG SZ 5 5MM (Insert) IMPLANT
INST SET MAJOR BONE (KITS) ×1 IMPLANT
IV NS IRRIG 3000ML ARTHROMATIC (IV SOLUTION) ×1 IMPLANT
KIT BLADEGUARD II DBL (SET/KITS/TRAYS/PACK) ×1 IMPLANT
KIT TURNOVER KIT A (KITS) ×1 IMPLANT
MANIFOLD NEPTUNE II (INSTRUMENTS) ×1 IMPLANT
MARKER SKIN DUAL TIP RULER LAB (MISCELLANEOUS) ×1 IMPLANT
NDL HYPO 21X1.5 SAFETY (NEEDLE) IMPLANT
NEEDLE HYPO 21X1.5 SAFETY (NEEDLE) IMPLANT
NS IRRIG 1000ML POUR BTL (IV SOLUTION) ×1 IMPLANT
PACK TOTAL JOINT (CUSTOM PROCEDURE TRAY) ×1 IMPLANT
PAD ARMBOARD 7.5X6 YLW CONV (MISCELLANEOUS) ×1 IMPLANT
PAD COLD SHLDR SM WRAP-ON (PAD) ×1 IMPLANT
PILLOW KNEE EXTENSION 0 DEG (MISCELLANEOUS) ×1 IMPLANT
PIN/DRILL PACK ORTHO 1/8X3.0 (PIN) ×1 IMPLANT
SAW OSC TIP CART 19.5X105X1.3 (SAW) ×1 IMPLANT
SET BASIN LINEN APH (SET/KITS/TRAYS/PACK) ×1 IMPLANT
SET HNDPC FAN SPRY TIP SCT (DISPOSABLE) ×1 IMPLANT
SOLUTION IRRIG SURGIPHOR (IV SOLUTION) ×1 IMPLANT
STAPLER VISISTAT 35W (STAPLE) ×1 IMPLANT
SUT BRALON NAB BRD #1 30IN (SUTURE) ×1 IMPLANT
SUT MNCRL 0 VIOLET CTX 36 (SUTURE) ×1 IMPLANT
SUT MON AB 0 CT1 (SUTURE) ×1 IMPLANT
SYR 20ML LL LF (SYRINGE) IMPLANT
SYR BULB IRRIG 60ML STRL (SYRINGE) ×1 IMPLANT
TOWER CARTRIDGE SMART MIX (DISPOSABLE) ×1 IMPLANT
TRAY FOLEY MTR SLVR 16FR STAT (SET/KITS/TRAYS/PACK) ×1 IMPLANT
WATER STERILE IRR 1000ML POUR (IV SOLUTION) ×2 IMPLANT
YANKAUER SUCT 12FT TUBE ARGYLE (SUCTIONS) ×1 IMPLANT

## 2023-04-19 NOTE — Transfer of Care (Addendum)
Immediate Anesthesia Transfer of Care Note  Patient: Kelly Fox  Procedure(s) Performed: TOTAL KNEE ARTHROPLASTY (Right: Knee)  Patient Location: PACU  Anesthesia Type:Spinal  Level of Consciousness: awake and patient cooperative  Airway & Oxygen Therapy: Patient Spontanous Breathing  Post-op Assessment: Report given to RN and Post -op Vital signs reviewed and stable  Post vital signs: Reviewed and stable  Last Vitals:  Vitals Value Taken Time  BP 100/70 04/19/23 1002  Temp 36.5 C 04/19/23 1002  Pulse 69 04/19/23 1006  Resp 9 04/19/23 1006  SpO2 91 % 04/19/23 1006  Vitals shown include unvalidated device data.  Last Pain:  Vitals:   04/19/23 1002  TempSrc:   PainSc: 0-No pain      Patients Stated Pain Goal: 7 (04/19/23 0641)  Complications: No notable events documented.

## 2023-04-19 NOTE — Brief Op Note (Signed)
Dictation for total knee replacement  04/19/2023  10:04 AM  Orthopaedic Surgery Operative Note (CSN: [id number])  Kelly Fox  01/08/1951 Date of Surgery: 04/19/2023   Diagnoses:  osteoarthritis right knee   Procedure: RIGHT Total knee arthroplasty   Operative Finding MEDIAL: GRADE 4  MEDIAL MENISCUS INTACT LATERAL GRADE2 LATERAL MENISCUS INTACT  PTF: 17 MM THICKNESS, GRADE 4  ACL PCL INTACT OSTEOPHYTES MINIMAL   Post-Op Diagnosis: Same Surgeons:Primary: Vickki Hearing, MD Assistants: Cecile Sheerer; ANGELA WHITT Location: [facility] ROOM 4 Anesthesia: SPINAL, POST OP NERVE BLOCK  Antibiotics: Ancef 2 g Tourniquet time:  Total Tourniquet Time Documented: Thigh (Right) - 92 minutes Total: Thigh (Right) - 92 minutes  Estimated Blood Loss: NONE Complications: None Specimens: None   Implants: Implant Name Type Inv. Item Serial No. Manufacturer Lot No. LRB No. Used Action  CEMENT HV SMART SET - [id number] Cement CEMENT HV SMART SET  [employer] [id number] Right 2 Implanted  INSERT TIB FIX BEARNG SZ 5 - [id number] Insert INSERT TIB FIX BEARNG SZ 5  [employer]PU[employer] [id number] Right 1 Implanted  ATTUNE MED DOME PAT 32 KNEE - [id number] Knees ATTUNE MED DOME PAT 32 KNEE  DEPU[employer] ORTH[employer] [id number] Right 1 Implanted  ATTUNE PSFEM [id number] NARCEM KNEE - [id number] Femur ATTUNE PSFEM [id number] NARCEM KNEE  [employer]PU[employer]TH[employer] [id number] Right 1 Implanted  BASEPLATE TIB CMT FB PCKT SZ4 - [id number] Stem BASEPLATE TIB CMT FB PCKT SZ4  [employer]PU[employer] ORTH[employer] [id number] Right 1 Implanted     Indications for Surgery:   Kelly Fox is a 72 y.o. female who PRESENTS WITH DISABLING RIGHT KNEE PAIN .  Benefits and risks of operative and nonoperative management were discussed prior to surgery with patient/guardian(s) and informed consent form was completed.  While all risks cannot be anticipated, specific risks including infection, need for additional surgery, stiffness, postop pain,  infection, implant removal, loosening, infection requiring amputation, deep vein thrombosis, pulmonary embolus were discussed.   Procedure:   The patient was identified properly. Informed consent was obtained and the surgical site was marked. The patient was taken to the OR where SPINAL anesthesia was induced.  The patient was positioned SUPINE.  A Foley catheter was inserted, the right KNEE was prepped and draped in the usual sterile fashion.  Timeout was performed before the beginning of the case.      The operative limb,  was exsanguinated with a six-inch Esmarch and the tourniquet was inflated to 300 mmHg.  A straight midline incision was made over the right KNEE and taken down to the extensor mechanism. A medial arthrotomy was performed. The patella was everted and the patellofemoral soft tissue was released, along with the patellar fat pad.  The anterior cruciate ligament and PCL were resected.  The anterior horns of the lateral and medial meniscus were resected. The medial soft tissue sleeve was elevated to the mid coronal plane.  A three-eighths inch drill bit was used to enter the femoral canal which was decompressed with suction and irrigation until clear.   The distal femoral cutting guide was set for 9 mm distal resection,  5valgus alignment, for a right knee. The distal femur was resected and checked for flatness.  The resection was measured and only measured 8 mm therefore an additional 2 mm distal femoral cut was performed  The attune sizing femoral guide was placed and the femur was preliminarily sized to a size 5.   The external alignment guide for the tibial resection was then applied to  the distal and proximal tibia and set for anatomic slope along with 3 MM resection  from the deficient medial side.   Rotational alignment was set using the malleolus, the tibial tubercle and the tibial spines.  The proximal tibia was resected along with  residual menisci. The tibia was  sized using a base plate to a size 4.   The extension gap was checked.  5 mm spacer block fit well with good medial lateral stability   A 4-in-1 cutting block was placed along with collateral ligament retractors.  Our target was proper rotation based on the epicondylar axis using Whitesides line as a guide as well.  Once the block was pinned in place we took the spacer block that balanced extension gap and placed it on top of the tibia under the femoral block.  This fit was a loose.  I moved the block down 1.5 mm (once the block was moved down an angel wing was placed to check for notching prior to the actual cut).  There was no notching noted with the angel wing so the block was kept in the 1-1/2 mm down position  Once I was satisfied with the spacer block collateral ligament retractors were placed and I completed the 4 distal femoral cuts   The extension gap was rechecked with the same spacer block which was a size 5   The correct sized notch cutting guide for the femur was then applied and the notch cut was made.  Trial reduction was completed using size 5 femur 4 tibia 5 poly trial implants. Patella tracking was normal  We then skeletonized the patella. It measured 17 in thickness and the patellar resection was performed with the guide leaving 11 mm of patella  The patella diameter measured 32. We then drilled the peg holes for the patella.  Completed patellar thickness was 11+8.5  The proximal tibia was prepared using the size 4 base plate.  Thorough irrigation was performed using saline  and the bone was dried and prepared for cement. The cement was mixed on the back table using third generation preparation techniques  The implants were then cemented in place and excess cement was removed. The cement was allowed to cure. Surgipor irrigation was placed followed by saline irrigation  Any excess bone fragments and cement was removed.  The extensor mechanism was closed with #1 Bralon  suture followed by subcutaneous tissue closure using 0 Monocryl suture in 2 layers  Zynrelef a total of 2 vials were injected prior to complete extensor mechanism closure.  The openings and the capsule were then closed with #1 Braylon   Skin approximation was performed using staples  A sterile dressing was applied, TED hose were placed on the operative extremity followed by Cryo/Cuff.  The patient was taken recovery room in stable condition  Postop plan: Weightbearing as tolerated CPM machine Immediate physical therapy Discharge tomorrow if stable

## 2023-04-19 NOTE — Anesthesia Procedure Notes (Signed)
Date/Time: 04/19/2023 8:05 AM  Performed by: Franco Nones, CRNAPre-anesthesia Checklist: Patient identified, Emergency Drugs available, Suction available, Timeout performed and Patient being monitored Patient Re-evaluated:Patient Re-evaluated prior to induction Oxygen Delivery Method: Non-rebreather mask

## 2023-04-19 NOTE — Anesthesia Procedure Notes (Signed)
Spinal  Start time: 04/19/2023 7:41 AM End time: 04/19/2023 7:54 AM Staffing Performed: anesthesiologist and resident/CRNA  Anesthesiologist: Windell Norfolk, MD Resident/CRNA: Franco Nones, CRNA Performed by: Franco Nones, CRNA Authorized by: Windell Norfolk, MD   Preanesthetic Checklist Completed: patient identified, IV checked, site marked, risks and benefits discussed, surgical consent, monitors and equipment checked, pre-op evaluation and timeout performed Spinal Block Patient position: right lateral decubitus Prep: Betadine Patient monitoring: heart rate, cardiac monitor, continuous pulse ox and blood pressure Approach: left paramedian Location: L3-4 Injection technique: single-shot Needle Needle type: Quincke  Needle gauge: 22 G Needle length: 12.7 cm Assessment Sensory level: T6 Events: CSF return Additional Notes Lot: [id number] GTIN: [id number] EXPires 2025-02-21 Skin localized with Lidocaine. Minerva Areola unsuccessful attempts with Pencan 24 g times 2 attempts Dr. Johnnette Litter successful as above.

## 2023-04-19 NOTE — Anesthesia Procedure Notes (Signed)
Date/Time: 04/19/2023 7:32 AM  Performed by: Franco Nones, CRNAPre-anesthesia Checklist: Patient identified, Emergency Drugs available, Suction available, Timeout performed and Patient being monitored Patient Re-evaluated:Patient Re-evaluated prior to induction Oxygen Delivery Method: Nasal Cannula

## 2023-04-19 NOTE — Interval H&P Note (Signed)
History and Physical Interval Note:  04/19/2023 7:17 AM  Kelly Fox  has presented today for surgery, with the diagnosis of osteoarthritis right knee.  The various methods of treatment have been discussed with the patient and family. After consideration of risks, benefits and other options for treatment, the patient has consented to  Procedure(s): TOTAL KNEE ARTHROPLASTY (Right) as a surgical intervention.  The patient's history has been reviewed, patient examined, no change in status, stable for surgery.  I have reviewed the patient's chart and labs.  Questions were answered to the patient's satisfaction.     Fuller Canada

## 2023-04-19 NOTE — Evaluation (Signed)
Physical Therapy Evaluation Patient Details Name: Kelly Fox MRN: [id number] DOB: [birth year] Today's Date: 04/19/2023   RIGHT KNEE ROM:  0 - 110 degrees AMBULATION DISTANCE: 120 feet using RW with Supervision    History of Present Illness  Kelly Fox is a 72 y/o female, s/p Right TKA on 04/19/23, with the diagnosis of osteoarthritis right knee.  Clinical Impression  Patient reviewed in and given written instructions for HEP with good carryover, demonstrates labored movement for sitting up at bedside with fair/good return for moving RLE, slightly labored cadence with fair/good return for right heel to toe stepping during gait training without loss of balance.  Patient tolerated sitting up in chair with RLE dangling and  spouse present after therapy.  Patient will benefit from continued skilled physical therapy in hospital and recommended venue below to increase strength, balance, endurance for safe ADLs and gait.         Recommendations for follow up therapy are one component of a multi-disciplinary discharge planning process, led by the attending physician.  Recommendations may be updated based on patient status, additional functional criteria and insurance authorization.  Follow Up Recommendations       Assistance Recommended at Discharge Set up Supervision/Assistance  Patient can return home with the following  A little help with walking and/or transfers;A little help with bathing/dressing/bathroom;Help with stairs or ramp for entrance;Assistance with cooking/housework    Equipment Recommendations None recommended by PT  Recommendations for Other Services       Functional Status Assessment Patient has had a recent decline in their functional status and demonstrates the ability to make significant improvements in function in a reasonable and predictable amount of time.     Precautions / Restrictions Precautions Precautions: Fall Restrictions Weight Bearing Restrictions:  Yes RLE Weight Bearing: Weight bearing as tolerated      Mobility  Bed Mobility Overal bed mobility: Needs Assistance Bed Mobility: Supine to Sit     Supine to sit: Supervision     General bed mobility comments: increased time, with fair/good return for moving RLE    Transfers Overall transfer level: Needs assistance Equipment used: Rolling walker (2 wheels) Transfers: Sit to/from Stand, Bed to chair/wheelchair/BSC Sit to Stand: Supervision   Step pivot transfers: Supervision       General transfer comment: increased time, labored movement    Ambulation/Gait Ambulation/Gait assistance: Supervision Gait Distance (Feet): 120 Feet Assistive device: Rolling walker (2 wheels) Gait Pattern/deviations: Decreased step length - right, Decreased step length - left, Decreased stride length, Decreased stance time - right, Antalgic Gait velocity: decreased     General Gait Details: slightly labored cadence with fair/good return for right heel to toe stepping without loss of balance, limited mostly due to c/o fatigue and right knee pain  Stairs            Wheelchair Mobility    Modified Rankin (Stroke Patients Only)       Balance Overall balance assessment: Needs assistance Sitting-balance support: Feet supported, No upper extremity supported Sitting balance-Leahy Scale: Good Sitting balance - Comments: seated at EOB   Standing balance support: During functional activity, Bilateral upper extremity supported Standing balance-Leahy Scale: Fair Standing balance comment: fair/good using RW                             Pertinent Vitals/Pain Pain Assessment Pain Assessment: 0-10 Pain Score: 8  Pain Location: right knee Pain Descriptors / Indicators: Sore, Discomfort Pain  Intervention(s): Limited activity within patient's tolerance, Monitored during session, Premedicated before session, Repositioned, Ice applied    Home Living Family/patient expects to  be discharged to:: Private residence Living Arrangements: Spouse/significant other Available Help at Discharge: Family;Available 24 hours/day Type of Home: House Home Access: Stairs to enter Entrance Stairs-Rails: Right;Left;Can reach both Entrance Stairs-Number of Steps: 4   Home Layout: One level Home Equipment: Agricultural consultant (2 wheels);BSC/3in1;Grab bars - tub/shower      Prior Function Prior Level of Function : Independent/Modified Independent;Driving             Mobility Comments: Tourist information centre manager without AD, drives ADLs Comments: Independent     Hand Dominance   Dominant Hand: Left    Extremity/Trunk Assessment   Upper Extremity Assessment Upper Extremity Assessment: Overall WFL for tasks assessed    Lower Extremity Assessment Lower Extremity Assessment: Overall WFL for tasks assessed;RLE deficits/detail RLE Deficits / Details: grossly -4/5 RLE: Unable to fully assess due to pain RLE Sensation: WNL RLE Coordination: WNL    Cervical / Trunk Assessment Cervical / Trunk Assessment: Normal  Communication   Communication: No difficulties  Cognition Arousal/Alertness: Awake/alert Behavior During Therapy: WFL for tasks assessed/performed Overall Cognitive Status: Within Functional Limits for tasks assessed                                          General Comments      Exercises Total Joint Exercises Ankle Circles/Pumps: Supine, 10 reps, Right, Strengthening, AROM Quad Sets: AROM, Strengthening, Right, 10 reps, Supine Short Arc Quad: AROM, Strengthening, Right, 10 reps, Supine Heel Slides: AROM, Strengthening, Right, 10 reps, Supine Goniometric ROM: Right knee: 0 - 110 degrees   Assessment/Plan    PT Assessment Patient needs continued PT services  PT Problem List Decreased strength;Decreased range of motion;Decreased activity tolerance;Decreased balance;Decreased mobility;Pain       PT Treatment Interventions DME instruction;Gait  training;Stair training;Functional mobility training;Therapeutic activities;Therapeutic exercise;Patient/family education;Balance training    PT Goals (Current goals can be found in the Care Plan section)  Acute Rehab PT Goals Patient Stated Goal: Return home with family to assist PT Goal Formulation: With patient/family Time For Goal Achievement: 04/21/23 Potential to Achieve Goals: Good    Frequency BID     Co-evaluation               AM-PAC PT "6 Clicks" Mobility  Outcome Measure Help needed turning from your back to your side while in a flat bed without using bedrails?: None Help needed moving from lying on your back to sitting on the side of a flat bed without using bedrails?: A Little Help needed moving to and from a bed to a chair (including a wheelchair)?: A Little Help needed standing up from a chair using your arms (e.g., wheelchair or bedside chair)?: A Little Help needed to walk in hospital room?: A Little Help needed climbing 3-5 steps with a railing? : A Little 6 Click Score: 19    End of Session   Activity Tolerance: Patient tolerated treatment well;Patient limited by fatigue;Patient limited by pain Patient left: in chair;with call bell/phone within reach Nurse Communication: Mobility status PT Visit Diagnosis: Unsteadiness on feet (R26.81);Other abnormalities of gait and mobility (R26.89);Muscle weakness (generalized) (M62.81)    Time: 1914-7829 PT Time Calculation (min) (ACUTE ONLY): 31 min   Charges:   PT Evaluation $PT Eval Moderate Complexity: 1 Mod PT  Treatments $Therapeutic Activity: 23-37 mins        2:51 PM, 04/19/23 Ocie Bob, MPT Physical Therapist with [facility] [place] [address] [phone] office [phone] mobile phone

## 2023-04-19 NOTE — Anesthesia Preprocedure Evaluation (Signed)
Anesthesia Evaluation  Patient identified by MRN, date of birth, ID band Patient awake    Reviewed: Allergy & Precautions, H&P , NPO status , Patient's Chart, lab work & pertinent test results, reviewed documented beta blocker date and time   Airway Mallampati: II  TM Distance: >3 FB Neck ROM: full    Dental no notable dental hx.    Pulmonary neg pulmonary ROS, asthma    Pulmonary exam normal breath sounds clear to auscultation       Cardiovascular Exercise Tolerance: Good negative cardio ROS  Rhythm:regular Rate:Normal     Neuro/Psych negative neurological ROS  negative psych ROS   GI/Hepatic negative GI ROS, Neg liver ROS,,,  Endo/Other  negative endocrine ROS    Renal/GU negative Renal ROS  negative genitourinary   Musculoskeletal   Abdominal   Peds  Hematology negative hematology ROS (+)   Anesthesia Other Findings   Reproductive/Obstetrics negative OB ROS                             Anesthesia Physical Anesthesia Plan  ASA: 2  Anesthesia Plan: General and Spinal   Post-op Pain Management: Regional block*   Induction:   PONV Risk Score and Plan:   Airway Management Planned:   Additional Equipment:   Intra-op Plan:   Post-operative Plan:   Informed Consent: I have reviewed the patients History and Physical, chart, labs and discussed the procedure including the risks, benefits and alternatives for the proposed anesthesia with the patient or authorized representative who has indicated his/her understanding and acceptance.     Dental Advisory Given  Plan Discussed with: CRNA  Anesthesia Plan Comments:        Anesthesia Quick Evaluation

## 2023-04-19 NOTE — Op Note (Signed)
Dictation for total knee replacement  04/19/2023  10:04 AM  Orthopaedic Surgery Operative Note (CSN: [id number])  Kelly Fox  02/08/1951 Date of Surgery: 04/19/2023   Diagnoses:  osteoarthritis right knee   Procedure: RIGHT Total knee arthroplasty   Operative Finding MEDIAL: GRADE 4  MEDIAL MENISCUS INTACT LATERAL GRADE2 LATERAL MENISCUS INTACT  PTF: 17 MM THICKNESS, GRADE 4  ACL PCL INTACT OSTEOPHYTES MINIMAL   Post-Op Diagnosis: Same Surgeons:Primary: Rylei Masella E, MD Assistants: CYNTHIA WRENN; ANGELA WHITT Location: [facility] OR ROOM 4 Anesthesia: SPINAL, POST OP NERVE BLOCK  Antibiotics: Ancef 2 g Tourniquet time:  Total Tourniquet Time Documented: Thigh (Right) - 92 minutes Total: Thigh (Right) - 92 minutes  Estimated Blood Loss: NONE Complications: None Specimens: None   Implants: Implant Name Type Inv. Item Serial No. Manufacturer Lot No. LRB No. Used Action  CEMENT HV SMART SET - [id number] Cement CEMENT HV SMART SET  [employer] [id number] Right 2 Implanted  INSERT TIB FIX BEARNG SZ 5 5MM - [id number] Insert INSERT TIB FIX BEARNG SZ 5 5MM  [employer] [id number] Right 1 Implanted  ATTUNE MED DOME PAT 32 KNEE - [id number] Knees ATTUNE MED DOME PAT 32 KNEE  [employer] [id number] Right 1 Implanted  ATTUNE PSFEM RTSZ5 NARCEM KNEE - [id number] Femur ATTUNE PSFEM RTSZ5 NARCEM KNEE  [employer] [id number] Right 1 Implanted  BASEPLATE TIB CMT FB PCKT SZ4 - [id number] Stem BASEPLATE TIB CMT FB PCKT SZ4  [employer] [id number] Right 1 Implanted     Indications for Surgery:   Kelly Fox is a 72 y.o. female who PRESENTS WITH DISABLING RIGHT KNEE PAIN .  Benefits and risks of operative and nonoperative management were discussed prior to surgery with patient/guardian(s) and informed consent form was completed.  While all risks cannot be anticipated, specific risks including infection, need for additional surgery, stiffness, postop pain,  infection, implant removal, loosening, infection requiring amputation, deep vein thrombosis, pulmonary embolus were discussed.   Procedure:   The patient was identified properly. Informed consent was obtained and the surgical site was marked. The patient was taken to the OR where SPINAL anesthesia was induced.  The patient was positioned SUPINE.  A Foley catheter was inserted, the right KNEE was prepped and draped in the usual sterile fashion.  Timeout was performed before the beginning of the case.      The operative limb,  was exsanguinated with a six-inch Esmarch and the tourniquet was inflated to 300 mmHg.  A straight midline incision was made over the right KNEE and taken down to the extensor mechanism. A medial arthrotomy was performed. The patella was everted and the patellofemoral soft tissue was released, along with the patellar fat pad.  The anterior cruciate ligament and PCL were resected.  The anterior horns of the lateral and medial meniscus were resected. The medial soft tissue sleeve was elevated to the mid coronal plane.  A three-eighths inch drill bit was used to enter the femoral canal which was decompressed with suction and irrigation until clear.   The distal femoral cutting guide was set for 9 mm distal resection,  5valgus alignment, for a right knee. The distal femur was resected and checked for flatness.  The resection was measured and only measured 8 mm therefore an additional 2 mm distal femoral cut was performed  The attune sizing femoral guide was placed and the femur was preliminarily sized to a size 5.   The external alignment guide for the tibial resection was then applied to   the distal and proximal tibia and set for anatomic slope along with 3 MM resection  from the deficient medial side.   Rotational alignment was set using the malleolus, the tibial tubercle and the tibial spines.  The proximal tibia was resected along with  residual menisci. The tibia was  sized using a base plate to a size 4.   The extension gap was checked.  5 mm spacer block fit well with good medial lateral stability   A 4-in-1 cutting block was placed along with collateral ligament retractors.  Our target was proper rotation based on the epicondylar axis using Whitesides line as a guide as well.  Once the block was pinned in place we took the spacer block that balanced extension gap and placed it on top of the tibia under the femoral block.  This fit was a loose.  I moved the block down 1.5 mm (once the block was moved down an angel wing was placed to check for notching prior to the actual cut).  There was no notching noted with the angel wing so the block was kept in the 1-1/2 mm down position  Once I was satisfied with the spacer block collateral ligament retractors were placed and I completed the 4 distal femoral cuts   The extension gap was rechecked with the same spacer block which was a size 5   The correct sized notch cutting guide for the femur was then applied and the notch cut was made.  Trial reduction was completed using size 5 femur 4 tibia 5 poly trial implants. Patella tracking was normal  We then skeletonized the patella. It measured 17 in thickness and the patellar resection was performed with the guide leaving 11 mm of patella  The patella diameter measured 32. We then drilled the peg holes for the patella.  Completed patellar thickness was 11+8.5  The proximal tibia was prepared using the size 4 base plate.  Thorough irrigation was performed using saline  and the bone was dried and prepared for cement. The cement was mixed on the back table using third generation preparation techniques  The implants were then cemented in place and excess cement was removed. The cement was allowed to cure. Surgipor irrigation was placed followed by saline irrigation  Any excess bone fragments and cement was removed.  The extensor mechanism was closed with #1 Bralon  suture followed by subcutaneous tissue closure using 0 Monocryl suture in 2 layers  Zynrelef a total of 2 vials were injected prior to complete extensor mechanism closure.  The openings and the capsule were then closed with #1 Braylon   Skin approximation was performed using staples  A sterile dressing was applied, TED hose were placed on the operative extremity followed by Cryo/Cuff.  The patient was taken recovery room in stable condition  Postop plan: Weightbearing as tolerated CPM machine Immediate physical therapy Discharge tomorrow if stable     

## 2023-04-19 NOTE — Plan of Care (Signed)
  Problem: Acute Rehab PT Goals(only PT should resolve) Goal: Pt Will Go Supine/Side To Sit Outcome: Progressing Flowsheets (Taken 04/19/2023 1452) Pt will go Supine/Side to Sit: with modified independence Goal: Patient Will Transfer Sit To/From Stand Outcome: Progressing Flowsheets (Taken 04/19/2023 1452) Patient will transfer sit to/from stand: with modified independence Goal: Pt Will Transfer Bed To Chair/Chair To Bed Outcome: Progressing Flowsheets (Taken 04/19/2023 1452) Pt will Transfer Bed to Chair/Chair to Bed: with modified independence Goal: Pt Will Ambulate Outcome: Progressing Flowsheets (Taken 04/19/2023 1452) Pt will Ambulate:  > 125 feet  with modified independence  with rolling walker   2:53 PM, 04/19/23 Ocie Bob, MPT Physical Therapist with [facility] [phone] office [phone] mobile phone

## 2023-04-19 NOTE — TOC Initial Note (Signed)
Transition of Care [facility]) - Initial/Assessment Note    Patient Details  Name: Kelly Fox MRN: [id number] Date of Birth: 02/23/1951  [facility] CM/SW Contact:    Villa Herb, LCSWA Phone Number: 04/19/2023, 2:59 PM  Clinical Narrative:                 CSW updated that pt will need HH at D/C. CSW spoke to Chapin with [facility] who confirms pts HH orders have been placed and HH has been prearranged by Dr. Mort Sawyers office. TOC to follow.   Expected Discharge Plan: Home w Home Health Services Barriers to Discharge: Continued Medical Work up   Patient Goals and CMS Choice Patient states their goals for this hospitalization and ongoing recovery are:: return home CMS Medicare.gov Compare Post Acute Care list provided to:: Patient Choice offered to / list presented to : Patient      Expected Discharge Plan and Services In-house Referral: Clinical Social Work Discharge Planning Services: CM Consult Post Acute Care Choice: Home Health Living arrangements for the past 2 months: Single Family Home                           HH Arranged: PT HH Agency: [facility] Date [facility] Agency Contacted: 04/19/23   Representative spoke with at [facility] Agency: Clifton Custard  Prior Living Arrangements/Services Living arrangements for the past 2 months: Single Family Home Lives with:: Spouse Patient language and need for interpreter reviewed:: Yes Do you feel safe going back to the place where you live?: Yes      Need for Family Participation in Patient Care: Yes (Comment) Care giver support system in place?: Yes (comment)   Criminal Activity/Legal Involvement Pertinent to Current Situation/Hospitalization: No - Comment as needed  Activities of Daily Living      Permission Sought/Granted                  Emotional Assessment Appearance:: Appears stated age Attitude/Demeanor/Rapport: Engaged Affect (typically observed): Accepting Orientation: : Oriented to Self,  Oriented to Place, Oriented to  Time, Oriented to Situation Alcohol / Substance Use: Not Applicable Psych Involvement: No (comment)  Admission diagnosis:  Primary localized osteoarthritis of right knee [M17.11] Patient Active Problem List   Diagnosis Date Noted   Primary osteoarthritis of right knee 04/19/2023   Primary localized osteoarthritis of right knee 04/19/2023   Status post total left knee replacement February 02, 2022 01/28/2023   Patellar clunk syndrome of left knee 10/29/2022   Osteoarthritis of left knee 02/02/2022   Unilateral primary osteoarthritis, left knee    Elevated blood-pressure reading without diagnosis of hypertension 12/03/2021   Seasonal allergic rhinitis 12/03/2021   PCP:  Benita Stabile, MD Pharmacy:   [phone] - [place], [place] - [address] AT [facility] [address] [place] [address] Phone: [phone] Fax: [phone]  [id number] - [place], [place] - [address] [facility] [address] [place] [address] Phone: [phone] Fax: [phone]     Social Determinants of Health (SDOH) Social History: SDOH Screenings   Tobacco Use: Low Risk  (04/19/2023)   SDOH Interventions:     Readmission Risk Interventions     No data to display

## 2023-04-20 DIAGNOSIS — J45909 Unspecified asthma, uncomplicated: Secondary | ICD-10-CM | POA: Diagnosis not present

## 2023-04-20 DIAGNOSIS — Z96652 Presence of left artificial knee joint: Secondary | ICD-10-CM | POA: Diagnosis not present

## 2023-04-20 DIAGNOSIS — Z96651 Presence of right artificial knee joint: Secondary | ICD-10-CM | POA: Diagnosis not present

## 2023-04-20 DIAGNOSIS — Z7982 Long term (current) use of aspirin: Secondary | ICD-10-CM | POA: Diagnosis not present

## 2023-04-20 DIAGNOSIS — M1711 Unilateral primary osteoarthritis, right knee: Secondary | ICD-10-CM | POA: Diagnosis not present

## 2023-04-20 DIAGNOSIS — Z79899 Other long term (current) drug therapy: Secondary | ICD-10-CM | POA: Diagnosis not present

## 2023-04-20 LAB — CBC
HCT: 27.5 % — ABNORMAL LOW (ref 36.0–46.0)
Hemoglobin: 8.5 g/dL — ABNORMAL LOW (ref 12.0–15.0)
MCH: 25.5 pg — ABNORMAL LOW (ref 26.0–34.0)
MCHC: 30.9 g/dL (ref 30.0–36.0)
MCV: 82.6 fL (ref 80.0–100.0)
Platelets: 158 10*3/uL (ref 150–400)
RBC: 3.33 MIL/uL — ABNORMAL LOW (ref 3.87–5.11)
RDW: 17.7 % — ABNORMAL HIGH (ref 11.5–15.5)
WBC: 7.2 10*3/uL (ref 4.0–10.5)
nRBC: 0 % (ref 0.0–0.2)

## 2023-04-20 LAB — BASIC METABOLIC PANEL
Anion gap: 5 (ref 5–15)
BUN: 14 mg/dL (ref 8–23)
CO2: 25 mmol/L (ref 22–32)
Calcium: 8.5 mg/dL — ABNORMAL LOW (ref 8.9–10.3)
Chloride: 104 mmol/L (ref 98–111)
Creatinine, Ser: 0.73 mg/dL (ref 0.44–1.00)
GFR, Estimated: >60 mL/min (ref >60)
Glucose, Bld: 129 mg/dL — ABNORMAL HIGH (ref 70–99)
Potassium: 4.5 mmol/L (ref 3.5–5.1)
Sodium: 134 mmol/L — ABNORMAL LOW (ref 135–145)

## 2023-04-20 MED ORDER — POLYETHYLENE GLYCOL 3350 17 G PO PACK: 17.0000 g | PACK | Freq: Every day | ORAL | 0 refills | Status: DC | PRN

## 2023-04-20 MED ORDER — CELECOXIB 200 MG PO CAPS: 200.0000 mg | ORAL_CAPSULE | Freq: Every day | ORAL | 0 refills | Status: DC

## 2023-04-20 MED ORDER — ASPIRIN 81 MG PO CHEW: 81.0000 mg | CHEWABLE_TABLET | Freq: Two times a day (BID) | ORAL | 1 refills | Status: DC

## 2023-04-20 MED ORDER — TRAMADOL HCL 50 MG PO TABS: 50.0000 mg | ORAL_TABLET | Freq: Four times a day (QID) | ORAL | 0 refills | Status: DC

## 2023-04-20 MED ORDER — HYDROCODONE-ACETAMINOPHEN 10-325 MG PO TABS: 1.0000 | ORAL_TABLET | ORAL | 0 refills | Status: DC | PRN

## 2023-04-20 MED ORDER — METHOCARBAMOL 500 MG PO TABS: 500.0000 mg | ORAL_TABLET | Freq: Four times a day (QID) | ORAL | 0 refills | Status: DC | PRN

## 2023-04-20 NOTE — Progress Notes (Signed)
Physical Therapy Treatment Patient Details Name: Kelly Fox MRN: [id number] DOB: 06-Apr-1951 Today's Date: 04/20/2023  RIGHT KNEE ROM:  0 - 118 degrees AMBULATION DISTANCE: 200 feet using RW with Modified Independence   History of Present Illness Kelly Fox is a 72 y/o female, s/p Right TKA on 04/19/23, with the diagnosis of osteoarthritis right knee.    PT Comments    Patient demonstrates good return for going up/down stairs in stairwell without loss of balance and understanding acknowledged, increased endurance/distance for gait training with fair/good carryover for right heel to to stepping and tolerated sitting up in chair with RLE dangling after therapy.  Patient will benefit from continued skilled physical therapy in hospital and recommended venue below to increase strength, balance, endurance for safe ADLs and gait.     Recommendations for follow up therapy are one component of a multi-disciplinary discharge planning process, led by the attending physician.  Recommendations may be updated based on patient status, additional functional criteria and insurance authorization.  Follow Up Recommendations       Assistance Recommended at Discharge Set up Supervision/Assistance  Patient can return home with the following A little help with walking and/or transfers;A little help with bathing/dressing/bathroom;Help with stairs or ramp for entrance;Assistance with cooking/housework   Equipment Recommendations  None recommended by PT    Recommendations for Other Services       Precautions / Restrictions Precautions Precautions: Fall Restrictions Weight Bearing Restrictions: Yes RLE Weight Bearing: Weight bearing as tolerated     Mobility  Bed Mobility Overal bed mobility: Modified Independent             General bed mobility comments: good return for moving RLE during bed mobility    Transfers Overall transfer level: Modified independent                  General transfer comment: good return for sit to stands, transfers without loss of balance    Ambulation/Gait Ambulation/Gait assistance: Modified independent (Device/Increase time) Gait Distance (Feet): 200 Feet Assistive device: Rolling walker (2 wheels) Gait Pattern/deviations: Decreased step length - right, Decreased step length - left, Decreased stride length, Decreased stance time - left Gait velocity: decreased     General Gait Details: increased endurance/distance for ambulation with fair/good return for right heel to toe stepping without loss of balance   Stairs Stairs: Yes Stairs assistance: Modified independent (Device/Increase time) Stair Management: Two rails, Step to pattern Number of Stairs: 4 General stair comments: good return for going up/down 4 steps in stairwell using bilateral side rails without loss of balance and understanding acknowledged   Wheelchair Mobility    Modified Rankin (Stroke Patients Only)       Balance Overall balance assessment: Needs assistance Sitting-balance support: Feet supported, No upper extremity supported Sitting balance-Leahy Scale: Good Sitting balance - Comments: seated at EOB   Standing balance support: During functional activity, Bilateral upper extremity supported Standing balance-Leahy Scale: Good Standing balance comment: using RW                            Cognition Arousal/Alertness: Awake/alert Behavior During Therapy: WFL for tasks assessed/performed Overall Cognitive Status: Within Functional Limits for tasks assessed                                          Exercises Total Joint Exercises  Ankle Circles/Pumps: Supine, 10 reps, Right, Strengthening, AROM Quad Sets: AROM, Strengthening, Right, 10 reps, Supine Short Arc Quad: AROM, Strengthening, Right, 10 reps, Supine Heel Slides: AROM, Strengthening, Right, 10 reps, Supine Goniometric ROM: Right Knee: 0 - 118 degrees     General Comments        Pertinent Vitals/Pain Pain Assessment Pain Assessment: 0-10 Pain Score: 4  Pain Location: right knee Pain Descriptors / Indicators: Sore, Discomfort Pain Intervention(s): Limited activity within patient's tolerance, Monitored during session, Repositioned    Home Living                          Prior Function            PT Goals (current goals can now be found in the care plan section) Acute Rehab PT Goals Patient Stated Goal: Return home with family to assist PT Goal Formulation: With patient/family Time For Goal Achievement: 04/21/23 Potential to Achieve Goals: Good Progress towards PT goals: Progressing toward goals    Frequency    BID      PT Plan Current plan remains appropriate    Co-evaluation              AM-PAC PT "6 Clicks" Mobility   Outcome Measure  Help needed turning from your back to your side while in a flat bed without using bedrails?: None Help needed moving from lying on your back to sitting on the side of a flat bed without using bedrails?: None Help needed moving to and from a bed to a chair (including a wheelchair)?: None Help needed standing up from a chair using your arms (e.g., wheelchair or bedside chair)?: None Help needed to walk in hospital room?: A Little Help needed climbing 3-5 steps with a railing? : A Little 6 Click Score: 22    End of Session   Activity Tolerance: Patient tolerated treatment well Patient left: in chair;with call bell/phone within reach Nurse Communication: Mobility status PT Visit Diagnosis: Unsteadiness on feet (R26.81);Other abnormalities of gait and mobility (R26.89);Muscle weakness (generalized) (M62.81)     Time: 0102-7253 PT Time Calculation (min) (ACUTE ONLY): 32 min  Charges:  $Gait Training: 8-22 mins $Therapeutic Exercise: 8-22 mins                     9:44 AM, 04/20/23 Ocie Bob, MPT Physical Therapist with [facility] 336  [phone] office [phone] mobile phone

## 2023-04-20 NOTE — Discharge Summary (Signed)
Physician Discharge Summary  Patient ID: Kelly Fox MRN: [id number] DOB/AGE: 1951/06/24 72 y.o.  Admit date: 04/19/2023 Discharge date: 04/20/2023  Admission Diagnoses: Primary osteoarthritis localized right knee  Discharge Diagnoses:  Principal Problem:   Primary localized osteoarthritis of right knee Active Problems:   Primary osteoarthritis of right knee   Discharged Condition: good  Hospital Course: Unremarkable hospital course.  Surgery on hospital day 1 April 19, 2023.  Right total knee arthroplasty.  Attune fixed-bearing posterior stabilized implant.  5 narrow femur 4 tibia with a 5 polyethylene insert and a 32 patella.  The patient ambulated 120 feet and had 100 degrees of motion on day 1  Postop day 2 the patient was in stable condition  Consults: None  Significant Diagnostic Studies: labs:     Latest Ref Rng & Units 04/20/2023    4:25 AM 04/13/2023    1:24 PM 10/26/2022    1:38 PM  CBC  WBC 4.0 - 10.5 K/uL 7.2  4.2  4.4   Hemoglobin 12.0 - 15.0 g/dL 8.5  28.4  13.2   Hematocrit 36.0 - 46.0 % 27.5  36.5  35.7   Platelets 150 - 400 K/uL 158  212  230        Latest Ref Rng & Units 04/20/2023    4:25 AM 04/13/2023    1:24 PM 10/26/2022    1:38 PM  BMP  Glucose 70 - 99 mg/dL 440  102  725   BUN 8 - 23 mg/dL 14  15  13    Creatinine 0.44 - 1.00 mg/dL 3.66  4.40  3.47   Sodium 135 - 145 mmol/L 134  137  137   Potassium 3.5 - 5.1 mmol/L 4.5  3.9  4.1   Chloride 98 - 111 mmol/L 104  103  105   CO2 22 - 32 mmol/L 25  27  26    Calcium 8.9 - 10.3 mg/dL 8.5  9.0  8.7        Discharge Exam: Blood pressure 103/60, pulse 65, temperature 97.7 F (36.5 C), temperature source Oral, resp. rate 18, height 5\' 2"  (1.575 m), weight 84.4 kg, SpO2 93 %. General appearance: alert, cooperative, and appears stated age Extremities: Homans sign is negative, no sign of DVT Pulses: 2+ and symmetric Neurologic: Grossly normal Incision/Wound: Dry send  Disposition: Discharge  disposition: 01-Home or Self Care       Discharge Instructions     Call MD / Call 911   Complete by: As directed    If you experience chest pain or shortness of breath, CALL 911 and be transported to the hospital emergency room.  If you develope a fever above 101 F, pus (white drainage) or increased drainage or redness at the wound, or calf pain, call your surgeon's office.   Constipation Prevention   Complete by: As directed    Drink plenty of fluids.  Prune juice may be helpful.  You may use a stool softener, such as Colace (over the counter) 100 mg twice a day.  Use MiraLax (over the counter) for constipation as needed.   Diet - low sodium heart healthy   Complete by: As directed    Discharge instructions   Complete by: As directed    CPM machine: 6 hours/day divide the time as you wish started 90 degrees increase 10 degrees/day until you reach the maximum amount approximately 120 degrees  Blue foam pillow: place the heel and the pillow and leave your leg straight for 30 minutes  3 times a day  Cryo/Cuff: Use the Cryo/Cuff ice cuff 6 times a day for 30 minutes  Dressing: Do not change the dressing unless bleeding soaks through the entire dressing.  Then you can change the dressing by placing 4 x 4's and tape over the incision  Shower: If you have enough assistance and a shower chair and someone to help you in and out of the shower you can take a shower with the dressing on it is waterproof  Driving: No driving for 4 weeks  Pain control:  Take the tramadol first for pain control.  Take the Celebrex.  If you are still having pain then take the hydrocodone.  Use ice to help with pain as well.   Increase activity slowly as tolerated   Complete by: As directed    Post-operative opioid taper instructions:   Complete by: As directed    POST-OPERATIVE OPIOID TAPER INSTRUCTIONS: It is important to wean off of your opioid medication as soon as possible. If you do not need pain  medication after your surgery it is ok to stop day one. Opioids include: Codeine, Hydrocodone(Norco, Vicodin), Oxycodone(Percocet, oxycontin) and hydromorphone amongst others.  Long term and even short term use of opiods can cause: Increased pain response Dependence Constipation Depression Respiratory depression And more.  Withdrawal symptoms can include Flu like symptoms Nausea, vomiting And more Techniques to manage these symptoms Hydrate well Eat regular healthy meals Stay active Use relaxation techniques(deep breathing, meditating, yoga) Do Not substitute Alcohol to help with tapering If you have been on opioids for less than two weeks and do not have pain than it is ok to stop all together.  Plan to wean off of opioids This plan should start within one week post op of your joint replacement. Maintain the same interval or time between taking each dose and first decrease the dose.  Cut the total daily intake of opioids by one tablet each day Next start to increase the time between doses. The last dose that should be eliminated is the evening dose.         Allergies as of 04/20/2023       Reactions   Contrast Media [iodinated Contrast Media] Swelling, Rash        Medication List     STOP taking these medications    aspirin EC 81 MG tablet Replaced by: aspirin 81 MG chewable tablet       TAKE these medications    albuterol 108 (90 Base) MCG/ACT inhaler Commonly known as: VENTOLIN HFA Inhale 2 puffs into the lungs every 6 (six) hours as needed for wheezing or shortness of breath.   aspirin 81 MG chewable tablet Chew 1 tablet (81 mg total) by mouth 2 (two) times daily. Replaces: aspirin EC 81 MG tablet   celecoxib 200 MG capsule Commonly known as: CELEBREX Take 1 capsule (200 mg total) by mouth daily. Start taking on: April 21, 2023   CENTRUM SILVER ADULT 50+ PO Take 1 tablet by mouth daily.   HYDROcodone-acetaminophen 10-325 MG tablet Commonly known  as: Norco Take 1 tablet by mouth every 4 (four) hours as needed.   methocarbamol 500 MG tablet Commonly known as: ROBAXIN Take 1 tablet (500 mg total) by mouth every 6 (six) hours as needed for muscle spasms.   polyethylene glycol 17 g packet Commonly known as: MIRALAX / GLYCOLAX Take 17 g by mouth daily as needed for mild constipation.   traMADol 50 MG tablet Commonly known as: Janean Sark  Take 1 tablet (50 mg total) by mouth every 6 (six) hours.         Signed: Fuller Canada 04/20/2023, 9:47 AM

## 2023-04-20 NOTE — Anesthesia Postprocedure Evaluation (Signed)
Anesthesia Post Note  Patient: Christal Lagerstrom  Procedure(s) Performed: TOTAL KNEE ARTHROPLASTY (Right: Knee)  Patient location during evaluation: Phase II Anesthesia Type: Spinal Level of consciousness: awake Pain management: pain level controlled Vital Signs Assessment: post-procedure vital signs reviewed and stable Respiratory status: spontaneous breathing and respiratory function stable Cardiovascular status: blood pressure returned to baseline and stable Postop Assessment: no headache and no apparent nausea or vomiting Anesthetic complications: no Comments: Late entry   No notable events documented.   Last Vitals:  Vitals:   04/19/23 2020 04/20/23 0332  BP: 106/81 103/60  Pulse: 63 65  Resp: 20 18  Temp: 36.5 C 36.5 C  SpO2: 94% 93%    Last Pain:  Vitals:   04/20/23 0600  TempSrc:   PainSc: 4                  Windell Norfolk

## 2023-04-20 NOTE — Progress Notes (Signed)
Patient ID: Kelly Fox, female   DOB: 1951-01-03, 72 y.o.   MRN: [id number] Postoperative day 1 status post right total knee  Diagnosis osteoarthritis right knee  The patient is doing well  Her range of motion was 0 to 100 degrees with 120 feet gait yesterday  She is neurovascular intact today she is awake and alert she is pretty much ready to go home       Latest Ref Rng & Units 04/20/2023    4:25 AM 04/13/2023    1:24 PM 10/26/2022    1:38 PM  CBC  WBC 4.0 - 10.5 K/uL 7.2  4.2  4.4   Hemoglobin 12.0 - 15.0 g/dL 8.5  24.4  01.0   Hematocrit 36.0 - 46.0 % 27.5  36.5  35.7   Platelets 150 - 400 K/uL 158  212  230        Latest Ref Rng & Units 04/20/2023    4:25 AM 04/13/2023    1:24 PM 10/26/2022    1:38 PM  BMP  Glucose 70 - 99 mg/dL 272  536  644   BUN 8 - 23 mg/dL 14  15  13    Creatinine 0.44 - 1.00 mg/dL 0.34  7.42  5.95   Sodium 135 - 145 mmol/L 134  137  137   Potassium 3.5 - 5.1 mmol/L 4.5  3.9  4.1   Chloride 98 - 111 mmol/L 104  103  105   CO2 22 - 32 mmol/L 25  27  26    Calcium 8.9 - 10.3 mg/dL 8.5  9.0  8.7

## 2023-04-20 NOTE — Care Management Obs Status (Signed)
MEDICARE OBSERVATION STATUS NOTIFICATION   Patient Details  Name: Janica Eldred MRN: [id number] Date of Birth: 21-Aug-1951   Medicare Observation Status Notification Given:  Yes    Corey Harold 04/20/2023, 11:05 AM

## 2023-04-20 NOTE — Progress Notes (Signed)
Patient urinated after foley removed.

## 2023-04-21 ENCOUNTER — Telehealth: Payer: Self-pay | Admitting: Orthopedic Surgery

## 2023-04-21 DIAGNOSIS — Z471 Aftercare following joint replacement surgery: Secondary | ICD-10-CM | POA: Diagnosis not present

## 2023-04-21 DIAGNOSIS — Z9181 History of falling: Secondary | ICD-10-CM | POA: Diagnosis not present

## 2023-04-21 DIAGNOSIS — I10 Essential (primary) hypertension: Secondary | ICD-10-CM | POA: Diagnosis not present

## 2023-04-21 DIAGNOSIS — K5900 Constipation, unspecified: Secondary | ICD-10-CM | POA: Diagnosis not present

## 2023-04-21 DIAGNOSIS — Z96651 Presence of right artificial knee joint: Secondary | ICD-10-CM | POA: Diagnosis not present

## 2023-04-21 DIAGNOSIS — Z7982 Long term (current) use of aspirin: Secondary | ICD-10-CM | POA: Diagnosis not present

## 2023-04-21 DIAGNOSIS — Z96652 Presence of left artificial knee joint: Secondary | ICD-10-CM | POA: Diagnosis not present

## 2023-04-21 DIAGNOSIS — J45909 Unspecified asthma, uncomplicated: Secondary | ICD-10-CM | POA: Diagnosis not present

## 2023-04-21 NOTE — Telephone Encounter (Signed)
Kelly Fox   Patient called and wanted to know when is her post op.  I look in her chart and did not see anything.  I know you normally like 14 days.   Please advise.

## 2023-04-21 NOTE — Telephone Encounter (Signed)
Mardene Sayer with [facility] called and needs Dr. Romeo Apple for approval on one of his patients Kelly Fox 20-Dec-2050.  He needs approval for PT orders for the following   2 week one 3 week one 2 week one   Call him back at [phone]

## 2023-04-22 ENCOUNTER — Telehealth: Payer: Self-pay | Admitting: Orthopedic Surgery

## 2023-04-22 NOTE — Addendum Note (Signed)
Addendum  created 04/22/23 1355 by Franco Nones, CRNA   Clinical Note Signed, Intraprocedure Event edited, SmartForm saved

## 2023-04-22 NOTE — Telephone Encounter (Signed)
Called with verbal orders.  

## 2023-04-22 NOTE — Telephone Encounter (Addendum)
Patients husband called yesterday left message Kathie Rhodes pulled message off today so I called her back) she was having a lot of trouble lifting her leg after surgery, feels like the muscle is weak after surgery I encouraged her to continue trying to lift the leg and to ice her thigh as well as her knee since its painful. She also states she feels like she is not able to bear as much weight as she wants to on the leg either but will continue to try.   I made her a post op appointment  Told her to let us know  early next week if this does not improve quickly with continued icing and trying to use the leg as much as possible   To you FYI

## 2023-04-23 DIAGNOSIS — Z7982 Long term (current) use of aspirin: Secondary | ICD-10-CM | POA: Diagnosis not present

## 2023-04-23 DIAGNOSIS — J45909 Unspecified asthma, uncomplicated: Secondary | ICD-10-CM | POA: Diagnosis not present

## 2023-04-23 DIAGNOSIS — I10 Essential (primary) hypertension: Secondary | ICD-10-CM | POA: Diagnosis not present

## 2023-04-23 DIAGNOSIS — Z96651 Presence of right artificial knee joint: Secondary | ICD-10-CM | POA: Diagnosis not present

## 2023-04-23 DIAGNOSIS — K5900 Constipation, unspecified: Secondary | ICD-10-CM | POA: Diagnosis not present

## 2023-04-23 DIAGNOSIS — Z471 Aftercare following joint replacement surgery: Secondary | ICD-10-CM | POA: Diagnosis not present

## 2023-04-23 DIAGNOSIS — Z9181 History of falling: Secondary | ICD-10-CM | POA: Diagnosis not present

## 2023-04-23 DIAGNOSIS — Z96652 Presence of left artificial knee joint: Secondary | ICD-10-CM | POA: Diagnosis not present

## 2023-04-23 LAB — TYPE AND SCREEN
ABO/RH(D): A POS
Unit division: 0
Unit division: 0

## 2023-04-23 LAB — BPAM RBC
Blood Product Expiration Date: 202407172359
Unit Type and Rh: 6200
Unit Type and Rh: 6200

## 2023-04-25 ENCOUNTER — Encounter (HOSPITAL_COMMUNITY): Payer: Self-pay | Admitting: Orthopedic Surgery

## 2023-04-25 ENCOUNTER — Ambulatory Visit (INDEPENDENT_AMBULATORY_CARE_PROVIDER_SITE_OTHER): Payer: Medicare Other | Admitting: Orthopedic Surgery

## 2023-04-25 DIAGNOSIS — Z96651 Presence of right artificial knee joint: Secondary | ICD-10-CM

## 2023-04-25 DIAGNOSIS — I10 Essential (primary) hypertension: Secondary | ICD-10-CM | POA: Diagnosis not present

## 2023-04-25 DIAGNOSIS — Z96652 Presence of left artificial knee joint: Secondary | ICD-10-CM | POA: Diagnosis not present

## 2023-04-25 DIAGNOSIS — K5900 Constipation, unspecified: Secondary | ICD-10-CM | POA: Diagnosis not present

## 2023-04-25 DIAGNOSIS — J45909 Unspecified asthma, uncomplicated: Secondary | ICD-10-CM | POA: Diagnosis not present

## 2023-04-25 DIAGNOSIS — Z9181 History of falling: Secondary | ICD-10-CM | POA: Diagnosis not present

## 2023-04-25 DIAGNOSIS — Z7982 Long term (current) use of aspirin: Secondary | ICD-10-CM | POA: Diagnosis not present

## 2023-04-25 DIAGNOSIS — M1711 Unilateral primary osteoarthritis, right knee: Secondary | ICD-10-CM

## 2023-04-25 DIAGNOSIS — Z471 Aftercare following joint replacement surgery: Secondary | ICD-10-CM | POA: Diagnosis not present

## 2023-04-25 NOTE — Progress Notes (Signed)
Chief Complaint  Patient presents with   Routine Post Op    Post of site draining and needing new bandage    POST OP: DOS 04/19/23  Diagnoses:  osteoarthritis right knee    Procedure: RIGHT Total knee arthroplasty/  ATTUNE FB/PS    Operative Finding MEDIAL: GRADE 4  MEDIAL MENISCUS INTACT LATERAL GRADE2 LATERAL MENISCUS INTACT  PTF: 17 MM THICKNESS, GRADE 4  ACL PCL INTACT OSTEOPHYTES MINIMAL   Wound check.  Dress change.  Wound looks great.  She does have some bruising around the thigh.  Dressing change as stated  Patient says she feels much better after the therapist was so much the leg  Follow-up as scheduled

## 2023-04-27 DIAGNOSIS — Z471 Aftercare following joint replacement surgery: Secondary | ICD-10-CM | POA: Diagnosis not present

## 2023-04-27 DIAGNOSIS — Z96652 Presence of left artificial knee joint: Secondary | ICD-10-CM | POA: Diagnosis not present

## 2023-04-27 DIAGNOSIS — Z96651 Presence of right artificial knee joint: Secondary | ICD-10-CM | POA: Diagnosis not present

## 2023-04-27 DIAGNOSIS — J45909 Unspecified asthma, uncomplicated: Secondary | ICD-10-CM | POA: Diagnosis not present

## 2023-04-27 DIAGNOSIS — I10 Essential (primary) hypertension: Secondary | ICD-10-CM | POA: Diagnosis not present

## 2023-04-27 DIAGNOSIS — K5900 Constipation, unspecified: Secondary | ICD-10-CM | POA: Diagnosis not present

## 2023-04-27 DIAGNOSIS — Z9181 History of falling: Secondary | ICD-10-CM | POA: Diagnosis not present

## 2023-04-27 DIAGNOSIS — Z7982 Long term (current) use of aspirin: Secondary | ICD-10-CM | POA: Diagnosis not present

## 2023-04-29 DIAGNOSIS — Z471 Aftercare following joint replacement surgery: Secondary | ICD-10-CM | POA: Diagnosis not present

## 2023-04-29 DIAGNOSIS — Z96651 Presence of right artificial knee joint: Secondary | ICD-10-CM | POA: Diagnosis not present

## 2023-04-29 DIAGNOSIS — K5900 Constipation, unspecified: Secondary | ICD-10-CM | POA: Diagnosis not present

## 2023-04-29 DIAGNOSIS — J45909 Unspecified asthma, uncomplicated: Secondary | ICD-10-CM | POA: Diagnosis not present

## 2023-04-29 DIAGNOSIS — I10 Essential (primary) hypertension: Secondary | ICD-10-CM | POA: Diagnosis not present

## 2023-04-29 DIAGNOSIS — Z7982 Long term (current) use of aspirin: Secondary | ICD-10-CM | POA: Diagnosis not present

## 2023-04-29 DIAGNOSIS — Z96652 Presence of left artificial knee joint: Secondary | ICD-10-CM | POA: Diagnosis not present

## 2023-04-29 DIAGNOSIS — Z9181 History of falling: Secondary | ICD-10-CM | POA: Diagnosis not present

## 2023-05-02 DIAGNOSIS — Z471 Aftercare following joint replacement surgery: Secondary | ICD-10-CM | POA: Diagnosis not present

## 2023-05-02 DIAGNOSIS — K5900 Constipation, unspecified: Secondary | ICD-10-CM | POA: Diagnosis not present

## 2023-05-02 DIAGNOSIS — Z96651 Presence of right artificial knee joint: Secondary | ICD-10-CM | POA: Diagnosis not present

## 2023-05-02 DIAGNOSIS — Z7982 Long term (current) use of aspirin: Secondary | ICD-10-CM | POA: Diagnosis not present

## 2023-05-02 DIAGNOSIS — Z96652 Presence of left artificial knee joint: Secondary | ICD-10-CM | POA: Diagnosis not present

## 2023-05-02 DIAGNOSIS — J45909 Unspecified asthma, uncomplicated: Secondary | ICD-10-CM | POA: Diagnosis not present

## 2023-05-02 DIAGNOSIS — I10 Essential (primary) hypertension: Secondary | ICD-10-CM | POA: Diagnosis not present

## 2023-05-02 DIAGNOSIS — Z9181 History of falling: Secondary | ICD-10-CM | POA: Diagnosis not present

## 2023-05-03 DIAGNOSIS — I10 Essential (primary) hypertension: Secondary | ICD-10-CM | POA: Diagnosis not present

## 2023-05-03 DIAGNOSIS — K5900 Constipation, unspecified: Secondary | ICD-10-CM | POA: Diagnosis not present

## 2023-05-03 DIAGNOSIS — Z7982 Long term (current) use of aspirin: Secondary | ICD-10-CM | POA: Diagnosis not present

## 2023-05-03 DIAGNOSIS — Z96651 Presence of right artificial knee joint: Secondary | ICD-10-CM | POA: Diagnosis not present

## 2023-05-03 DIAGNOSIS — Z96652 Presence of left artificial knee joint: Secondary | ICD-10-CM | POA: Diagnosis not present

## 2023-05-03 DIAGNOSIS — J45909 Unspecified asthma, uncomplicated: Secondary | ICD-10-CM | POA: Diagnosis not present

## 2023-05-03 DIAGNOSIS — Z9181 History of falling: Secondary | ICD-10-CM | POA: Diagnosis not present

## 2023-05-03 DIAGNOSIS — Z471 Aftercare following joint replacement surgery: Secondary | ICD-10-CM | POA: Diagnosis not present

## 2023-05-04 ENCOUNTER — Ambulatory Visit (HOSPITAL_COMMUNITY): Payer: Medicare Other | Attending: Orthopedic Surgery

## 2023-05-04 ENCOUNTER — Other Ambulatory Visit: Payer: Self-pay

## 2023-05-04 DIAGNOSIS — G8929 Other chronic pain: Secondary | ICD-10-CM | POA: Insufficient documentation

## 2023-05-04 DIAGNOSIS — M1711 Unilateral primary osteoarthritis, right knee: Secondary | ICD-10-CM | POA: Insufficient documentation

## 2023-05-04 DIAGNOSIS — M25561 Pain in right knee: Secondary | ICD-10-CM | POA: Diagnosis not present

## 2023-05-04 DIAGNOSIS — R262 Difficulty in walking, not elsewhere classified: Secondary | ICD-10-CM | POA: Diagnosis not present

## 2023-05-04 DIAGNOSIS — M25661 Stiffness of right knee, not elsewhere classified: Secondary | ICD-10-CM | POA: Diagnosis not present

## 2023-05-04 NOTE — Therapy (Signed)
OUTPATIENT PHYSICAL THERAPY LOWER EXTREMITY EVALUATION   Patient Name: Kelly Fox MRN: [id number] DOB:[birth year], 72 y.o., female Today's Date: 05/04/2023  END OF SESSION:  PT End of Session - 05/04/23 0816     Visit Number 1    Number of Visits 8    Date for PT Re-Evaluation 06/01/23    Authorization Type [employer] Medicare    Progress Note Due on Visit 10    PT Start Time 0815    PT Stop Time 0900    PT Time Calculation (min) 45 min    Activity Tolerance Patient tolerated treatment well    Behavior During Therapy [facility] for tasks assessed/performed             Past Medical History:  Diagnosis Date   Asthma    Past Surgical History:  Procedure Laterality Date   DENTAL SURGERY     KNEE ARTHROSCOPY Left 10/29/2022   Procedure: ARTHROSCOPY KNEE LEFT FOR SCAR TISSUE DEBRIDEMENT;  Surgeon: Vickki Hearing, MD;  Location: [facility];  Service: Orthopedics;  Laterality: Left;   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/02/2022   Procedure: TOTAL KNEE ARTHROPLASTY;  Surgeon: Vickki Hearing, MD;  Location: [facility];  Service: Orthopedics;  Laterality: Left;   TOTAL KNEE ARTHROPLASTY Right 04/19/2023   Procedure: TOTAL KNEE ARTHROPLASTY;  Surgeon: Vickki Hearing, MD;  Location: [facility];  Service: Orthopedics;  Laterality: Right;   Patient Active Problem List   Diagnosis Date Noted   Primary osteoarthritis of right knee 04/19/2023   Primary localized osteoarthritis of right knee 04/19/2023   Status post total left knee replacement February 02, 2022 01/28/2023   Patellar clunk syndrome of left knee 10/29/2022   Osteoarthritis of left knee 02/02/2022   Unilateral primary osteoarthritis, left knee    Elevated blood-pressure reading without diagnosis of hypertension 12/03/2021   Seasonal allergic rhinitis 12/03/2021    PCP: Nita Sells, MD  REFERRING PROVIDER: Vickki Hearing, MD  REFERRING DIAG: M17.11 (ICD-10-CM) - Primary osteoarthritis of right knee  THERAPY DIAG:  Stiffness of  right knee, not elsewhere classified - Plan: PT plan of care cert/re-cert  Chronic pain of right knee - Plan: PT plan of care cert/re-cert  Difficulty in walking, not elsewhere classified - Plan: PT plan of care cert/re-cert  Osteoarthritis of right knee, unspecified osteoarthritis type - Plan: PT plan of care cert/re-cert  Rationale for Evaluation and Treatment: Rehabilitation  ONSET DATE: 04/19/23  SUBJECTIVE:   SUBJECTIVE STATEMENT: Patient with right knee OA for long time; had left knee done April of last year; s/p R TKA 04/18/23 per Dr. Romeo Apple at [facility]. Had home health up until yesterday.  CPM  PERTINENT HISTORY: Left knee TKA April 23 PAIN:  Are you having pain? Yes: NPRS scale: 6/10 Pain location: right knee Pain description: really sore Aggravating factors: being still, bending Relieving factors: massage, pain meds  PRECAUTIONS: None  WEIGHT BEARING RESTRICTIONS: No  FALLS:  Has patient fallen in last 6 months? No  OCCUPATION: retired  PLOF: Independent  PATIENT GOALS: walk straight without a limp or pain  NEXT MD VISIT: 7/11 staple removal   OBJECTIVE:   DIAGNOSTIC FINDINGS:  CLINICAL DATA:  Status post right total knee replacement   EXAM: PORTABLE RIGHT KNEE - 1-2 VIEW   COMPARISON:  03/28/2023   FINDINGS: Changes of right knee replacement. No hardware or bony complicating feature. Soft tissue and joint space gas noted. Anterior skin staples.   IMPRESSION: Right knee replacement.  No visible complicating feature.  PATIENT SURVEYS:  FOTO 50  COGNITION: Overall cognitive status: Within functional limits for tasks assessed     SENSATION: WFL  EDEMA:  Yes normal for this time s/p; noted bruising right lower leg especially   PALPATION: General soreness right knee  LOWER EXTREMITY ROM:  Active ROM Right eval Left eval  Hip flexion    Hip extension    Hip abduction    Hip adduction    Hip internal rotation    Hip external  rotation    Knee flexion 100   Knee extension -16   Ankle dorsiflexion    Ankle plantarflexion    Ankle inversion    Ankle eversion     (Blank rows = not tested)  LOWER EXTREMITY MMT:  MMT Right eval Left eval  Hip flexion 3-   Hip extension    Hip abduction    Hip adduction    Hip internal rotation    Hip external rotation    Knee flexion    Knee extension 2+   Ankle dorsiflexion 3+   Ankle plantarflexion    Ankle inversion    Ankle eversion     (Blank rows = not tested)  FUNCTIONAL TESTS:  5 times sit to stand: 20.55 sec with UE assist 2 minute walk test: 178 ft with RW  GAIT: Distance walked: 178 ft Assistive device utilized: Walker - 2 wheeled Level of assistance: SBA Comments: antalgic gait; decreased stance right LE   TODAY'S TREATMENT:                                                                                                                              DATE: 05/04/23 physical therapy evaluation and HEP instruction    PATIENT EDUCATION:  Education details: Patient educated on exam findings, POC, scope of PT, HEP, and what to expect next visit. Person educated: Patient Education method: Explanation, Demonstration, and Handouts Education comprehension: verbalized understanding, returned demonstration, verbal cues required, and tactile cues required  HOME EXERCISE PROGRAM: Access Code: [email or web address] URL: [email or web address] Date: 05/04/2023 Prepared by: [facility]  Exercises - Supine Ankle Pumps  - 2 x daily - 7 x weekly - 2 sets - 15 reps - Supine Quadricep Sets  - 2 x daily - 7 x weekly - 1 sets - 10 reps - 5 sec hold - Supine Short Arc Quad  - 2 x daily - 7 x weekly - 1 sets - 15 reps - Supine Heel Slides  - 2 x daily - 7 x weekly - 1 sets - 15 reps - Seated Long Arc Quad  - 2 x daily - 7 x weekly - 1 sets - 15 reps - Seated Knee Flexion Stretch  - 2 x daily - 7 x weekly - 1 sets - 15 reps - Seated Heel Toe Raises  - 2 x daily - 7 x  weekly - 1 sets - 10 reps - Seated March  - 2 x  daily - 7 x weekly - 2 sets - 10 reps  Patient Education - Total Knee Replacement Handout  ASSESSMENT:  CLINICAL IMPRESSION: Patient is a 72 y.o. female who was seen today for physical therapy evaluation and treatment for M17.11 (ICD-10-CM) - Primary osteoarthritis of right knee.  Patient demonstrates muscle weakness, reduced ROM, and fascial restrictions which are likely contributing to symptoms of pain and are negatively impacting patient ability to perform ADLs and functional mobility tasks. Patient will benefit from skilled physical therapy services to address these deficits to reduce pain and improve level of function with ADLs and functional mobility tasks.   OBJECTIVE IMPAIRMENTS: Abnormal gait, decreased activity tolerance, decreased endurance, decreased mobility, difficulty walking, decreased ROM, decreased strength, hypomobility, increased edema, increased fascial restrictions, impaired perceived functional ability, impaired flexibility, and pain.   ACTIVITY LIMITATIONS: carrying, lifting, bending, sitting, standing, squatting, sleeping, stairs, transfers, bed mobility, and locomotion level  PARTICIPATION LIMITATIONS: meal prep, cleaning, laundry, driving, shopping, community activity, and yard work  [facility] POTENTIAL: Good  CLINICAL DECISION MAKING: Stable/uncomplicated  EVALUATION COMPLEXITY: Low   GOALS: Goals reviewed with patient? No  SHORT TERM GOALS: Target date: 05/18/2023 patient will be independent with initial HEP  Baseline: Goal status: INITIAL  2.  Patient will self report 30% improvement to improve tolerance for functional activity  Baseline:  Goal status: INITIAL   LONG TERM GOALS: Target date: 06/01/2023  Patient will be independent in self management strategies to improve quality of life and functional outcomes.  Baseline:  Goal status: INITIAL  2.  Patient will self report 50% improvement to  improve tolerance for functional activity  Baseline:  Goal status: INITIAL  3.  Patient will increase right knee mobility to -5 to 120 to promote normal navigation of steps; step over step pattern  Baseline:  Goal status: INITIAL  4.  Patient will increase right leg MMTs to 5/5 without pain to promote return to ambulation community distances with minimal deviation.  Baseline:  Goal status: INITIAL  5.  Patient will increase distance on to 225 ft with LRAD  to demonstrate improved functional mobility walking household and community distances.   Baseline: 178 ft Goal status: INITIAL  6.  Patient will improve 5 times sit to stand score from 20.55 sec to 15 sec without UE assist  to demonstrate improved functional mobility and increased lower extremity strength.  Baseline:  Goal status: INITIAL  7.  Patient will improve FOTO by 10 points  Baseline: 50  Goal status: initial   PLAN:  PT FREQUENCY: 2x/week  PT DURATION: 4 weeks  PLANNED INTERVENTIONS: Therapeutic exercises, Therapeutic activity, Neuromuscular re-education, Balance training, Gait training, Patient/Family education, Joint manipulation, Joint mobilization, Stair training, Orthotic/Fit training, DME instructions, Aquatic Therapy, Dry Needling, Electrical stimulation, Spinal manipulation, Spinal mobilization, Cryotherapy, Moist heat, Compression bandaging, scar mobilization, Splintting, Taping, Traction, Ultrasound, Ionotophoresis 4mg /ml Dexamethasone, and Manual therapy   PLAN FOR NEXT SESSION: Review HEP and goals; progress right knee mobility and strength as able; gait, balance as appropriate   9:02 AM, 05/04/23 Kelly Fox MPT [place] [facility] [place] [phone] Ph:[phone]

## 2023-05-05 ENCOUNTER — Ambulatory Visit (INDEPENDENT_AMBULATORY_CARE_PROVIDER_SITE_OTHER): Payer: Medicare Other | Admitting: Orthopedic Surgery

## 2023-05-05 DIAGNOSIS — Z96651 Presence of right artificial knee joint: Secondary | ICD-10-CM

## 2023-05-05 DIAGNOSIS — M1711 Unilateral primary osteoarthritis, right knee: Secondary | ICD-10-CM

## 2023-05-05 NOTE — Progress Notes (Signed)
Chief Complaint  Patient presents with   Routine Post Op    Post op staples removed    04/19/23/ DOS   Encounter Diagnoses  Name Primary?   Primary osteoarthritis of right knee Yes   Status post total right knee replacement April 19, 2023      RT TKA   Kelly Fox is progressing slowly with this knee she did much better with the left knee  She is not using her pillow for extension and she is developing a flexion contracture and hamstring contracture as well  She flexes the knee fine she does have some swelling still.  She was only icing at the end of the day  I recommend that she ice it 4-6 times a day use her pillow for extension 3 times to 4 times a day for half an hour  She will continue with outpatient therapy follow-up with me in 3 to 4 weeks

## 2023-05-10 ENCOUNTER — Ambulatory Visit (INDEPENDENT_AMBULATORY_CARE_PROVIDER_SITE_OTHER): Payer: Medicare Other | Admitting: Radiology

## 2023-05-10 ENCOUNTER — Ambulatory Visit (HOSPITAL_COMMUNITY): Payer: Medicare Other

## 2023-05-10 ENCOUNTER — Encounter (HOSPITAL_COMMUNITY): Payer: Self-pay

## 2023-05-10 DIAGNOSIS — M25661 Stiffness of right knee, not elsewhere classified: Secondary | ICD-10-CM

## 2023-05-10 DIAGNOSIS — M1711 Unilateral primary osteoarthritis, right knee: Secondary | ICD-10-CM | POA: Diagnosis not present

## 2023-05-10 DIAGNOSIS — Z96651 Presence of right artificial knee joint: Secondary | ICD-10-CM

## 2023-05-10 DIAGNOSIS — G8929 Other chronic pain: Secondary | ICD-10-CM

## 2023-05-10 DIAGNOSIS — R262 Difficulty in walking, not elsewhere classified: Secondary | ICD-10-CM | POA: Diagnosis not present

## 2023-05-10 DIAGNOSIS — M25561 Pain in right knee: Secondary | ICD-10-CM | POA: Diagnosis not present

## 2023-05-10 NOTE — Progress Notes (Signed)
Single retained staple removed from patients knee incision Patient tolerated well will follow up as scheduled

## 2023-05-10 NOTE — Therapy (Signed)
OUTPATIENT PHYSICAL THERAPY LOWER EXTREMITY TREATMENT   Patient Name: Kelly Fox MRN: [id number] DOB:Jun 29, 1951, 72 y.o., female Today's Date: 05/10/2023  END OF SESSION:  PT End of Session - 05/10/23 1130     Visit Number 2    Number of Visits 8    Date for PT Re-Evaluation 06/01/23    Authorization Type [employer]    Progress Note Due on Visit 10    PT Start Time 1038    PT Stop Time 1130    PT Time Calculation (min) 52 min    Activity Tolerance Patient tolerated treatment well    Behavior During Therapy [facility] for tasks assessed/performed              Past Medical History:  Diagnosis Date   Asthma    Past Surgical History:  Procedure Laterality Date   DENTAL SURGERY     KNEE ARTHROSCOPY Left 10/29/2022   Procedure: ARTHROSCOPY KNEE LEFT FOR SCAR TISSUE DEBRIDEMENT;  Surgeon: Vickki Hearing, MD;  Location: [facility];  Service: Orthopedics;  Laterality: Left;   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/02/2022   Procedure: TOTAL KNEE ARTHROPLASTY;  Surgeon: Vickki Hearing, MD;  Location: [facility];  Service: Orthopedics;  Laterality: Left;   TOTAL KNEE ARTHROPLASTY Right 04/19/2023   Procedure: TOTAL KNEE ARTHROPLASTY;  Surgeon: Vickki Hearing, MD;  Location: [facility];  Service: Orthopedics;  Laterality: Right;   Patient Active Problem List   Diagnosis Date Noted   Primary osteoarthritis of right knee 04/19/2023   Primary localized osteoarthritis of right knee 04/19/2023   Status post total left knee replacement February 02, 2022 01/28/2023   Patellar clunk syndrome of left knee 10/29/2022   Osteoarthritis of left knee 02/02/2022   Unilateral primary osteoarthritis, left knee    Elevated blood-pressure reading without diagnosis of hypertension 12/03/2021   Seasonal allergic rhinitis 12/03/2021    PCP: Nita Sells, MD  REFERRING PROVIDER: Vickki Hearing, MD  REFERRING DIAG: M17.11 (ICD-10-CM) - Primary osteoarthritis of right knee  THERAPY DIAG:  Stiffness of  right knee, not elsewhere classified  Chronic pain of right knee  Difficulty in walking, not elsewhere classified  Osteoarthritis of right knee, unspecified osteoarthritis type  Rationale for Evaluation and Treatment: Rehabilitation  ONSET DATE: 04/19/23  SUBJECTIVE:   SUBJECTIVE STATEMENT: 05/10/23:  Reports MD apt last Thursday 05/05/23 and had staples removed though reports she found another staple- has apt later today.    Eval:  Patient with right knee OA for long time; had left knee done April of last year; s/p R TKA 04/18/23 per Dr. Romeo Apple at [facility]. Had home health up until yesterday.  CPM  PERTINENT HISTORY: Left knee TKA April 23 PAIN:  Are you having pain? Yes: NPRS scale: 5/10 Pain location: right knee Pain description: really sore Aggravating factors: being still, bending Relieving factors: massage, pain meds  PRECAUTIONS: None  WEIGHT BEARING RESTRICTIONS: No  FALLS:  Has patient fallen in last 6 months? No  OCCUPATION: retired  PLOF: Independent  PATIENT GOALS: walk straight without a limp or pain  NEXT MD VISIT: 7/11 staple removal   OBJECTIVE:   DIAGNOSTIC FINDINGS:  CLINICAL DATA:  Status post right total knee replacement   EXAM: PORTABLE RIGHT KNEE - 1-2 VIEW   COMPARISON:  03/28/2023   FINDINGS: Changes of right knee replacement. No hardware or bony complicating feature. Soft tissue and joint space gas noted. Anterior skin staples.   IMPRESSION: Right knee replacement.  No visible complicating feature.  PATIENT SURVEYS:  FOTO 50  COGNITION: Overall cognitive status: Within functional limits for tasks assessed     SENSATION: WFL  EDEMA:  Yes normal for this time s/p; noted bruising right lower leg especially   PALPATION: General soreness right knee  LOWER EXTREMITY ROM:  Active ROM Right eval Left eval 05/09/23  Hip flexion     Hip extension     Hip abduction     Hip adduction     Hip internal rotation     Hip  external rotation     Knee flexion 100  108  Knee extension -16  8  Ankle dorsiflexion     Ankle plantarflexion     Ankle inversion     Ankle eversion      (Blank rows = not tested)  LOWER EXTREMITY MMT:  MMT Right eval Left eval  Hip flexion 3-   Hip extension    Hip abduction    Hip adduction    Hip internal rotation    Hip external rotation    Knee flexion    Knee extension 2+   Ankle dorsiflexion 3+   Ankle plantarflexion    Ankle inversion    Ankle eversion     (Blank rows = not tested)  FUNCTIONAL TESTS:  5 times sit to stand: 20.55 sec with UE assist 2 minute walk test: 178 ft with RW  GAIT: Distance walked: 178 ft Assistive device utilized: Walker - 2 wheeled Level of assistance: SBA Comments: antalgic gait; decreased stance right LE   TODAY'S TREATMENT:                                                                                                                              DATE:  05/09/23: Reviewed goals Educated importance of HEP Upon examination- may want to request additional steristrips due to openings on incision.  Returns to MD later today.    Supine: Quad sets 2x 10 Heel slides 10 SAQ 15x 5" SLR 10x -quad sets prior with some extension lag  Seated:  LAQ 2 x10 STS 10x  Standing: Heel and toe raises 10x  TKE with GTB 10x 5" Knee drive on 40JW step 5x 10"   05/04/23 physical therapy evaluation and HEP instruction    PATIENT EDUCATION:  Education details: Patient educated on exam findings, POC, scope of PT, HEP, and what to expect next visit. Person educated: Patient Education method: Explanation, Demonstration, and Handouts Education comprehension: verbalized understanding, returned demonstration, verbal cues required, and tactile cues required  HOME EXERCISE PROGRAM: Access Code: [email or web address] URL: [email or web address] Date: 05/04/2023 Prepared by: [facility]  Exercises - Supine Ankle Pumps  - 2 x daily - 7 x  weekly - 2 sets - 15 reps - Supine Quadricep Sets  - 2 x daily - 7 x weekly - 1 sets - 10 reps - 5 sec hold - Supine Short Arc Quad  - 2 x daily - 7 x  weekly - 1 sets - 15 reps - Supine Heel Slides  - 2 x daily - 7 x weekly - 1 sets - 15 reps - Seated Long Arc Quad  - 2 x daily - 7 x weekly - 1 sets - 15 reps - Seated Knee Flexion Stretch  - 2 x daily - 7 x weekly - 1 sets - 15 reps - Seated Heel Toe Raises  - 2 x daily - 7 x weekly - 1 sets - 10 reps - Seated March  - 2 x daily - 7 x weekly - 2 sets - 10 reps  Patient Education - Total Knee Replacement Handout  05/09/23:  STS ASSESSMENT:  CLINICAL IMPRESSION: 05/09/23:  Reviewed goals, educated importance of HEP compliance for maximal benefits.  Session focus with quad strengthening and knee mobility.  Min cueing to improve distal quad activation, improved with SAQ and tactile cueing. AROM 8-108 degrees.  Reviewed RICE techniques for edema and pain control.  Discussed benefits of compression garments as well as pt spoke of past trauma to LE and varicose veins present.  Pt had staples removed last Thursday and has found 1 staple with plans to have removed later today.  Noted distal incision is open, encouraged to discuss possible additional steri strips with MD.  Eval:  Patient is a 72 y.o. female who was seen today for physical therapy evaluation and treatment for M17.11 (ICD-10-CM) - Primary osteoarthritis of right knee.  Patient demonstrates muscle weakness, reduced ROM, and fascial restrictions which are likely contributing to symptoms of pain and are negatively impacting patient ability to perform ADLs and functional mobility tasks. Patient will benefit from skilled physical therapy services to address these deficits to reduce pain and improve level of function with ADLs and functional mobility tasks.   OBJECTIVE IMPAIRMENTS: Abnormal gait, decreased activity tolerance, decreased endurance, decreased mobility, difficulty walking, decreased  ROM, decreased strength, hypomobility, increased edema, increased fascial restrictions, impaired perceived functional ability, impaired flexibility, and pain.   ACTIVITY LIMITATIONS: carrying, lifting, bending, sitting, standing, squatting, sleeping, stairs, transfers, bed mobility, and locomotion level  PARTICIPATION LIMITATIONS: meal prep, cleaning, laundry, driving, shopping, community activity, and yard work  [facility] POTENTIAL: Good  CLINICAL DECISION MAKING: Stable/uncomplicated  EVALUATION COMPLEXITY: Low   GOALS: Goals reviewed with patient? No  SHORT TERM GOALS: Target date: 05/18/2023 patient will be independent with initial HEP  Baseline: Goal status: IN PROGRESS  2.  Patient will self report 30% improvement to improve tolerance for functional activity  Baseline:  Goal status: IN PROGRESS   LONG TERM GOALS: Target date: 06/01/2023  Patient will be independent in self management strategies to improve quality of life and functional outcomes.  Baseline:  Goal status: IN PROGRESS  2.  Patient will self report 50% improvement to improve tolerance for functional activity  Baseline:  Goal status: IN PROGRESS  3.  Patient will increase right knee mobility to -5 to 120 to promote normal navigation of steps; step over step pattern  Baseline:  Goal status: IN PROGRESS  4.  Patient will increase right leg MMTs to 5/5 without pain to promote return to ambulation community distances with minimal deviation.  Baseline:  Goal status: IN PROGRESS  5.  Patient will increase distance on to 225 ft with LRAD  to demonstrate improved functional mobility walking household and community distances.   Baseline: 178 ft Goal status: IN PROGRESS  6.  Patient will improve 5 times sit to stand score from 20.55 sec to 15  sec without UE assist  to demonstrate improved functional mobility and increased lower extremity strength.  Baseline:  Goal status: IN PROGRESS  7.  Patient  will improve FOTO by 10 points  Baseline: 50  Goal status: IN PROGRESS   PLAN:  PT FREQUENCY: 2x/week  PT DURATION: 4 weeks  PLANNED INTERVENTIONS: Therapeutic exercises, Therapeutic activity, Neuromuscular re-education, Balance training, Gait training, Patient/Family education, Joint manipulation, Joint mobilization, Stair training, Orthotic/Fit training, DME instructions, Aquatic Therapy, Dry Needling, Electrical stimulation, Spinal manipulation, Spinal mobilization, Cryotherapy, Moist heat, Compression bandaging, scar mobilization, Splintting, Taping, Traction, Ultrasound, Ionotophoresis 4mg /ml Dexamethasone, and Manual therapy   PLAN FOR NEXT SESSION: Review HEP and goals; progress right knee mobility and strength as able; gait, balance as appropriate  Becky Sax, LPTA/CLT; CBIS [phone]  Juel Burrow, PTA 05/10/2023, 1:02 PM  1:02 PM, 05/10/23

## 2023-05-12 ENCOUNTER — Encounter (HOSPITAL_COMMUNITY): Payer: Self-pay

## 2023-05-12 ENCOUNTER — Ambulatory Visit (HOSPITAL_COMMUNITY): Payer: Medicare Other

## 2023-05-12 DIAGNOSIS — M25561 Pain in right knee: Secondary | ICD-10-CM | POA: Diagnosis not present

## 2023-05-12 DIAGNOSIS — M1711 Unilateral primary osteoarthritis, right knee: Secondary | ICD-10-CM

## 2023-05-12 DIAGNOSIS — R262 Difficulty in walking, not elsewhere classified: Secondary | ICD-10-CM

## 2023-05-12 DIAGNOSIS — G8929 Other chronic pain: Secondary | ICD-10-CM

## 2023-05-12 DIAGNOSIS — M25661 Stiffness of right knee, not elsewhere classified: Secondary | ICD-10-CM | POA: Diagnosis not present

## 2023-05-12 NOTE — Therapy (Signed)
OUTPATIENT PHYSICAL THERAPY LOWER EXTREMITY TREATMENT   Patient Name: Kelly Fox MRN: [id number] DOB:Mar 07, 1951, 72 y.o., female Today's Date: 05/12/2023  END OF SESSION:  PT End of Session - 05/12/23 1350     Visit Number 3    Number of Visits 8    Date for PT Re-Evaluation 06/01/23    Authorization Type [employer] Medicare    Progress Note Due on Visit 10    PT Start Time 1350    PT Stop Time 1429    PT Time Calculation (min) 39 min    Activity Tolerance Patient tolerated treatment well    Behavior During Therapy [facility] for tasks assessed/performed              Past Medical History:  Diagnosis Date   Asthma    Past Surgical History:  Procedure Laterality Date   DENTAL SURGERY     KNEE ARTHROSCOPY Left 10/29/2022   Procedure: ARTHROSCOPY KNEE LEFT FOR SCAR TISSUE DEBRIDEMENT;  Surgeon: Vickki Hearing, MD;  Location: [facility];  Service: Orthopedics;  Laterality: Left;   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/02/2022   Procedure: TOTAL KNEE ARTHROPLASTY;  Surgeon: Vickki Hearing, MD;  Location: [facility];  Service: Orthopedics;  Laterality: Left;   TOTAL KNEE ARTHROPLASTY Right 04/19/2023   Procedure: TOTAL KNEE ARTHROPLASTY;  Surgeon: Vickki Hearing, MD;  Location: [facility];  Service: Orthopedics;  Laterality: Right;   Patient Active Problem List   Diagnosis Date Noted   Primary osteoarthritis of right knee 04/19/2023   Primary localized osteoarthritis of right knee 04/19/2023   Status post total left knee replacement February 02, 2022 01/28/2023   Patellar clunk syndrome of left knee 10/29/2022   Osteoarthritis of left knee 02/02/2022   Unilateral primary osteoarthritis, left knee    Elevated blood-pressure reading without diagnosis of hypertension 12/03/2021   Seasonal allergic rhinitis 12/03/2021    PCP: Nita Sells, MD  REFERRING PROVIDER: Vickki Hearing, MD  REFERRING DIAG: M17.11 (ICD-10-CM) - Primary osteoarthritis of right knee  THERAPY DIAG:  Stiffness of  right knee, not elsewhere classified  Chronic pain of right knee  Difficulty in walking, not elsewhere classified  Osteoarthritis of right knee, unspecified osteoarthritis type  Rationale for Evaluation and Treatment: Rehabilitation  ONSET DATE: 04/19/23  SUBJECTIVE:   SUBJECTIVE STATEMENT: 05/12/23:  Reports she did a lot of walking yesterday and has been compliant with HEP.  Went to MD's off on the 16th and had the additional staple removed  .  Eval:  Patient with right knee OA for long time; had left knee done April of last year; s/p R TKA 04/18/23 per Dr. Romeo Apple at [facility]. Had home health up until yesterday.  CPM  PERTINENT HISTORY: Left knee TKA April 23 PAIN:  Are you having pain? Yes: NPRS scale: 5/10 Pain location: right knee Pain description: really sore Aggravating factors: being still, bending Relieving factors: massage, pain meds  PRECAUTIONS: None  WEIGHT BEARING RESTRICTIONS: No  FALLS:  Has patient fallen in last 6 months? No  OCCUPATION: retired  PLOF: Independent  PATIENT GOALS: walk straight without a limp or pain  NEXT MD VISIT: 7/11 staple removal   OBJECTIVE:   DIAGNOSTIC FINDINGS:  CLINICAL DATA:  Status post right total knee replacement   EXAM: PORTABLE RIGHT KNEE - 1-2 VIEW   COMPARISON:  03/28/2023   FINDINGS: Changes of right knee replacement. No hardware or bony complicating feature. Soft tissue and joint space gas noted. Anterior skin staples.   IMPRESSION:  Right knee replacement.  No visible complicating feature.    PATIENT SURVEYS:  FOTO 50  COGNITION: Overall cognitive status: Within functional limits for tasks assessed     SENSATION: WFL  EDEMA:  Yes normal for this time s/p; noted bruising right lower leg especially   PALPATION: General soreness right knee  LOWER EXTREMITY ROM:  Active ROM Right eval Left eval 05/09/23  Hip flexion     Hip extension     Hip abduction     Hip adduction     Hip internal  rotation     Hip external rotation     Knee flexion 100  108  Knee extension -16  8  Ankle dorsiflexion     Ankle plantarflexion     Ankle inversion     Ankle eversion      (Blank rows = not tested)  LOWER EXTREMITY MMT:  MMT Right eval Left eval  Hip flexion 3-   Hip extension    Hip abduction    Hip adduction    Hip internal rotation    Hip external rotation    Knee flexion    Knee extension 2+   Ankle dorsiflexion 3+   Ankle plantarflexion    Ankle inversion    Ankle eversion     (Blank rows = not tested)  FUNCTIONAL TESTS:  5 times sit to stand: 20.55 sec with UE assist 2 minute walk test: 178 ft with RW  GAIT: Distance walked: 178 ft Assistive device utilized: Environmental consultant - 2 wheeled Level of assistance: SBA Comments: antalgic gait; decreased stance right LE   TODAY'S TREATMENT:                                                                                                                              DATE:  05/12/23: Bike seat 11 full revolution x5 min Standing:  Knee drive 5x 10" on 2nd step  Heel raise 15x  Toe 10x  GTB TKE 10x 5"  Slant board 3x 30" Prone:  Knee hang x 1'  TKE 10x 5"  Hamstring curls 10x Supine: quad sets 10x 5"  Heel slides 10x  AROM 6-112 degrees STS  05/09/23: Reviewed goals Educated importance of HEP Upon examination- may want to request additional steristrips due to openings on incision.  Returns to MD later today.    Supine: Quad sets 2x 10 Heel slides 10 SAQ 15x 5" SLR 10x -quad sets prior with some extension lag  Seated:  LAQ 2 x10 STS 10x  Standing: Heel and toe raises 10x  TKE with GTB 10x 5" Knee drive on 78GN step 5x 10"   05/04/23 physical therapy evaluation and HEP instruction    PATIENT EDUCATION:  Education details: Patient educated on exam findings, POC, scope of PT, HEP, and what to expect next visit. Person educated: Patient Education method: Explanation, Demonstration, and Handouts Education  comprehension: verbalized understanding, returned demonstration, verbal cues required, and tactile cues  required  HOME EXERCISE PROGRAM: Access Code: [email or web address] URL: [email or web address] Date: 05/04/2023 Prepared by: [facility] - [facility]  Exercises - Supine Ankle Pumps  - 2 x daily - 7 x weekly - 2 sets - 15 reps - Supine Quadricep Sets  - 2 x daily - 7 x weekly - 1 sets - 10 reps - 5 sec hold - Supine Short Arc Quad  - 2 x daily - 7 x weekly - 1 sets - 15 reps - Supine Heel Slides  - 2 x daily - 7 x weekly - 1 sets - 15 reps - Seated Long Arc Quad  - 2 x daily - 7 x weekly - 1 sets - 15 reps - Seated Knee Flexion Stretch  - 2 x daily - 7 x weekly - 1 sets - 15 reps - Seated Heel Toe Raises  - 2 x daily - 7 x weekly - 1 sets - 10 reps - Seated March  - 2 x daily - 7 x weekly - 2 sets - 10 reps  Patient Education - Total Knee Replacement Handout  05/09/23:  STS ASSESSMENT:  CLINICAL IMPRESSION: 05/12/23:  Session focus with knee mobility and quad strengthening.  Began session on bike with ability to complete full revolution seat 11.  Added prone knee hang and TKE based exercises for quad strengthening.  Pt tolerated well with session, no reports of pain through session. AROM improved 6-112 degrees.  Eval:  Patient is a 72 y.o. female who was seen today for physical therapy evaluation and treatment for M17.11 (ICD-10-CM) - Primary osteoarthritis of right knee.  Patient demonstrates muscle weakness, reduced ROM, and fascial restrictions which are likely contributing to symptoms of pain and are negatively impacting patient ability to perform ADLs and functional mobility tasks. Patient will benefit from skilled physical therapy services to address these deficits to reduce pain and improve level of function with ADLs and functional mobility tasks.   OBJECTIVE IMPAIRMENTS: Abnormal gait, decreased activity tolerance, decreased endurance, decreased mobility, difficulty walking, decreased ROM,  decreased strength, hypomobility, increased edema, increased fascial restrictions, impaired perceived functional ability, impaired flexibility, and pain.   ACTIVITY LIMITATIONS: carrying, lifting, bending, sitting, standing, squatting, sleeping, stairs, transfers, bed mobility, and locomotion level  PARTICIPATION LIMITATIONS: meal prep, cleaning, laundry, driving, shopping, community activity, and yard work  [facility] POTENTIAL: Good  CLINICAL DECISION MAKING: Stable/uncomplicated  EVALUATION COMPLEXITY: Low   GOALS: Goals reviewed with patient? No  SHORT TERM GOALS: Target date: 05/18/2023 patient will be independent with initial HEP  Baseline: Goal status: IN PROGRESS  2.  Patient will self report 30% improvement to improve tolerance for functional activity  Baseline:  Goal status: IN PROGRESS   LONG TERM GOALS: Target date: 06/01/2023  Patient will be independent in self management strategies to improve quality of life and functional outcomes.  Baseline:  Goal status: IN PROGRESS  2.  Patient will self report 50% improvement to improve tolerance for functional activity  Baseline:  Goal status: IN PROGRESS  3.  Patient will increase right knee mobility to -5 to 120 to promote normal navigation of steps; step over step pattern  Baseline:  Goal status: IN PROGRESS  4.  Patient will increase right leg MMTs to 5/5 without pain to promote return to ambulation community distances with minimal deviation.  Baseline:  Goal status: IN PROGRESS  5.  Patient will increase distance on to 225 ft with LRAD  to demonstrate improved functional mobility walking household and community distances.  Baseline: 178 ft Goal status: IN PROGRESS  6.  Patient will improve 5 times sit to stand score from 20.55 sec to 15 sec without UE assist  to demonstrate improved functional mobility and increased lower extremity strength.  Baseline:  Goal status: IN PROGRESS  7.  Patient will  improve FOTO by 10 points  Baseline: 50  Goal status: IN PROGRESS   PLAN:  PT FREQUENCY: 2x/week  PT DURATION: 4 weeks  PLANNED INTERVENTIONS: Therapeutic exercises, Therapeutic activity, Neuromuscular re-education, Balance training, Gait training, Patient/Family education, Joint manipulation, Joint mobilization, Stair training, Orthotic/Fit training, DME instructions, Aquatic Therapy, Dry Needling, Electrical stimulation, Spinal manipulation, Spinal mobilization, Cryotherapy, Moist heat, Compression bandaging, scar mobilization, Splintting, Taping, Traction, Ultrasound, Ionotophoresis 4mg /ml Dexamethasone, and Manual therapy   PLAN FOR NEXT SESSION: Progress right knee mobility and strength as able; gait, balance as appropriate  Becky Sax, LPTA/CLT; CBIS [phone]  Juel Burrow, PTA 05/12/2023, 2:30 PM  2:30 PM, 05/12/23

## 2023-05-15 ENCOUNTER — Other Ambulatory Visit: Payer: Self-pay | Admitting: Orthopedic Surgery

## 2023-05-16 ENCOUNTER — Ambulatory Visit (HOSPITAL_COMMUNITY): Payer: Medicare Other | Admitting: Physical Therapy

## 2023-05-16 DIAGNOSIS — G8929 Other chronic pain: Secondary | ICD-10-CM

## 2023-05-16 DIAGNOSIS — M1711 Unilateral primary osteoarthritis, right knee: Secondary | ICD-10-CM

## 2023-05-16 DIAGNOSIS — M25661 Stiffness of right knee, not elsewhere classified: Secondary | ICD-10-CM | POA: Diagnosis not present

## 2023-05-16 DIAGNOSIS — R262 Difficulty in walking, not elsewhere classified: Secondary | ICD-10-CM | POA: Diagnosis not present

## 2023-05-16 DIAGNOSIS — M25561 Pain in right knee: Secondary | ICD-10-CM | POA: Diagnosis not present

## 2023-05-16 NOTE — Therapy (Signed)
OUTPATIENT PHYSICAL THERAPY LOWER EXTREMITY TREATMENT   Patient Name: Kelly Fox MRN: [id number] DOB:[birth year], 72 y.o., female Today's Date: 05/16/2023  END OF SESSION:  PT End of Session - 05/16/23 1427     Visit Number 4    Number of Visits 8    Date for PT Re-Evaluation 06/01/23    Authorization Type [employer] Medicare    Progress Note Due on Visit 10    PT Start Time 1350    PT Stop Time 1438    PT Time Calculation (min) 48 min    Activity Tolerance Patient tolerated treatment well    Behavior During Therapy [facility] for tasks assessed/performed               Past Medical History:  Diagnosis Date   Asthma    Past Surgical History:  Procedure Laterality Date   DENTAL SURGERY     KNEE ARTHROSCOPY Left 10/29/2022   Procedure: ARTHROSCOPY KNEE LEFT FOR SCAR TISSUE DEBRIDEMENT;  Surgeon: Vickki Hearing, MD;  Location: [facility];  Service: Orthopedics;  Laterality: Left;   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/02/2022   Procedure: TOTAL KNEE ARTHROPLASTY;  Surgeon: Vickki Hearing, MD;  Location: [facility];  Service: Orthopedics;  Laterality: Left;   TOTAL KNEE ARTHROPLASTY Right 04/19/2023   Procedure: TOTAL KNEE ARTHROPLASTY;  Surgeon: Vickki Hearing, MD;  Location: [facility];  Service: Orthopedics;  Laterality: Right;   Patient Active Problem List   Diagnosis Date Noted   Primary osteoarthritis of right knee 04/19/2023   Primary localized osteoarthritis of right knee 04/19/2023   Status post total left knee replacement February 02, 2022 01/28/2023   Patellar clunk syndrome of left knee 10/29/2022   Osteoarthritis of left knee 02/02/2022   Unilateral primary osteoarthritis, left knee    Elevated blood-pressure reading without diagnosis of hypertension 12/03/2021   Seasonal allergic rhinitis 12/03/2021    PCP: Nita Sells, MD  REFERRING PROVIDER: Vickki Hearing, MD  REFERRING DIAG: M17.11 (ICD-10-CM) - Primary osteoarthritis of right knee  THERAPY DIAG:  Stiffness of  right knee, not elsewhere classified  Chronic pain of right knee  Difficulty in walking, not elsewhere classified  Osteoarthritis of right knee, unspecified osteoarthritis type  Rationale for Evaluation and Treatment: Rehabilitation  ONSET DATE: 04/19/23  SUBJECTIVE:   SUBJECTIVE STATEMENT: Pt continues to use RW with ambulation and with antalgia.  Reports the incision is all healed up now and doing well overall except for pain.  Currently 4/10 pain Rt knee. States she is using the blue foam to help with straightening.  Eval:  Patient with right knee OA for long time; had left knee done April of last year; s/p R TKA 04/18/23 per Dr. Romeo Apple at [facility]. Had home health up until yesterday.  CPM  PERTINENT HISTORY: Left knee TKA April 23 PAIN:  Are you having pain? Yes: NPRS scale: 4/10 Pain location: right knee Pain description: really sore Aggravating factors: being still, bending Relieving factors: massage, pain meds  PRECAUTIONS: None  WEIGHT BEARING RESTRICTIONS: No  FALLS:  Has patient fallen in last 6 months? No  OCCUPATION: retired  PLOF: Independent  PATIENT GOALS: walk straight without a limp or pain  NEXT MD VISIT: 7/11 staple removal   OBJECTIVE:   DIAGNOSTIC FINDINGS:  CLINICAL DATA:  Status post right total knee replacement   EXAM: PORTABLE RIGHT KNEE - 1-2 VIEW   COMPARISON:  03/28/2023   FINDINGS: Changes of right knee replacement. No hardware or bony complicating feature. Soft  tissue and joint space gas noted. Anterior skin staples.   IMPRESSION: Right knee replacement.  No visible complicating feature.    PATIENT SURVEYS:  FOTO 50  COGNITION: Overall cognitive status: Within functional limits for tasks assessed     SENSATION: WFL  EDEMA:  Yes normal for this time s/p; noted bruising right lower leg especially   PALPATION: General soreness right knee  LOWER EXTREMITY ROM:  Active ROM Right eval Left eval 05/09/23   Hip flexion       Hip extension      Hip abduction      Hip adduction      Hip internal rotation      Hip external rotation      Knee flexion 100  108   Knee extension -16  8   Ankle dorsiflexion      Ankle plantarflexion      Ankle inversion      Ankle eversion       (Blank rows = not tested)  LOWER EXTREMITY MMT:  MMT Right eval Left eval  Hip flexion 3-   Hip extension    Hip abduction    Hip adduction    Hip internal rotation    Hip external rotation    Knee flexion    Knee extension 2+   Ankle dorsiflexion 3+   Ankle plantarflexion    Ankle inversion    Ankle eversion     (Blank rows = not tested)  FUNCTIONAL TESTS:  5 times sit to stand: 20.55 sec with UE assist 2 minute walk test: 178 ft with RW  GAIT: Distance walked: 178 ft Assistive device utilized: Environmental consultant - 2 wheeled Level of assistance: SBA Comments: antalgic gait; decreased stance right LE   TODAY'S TREATMENT:                                                                                                                              DATE:  05/16/23: Bike seat 10 full revolution x5 min Standing: Knee drive 16X09" on 12" step  Heel raise 15x  Slant board 3x 30" Supine: quad sets 10x 5"  SAQ 10X10"  Heel slides 10x  Rt LE SLR 10X Prone: Knee hang x 5 with soft tissue massage   05/12/23: Bike seat 11 full revolution x5 min Standing:  Knee drive 5x 10" on 2nd step  Heel raise 15x  Toe 10x  GTB TKE 10x 5"  Slant board 3x 30" Prone:  Knee hang x 1'  TKE 10x 5"  Hamstring curls 10x Supine: quad sets 10x 5"  Heel slides 10x  AROM 6-112 degrees STS  05/09/23: Reviewed goals Educated importance of HEP Upon examination- may want to request additional steristrips due to openings on incision.  Returns to MD later today.    Supine: Quad sets 2x 10 Heel slides 10 SAQ 15x 5" SLR 10x -quad sets prior with some extension lag  Seated:  LAQ 2 x10  STS 10x  Standing: Heel and toe raises 10x  TKE with  GTB 10x 5" Knee drive on 16XW step 5x 10"   05/04/23 physical therapy evaluation and HEP instruction    PATIENT EDUCATION:  Education details: Patient educated on exam findings, POC, scope of PT, HEP, and what to expect next visit. Person educated: Patient Education method: Explanation, Demonstration, and Handouts Education comprehension: verbalized understanding, returned demonstration, verbal cues required, and tactile cues required  HOME EXERCISE PROGRAM: Access Code: [email or web address] URL: [email or web address] Date: 05/04/2023 Prepared by: [facility]  Exercises - Supine Ankle Pumps  - 2 x daily - 7 x weekly - 2 sets - 15 reps - Supine Quadricep Sets  - 2 x daily - 7 x weekly - 1 sets - 10 reps - 5 sec hold - Supine Short Arc Quad  - 2 x daily - 7 x weekly - 1 sets - 15 reps - Supine Heel Slides  - 2 x daily - 7 x weekly - 1 sets - 15 reps - Seated Long Arc Quad  - 2 x daily - 7 x weekly - 1 sets - 15 reps - Seated Knee Flexion Stretch  - 2 x daily - 7 x weekly - 1 sets - 15 reps - Seated Heel Toe Raises  - 2 x daily - 7 x weekly - 1 sets - 10 reps - Seated March  - 2 x daily - 7 x weekly - 2 sets - 10 reps  Patient Education - Total Knee Replacement Handout  05/09/23:  STS ASSESSMENT:  CLINICAL IMPRESSION: Focused on knee mobility and reduction of antalgia with gait.  Began with ROM on bike with seat moved up one level to increase stretch.  Pt required minimal cues for form, however some needed for posture with activity.  Soft tissue massage/Myofascial release completed to posterior knee with prone knee hang.  Pt reported knee feeling much better following therex and manual.  No reports of pain through session. Pt will continue to benefit from skilled therapy to reduce deficits.   Eval:  Patient is a 72 y.o. female who was seen today for physical therapy evaluation and treatment for M17.11 (ICD-10-CM) - Primary osteoarthritis of right knee.  Patient demonstrates muscle  weakness, reduced ROM, and fascial restrictions which are likely contributing to symptoms of pain and are negatively impacting patient ability to perform ADLs and functional mobility tasks. Patient will benefit from skilled physical therapy services to address these deficits to reduce pain and improve level of function with ADLs and functional mobility tasks.   OBJECTIVE IMPAIRMENTS: Abnormal gait, decreased activity tolerance, decreased endurance, decreased mobility, difficulty walking, decreased ROM, decreased strength, hypomobility, increased edema, increased fascial restrictions, impaired perceived functional ability, impaired flexibility, and pain.   ACTIVITY LIMITATIONS: carrying, lifting, bending, sitting, standing, squatting, sleeping, stairs, transfers, bed mobility, and locomotion level  PARTICIPATION LIMITATIONS: meal prep, cleaning, laundry, driving, shopping, community activity, and yard work  [facility] POTENTIAL: Good  CLINICAL DECISION MAKING: Stable/uncomplicated  EVALUATION COMPLEXITY: Low   GOALS: Goals reviewed with patient? No  SHORT TERM GOALS: Target date: 05/18/2023 patient will be independent with initial HEP  Baseline: Goal status: IN PROGRESS  2.  Patient will self report 30% improvement to improve tolerance for functional activity  Baseline:  Goal status: IN PROGRESS   LONG TERM GOALS: Target date: 06/01/2023  Patient will be independent in self management strategies to improve quality of life and functional outcomes.  Baseline:  Goal status: IN PROGRESS  2.  Patient will self report 50% improvement to improve tolerance for functional activity  Baseline:  Goal status: IN PROGRESS  3.  Patient will increase right knee mobility to -5 to 120 to promote normal navigation of steps; step over step pattern  Baseline:  Goal status: IN PROGRESS  4.  Patient will increase right leg MMTs to 5/5 without pain to promote return to ambulation community  distances with minimal deviation.  Baseline:  Goal status: IN PROGRESS  5.  Patient will increase distance on to 225 ft with LRAD  to demonstrate improved functional mobility walking household and community distances.   Baseline: 178 ft Goal status: IN PROGRESS  6.  Patient will improve 5 times sit to stand score from 20.55 sec to 15 sec without UE assist  to demonstrate improved functional mobility and increased lower extremity strength.  Baseline:  Goal status: IN PROGRESS  7.  Patient will improve FOTO by 10 points  Baseline: 50  Goal status: IN PROGRESS   PLAN:  PT FREQUENCY: 2x/week  PT DURATION: 4 weeks  PLANNED INTERVENTIONS: Therapeutic exercises, Therapeutic activity, Neuromuscular re-education, Balance training, Gait training, Patient/Family education, Joint manipulation, Joint mobilization, Stair training, Orthotic/Fit training, DME instructions, Aquatic Therapy, Dry Needling, Electrical stimulation, Spinal manipulation, Spinal mobilization, Cryotherapy, Moist heat, Compression bandaging, scar mobilization, Splintting, Taping, Traction, Ultrasound, Ionotophoresis 4mg /ml Dexamethasone, and Manual therapy   PLAN FOR NEXT SESSION: Progress right knee mobility and strength as able; gait, balance as appropriate   Lurena Nida, PTA 05/16/2023, 3:36 PM  3:36 PM, 05/16/23

## 2023-05-18 ENCOUNTER — Ambulatory Visit (HOSPITAL_COMMUNITY): Payer: Medicare Other | Admitting: Physical Therapy

## 2023-05-18 DIAGNOSIS — M25561 Pain in right knee: Secondary | ICD-10-CM | POA: Diagnosis not present

## 2023-05-18 DIAGNOSIS — M1711 Unilateral primary osteoarthritis, right knee: Secondary | ICD-10-CM | POA: Diagnosis not present

## 2023-05-18 DIAGNOSIS — G8929 Other chronic pain: Secondary | ICD-10-CM | POA: Diagnosis not present

## 2023-05-18 DIAGNOSIS — M25661 Stiffness of right knee, not elsewhere classified: Secondary | ICD-10-CM | POA: Diagnosis not present

## 2023-05-18 DIAGNOSIS — R262 Difficulty in walking, not elsewhere classified: Secondary | ICD-10-CM

## 2023-05-18 NOTE — Therapy (Signed)
OUTPATIENT PHYSICAL THERAPY LOWER EXTREMITY TREATMENT   Patient Name: Kelly Fox MRN: [id number] DOB:1951-05-12, 72 y.o., female Today's Date: 05/18/2023  END OF SESSION:  PT End of Session - 05/18/23 1349     Visit Number 5    Number of Visits 8    Date for PT Re-Evaluation 06/01/23    Authorization Type [employer] Medicare    Progress Note Due on Visit 10    PT Start Time 1307    PT Stop Time 1349    PT Time Calculation (min) 42 min    Activity Tolerance Patient tolerated treatment well    Behavior During Therapy [facility] for tasks assessed/performed                Past Medical History:  Diagnosis Date   Asthma    Past Surgical History:  Procedure Laterality Date   DENTAL SURGERY     KNEE ARTHROSCOPY Left 10/29/2022   Procedure: ARTHROSCOPY KNEE LEFT FOR SCAR TISSUE DEBRIDEMENT;  Surgeon: Vickki Hearing, MD;  Location: [facility];  Service: Orthopedics;  Laterality: Left;   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/02/2022   Procedure: TOTAL KNEE ARTHROPLASTY;  Surgeon: Vickki Hearing, MD;  Location: [facility];  Service: Orthopedics;  Laterality: Left;   TOTAL KNEE ARTHROPLASTY Right 04/19/2023   Procedure: TOTAL KNEE ARTHROPLASTY;  Surgeon: Vickki Hearing, MD;  Location: [facility];  Service: Orthopedics;  Laterality: Right;   Patient Active Problem List   Diagnosis Date Noted   Primary osteoarthritis of right knee 04/19/2023   Primary localized osteoarthritis of right knee 04/19/2023   Status post total left knee replacement February 02, 2022 01/28/2023   Patellar clunk syndrome of left knee 10/29/2022   Osteoarthritis of left knee 02/02/2022   Unilateral primary osteoarthritis, left knee    Elevated blood-pressure reading without diagnosis of hypertension 12/03/2021   Seasonal allergic rhinitis 12/03/2021    PCP: Nita Sells, MD  REFERRING PROVIDER: Vickki Hearing, MD  REFERRING DIAG: M17.11 (ICD-10-CM) - Primary osteoarthritis of right knee  THERAPY DIAG:  Stiffness  of right knee, not elsewhere classified  Chronic pain of right knee  Difficulty in walking, not elsewhere classified  Rationale for Evaluation and Treatment: Rehabilitation  ONSET DATE: 04/19/23  SUBJECTIVE:   SUBJECTIVE STATEMENT: Pt states it is feeling better today as compared to last visit.   Eval:  Patient with right knee OA for long time; had left knee done April of last year; s/p R TKA 04/18/23 per Dr. Romeo Apple at [facility]. Had home health up until yesterday.  CPM  PERTINENT HISTORY: Left knee TKA April 23 PAIN:  Are you having pain? Yes: NPRS scale: 4/10 Pain location: right knee Pain description: really sore Aggravating factors: being still, bending Relieving factors: massage, pain meds  PRECAUTIONS: None  WEIGHT BEARING RESTRICTIONS: No  FALLS:  Has patient fallen in last 6 months? No  OCCUPATION: retired  PLOF: Independent  PATIENT GOALS: walk straight without a limp or pain  NEXT MD VISIT: 7/11 staple removal   OBJECTIVE:   DIAGNOSTIC FINDINGS:  CLINICAL DATA:  Status post right total knee replacement   EXAM: PORTABLE RIGHT KNEE - 1-2 VIEW   COMPARISON:  03/28/2023   FINDINGS: Changes of right knee replacement. No hardware or bony complicating feature. Soft tissue and joint space gas noted. Anterior skin staples.   IMPRESSION: Right knee replacement.  No visible complicating feature.    PATIENT SURVEYS:  FOTO 50  COGNITION: Overall cognitive status: Within functional limits for  tasks assessed     SENSATION: WFL  EDEMA:  Yes normal for this time s/p; noted bruising right lower leg especially   PALPATION: General soreness right knee  LOWER EXTREMITY ROM:  Active ROM Right eval Left eval 05/09/23   Hip flexion      Hip extension      Hip abduction      Hip adduction      Hip internal rotation      Hip external rotation      Knee flexion 100  108   Knee extension -16  8   Ankle dorsiflexion      Ankle plantarflexion      Ankle  inversion      Ankle eversion       (Blank rows = not tested)  LOWER EXTREMITY MMT:  MMT Right eval Left eval  Hip flexion 3-   Hip extension    Hip abduction    Hip adduction    Hip internal rotation    Hip external rotation    Knee flexion    Knee extension 2+   Ankle dorsiflexion 3+   Ankle plantarflexion    Ankle inversion    Ankle eversion     (Blank rows = not tested)  FUNCTIONAL TESTS:  5 times sit to stand: 20.55 sec with UE assist 2 minute walk test: 178 ft with RW  GAIT: Distance walked: 178 ft Assistive device utilized: Walker - 2 wheeled Level of assistance: SBA Comments: antalgic gait; decreased stance right LE   TODAY'S TREATMENT:                                                                                                                              DATE:  05/18/23: Bike seat 10 full revolution x5 min Ambulation with SPC around clinic 226 feet Standing: Knee drive 84O96" on 12" step  Heel raise 15x  Slant board 3x 30"  Rt knee flexion 10X  4" lunge forward without UE assit Prone: Knee hang x 10 with soft tissue massage  05/16/23: Bike seat 10 full revolution x5 min Standing: Knee drive 29B28" on 12" step  Heel raise 15x  Slant board 3x 30" Prone: Knee hang x 5 with soft tissue massage  TKE 10x 5"  Hamstring curls 10x Supine: quad sets 10x 5"  Heel slides 10x  05/12/23: Bike seat 11 full revolution x5 min Standing:  Knee drive 5x 10" on 2nd step  Heel raise 15x  Toe 10x  GTB TKE 10x 5"  Slant board 3x 30" Prone:  Knee hang x 1'  TKE 10x 5"  Hamstring curls 10x Supine: quad sets 10x 5"  Heel slides 10x  AROM 6-112 degrees STS  05/09/23: Reviewed goals Educated importance of HEP Upon examination- may want to request additional steristrips due to openings on incision.  Returns to MD later today.    Supine: Quad sets 2x 10 Heel slides 10 SAQ 15x 5"  SLR 10x -quad sets prior with some extension lag  Seated:  LAQ 2 x10 STS  10x  Standing: Heel and toe raises 10x  TKE with GTB 10x 5" Knee drive on 08MV step 5x 10"   05/04/23 physical therapy evaluation and HEP instruction    PATIENT EDUCATION:  Education details: Patient educated on exam findings, POC, scope of PT, HEP, and what to expect next visit. Person educated: Patient Education method: Explanation, Demonstration, and Handouts Education comprehension: verbalized understanding, returned demonstration, verbal cues required, and tactile cues required  HOME EXERCISE PROGRAM: Access Code: [email or web address] URL: [email or web address] Date: 05/04/2023 Prepared by: [facility] - [facility]  Exercises - Supine Ankle Pumps  - 2 x daily - 7 x weekly - 2 sets - 15 reps - Supine Quadricep Sets  - 2 x daily - 7 x weekly - 1 sets - 10 reps - 5 sec hold - Supine Short Arc Quad  - 2 x daily - 7 x weekly - 1 sets - 15 reps - Supine Heel Slides  - 2 x daily - 7 x weekly - 1 sets - 15 reps - Seated Long Arc Quad  - 2 x daily - 7 x weekly - 1 sets - 15 reps - Seated Knee Flexion Stretch  - 2 x daily - 7 x weekly - 1 sets - 15 reps - Seated Heel Toe Raises  - 2 x daily - 7 x weekly - 1 sets - 10 reps - Seated March  - 2 x daily - 7 x weekly - 2 sets - 10 reps  Patient Education - Total Knee Replacement Handout  05/09/23:  STS ASSESSMENT:  CLINICAL IMPRESSION: Continued focus on knee mobility independence with gait. PT with improved gait quality today using SPC and encoruaged to continue to practice this on her own.  Forward lunges added to work on stability into Rt LE.   Pt required minimal cues for form, however some needed for posture with activity.  Soft tissue massage/Myofascial release completed longer this session as noted tightness in posterior lateral knee with prone knee hang.  Pt reported looser following therex and manual.  No reports of pain through session. Pt will continue to benefit from skilled therapy to reduce deficits.   Eval:  Patient is a 72 y.o.  female who was seen today for physical therapy evaluation and treatment for M17.11 (ICD-10-CM) - Primary osteoarthritis of right knee.  Patient demonstrates muscle weakness, reduced ROM, and fascial restrictions which are likely contributing to symptoms of pain and are negatively impacting patient ability to perform ADLs and functional mobility tasks. Patient will benefit from skilled physical therapy services to address these deficits to reduce pain and improve level of function with ADLs and functional mobility tasks.   OBJECTIVE IMPAIRMENTS: Abnormal gait, decreased activity tolerance, decreased endurance, decreased mobility, difficulty walking, decreased ROM, decreased strength, hypomobility, increased edema, increased fascial restrictions, impaired perceived functional ability, impaired flexibility, and pain.   ACTIVITY LIMITATIONS: carrying, lifting, bending, sitting, standing, squatting, sleeping, stairs, transfers, bed mobility, and locomotion level  PARTICIPATION LIMITATIONS: meal prep, cleaning, laundry, driving, shopping, community activity, and yard work  [facility] POTENTIAL: Good  CLINICAL DECISION MAKING: Stable/uncomplicated  EVALUATION COMPLEXITY: Low   GOALS: Goals reviewed with patient? No  SHORT TERM GOALS: Target date: 05/18/2023 patient will be independent with initial HEP  Baseline: Goal status: IN PROGRESS  2.  Patient will self report 30% improvement to improve tolerance for functional activity  Baseline:  Goal status: IN PROGRESS  LONG TERM GOALS: Target date: 06/01/2023  Patient will be independent in self management strategies to improve quality of life and functional outcomes.  Baseline:  Goal status: IN PROGRESS  2.  Patient will self report 50% improvement to improve tolerance for functional activity  Baseline:  Goal status: IN PROGRESS  3.  Patient will increase right knee mobility to -5 to 120 to promote normal navigation of steps; step over  step pattern  Baseline:  Goal status: IN PROGRESS  4.  Patient will increase right leg MMTs to 5/5 without pain to promote return to ambulation community distances with minimal deviation.  Baseline:  Goal status: IN PROGRESS  5.  Patient will increase distance on to 225 ft with LRAD  to demonstrate improved functional mobility walking household and community distances.   Baseline: 178 ft Goal status: IN PROGRESS  6.  Patient will improve 5 times sit to stand score from 20.55 sec to 15 sec without UE assist  to demonstrate improved functional mobility and increased lower extremity strength.  Baseline:  Goal status: IN PROGRESS  7.  Patient will improve FOTO by 10 points  Baseline: 50  Goal status: IN PROGRESS   PLAN:  PT FREQUENCY: 2x/week  PT DURATION: 4 weeks  PLANNED INTERVENTIONS: Therapeutic exercises, Therapeutic activity, Neuromuscular re-education, Balance training, Gait training, Patient/Family education, Joint manipulation, Joint mobilization, Stair training, Orthotic/Fit training, DME instructions, Aquatic Therapy, Dry Needling, Electrical stimulation, Spinal manipulation, Spinal mobilization, Cryotherapy, Moist heat, Compression bandaging, scar mobilization, Splintting, Taping, Traction, Ultrasound, Ionotophoresis 4mg /ml Dexamethasone, and Manual therapy   PLAN FOR NEXT SESSION: Progress right knee mobility and strength as able; gait, balance as appropriate   Emeline Gins B, PTA 05/18/2023, 1:50 PM  1:50 PM, 05/18/23

## 2023-05-20 ENCOUNTER — Telehealth (HOSPITAL_COMMUNITY): Payer: Self-pay

## 2023-05-20 ENCOUNTER — Ambulatory Visit (HOSPITAL_COMMUNITY): Payer: Medicare Other

## 2023-05-20 NOTE — Telephone Encounter (Signed)
No show, called and left message concerning missed apt today.  Reminded next apt date and time with contact number included if needs to cancel/reschedule apts in the future.    Becky Sax, LPTA/CLT; [place] [phone]

## 2023-05-23 ENCOUNTER — Ambulatory Visit (HOSPITAL_COMMUNITY): Payer: Medicare Other

## 2023-05-23 DIAGNOSIS — M1711 Unilateral primary osteoarthritis, right knee: Secondary | ICD-10-CM

## 2023-05-23 DIAGNOSIS — M25661 Stiffness of right knee, not elsewhere classified: Secondary | ICD-10-CM | POA: Diagnosis not present

## 2023-05-23 DIAGNOSIS — G8929 Other chronic pain: Secondary | ICD-10-CM

## 2023-05-23 DIAGNOSIS — R262 Difficulty in walking, not elsewhere classified: Secondary | ICD-10-CM | POA: Diagnosis not present

## 2023-05-23 DIAGNOSIS — M25561 Pain in right knee: Secondary | ICD-10-CM | POA: Diagnosis not present

## 2023-05-23 NOTE — Therapy (Signed)
OUTPATIENT PHYSICAL THERAPY LOWER EXTREMITY TREATMENT   Patient Name: Kelly Fox MRN: [id number] DOB:1951-02-15, 72 y.o., female Today's Date: 05/23/2023  END OF SESSION:  PT End of Session - 05/23/23 1357     Visit Number 6    Number of Visits 8    Date for PT Re-Evaluation 06/01/23    Authorization Type [employer] Medicare    Progress Note Due on Visit 10    PT Start Time 0155   late check in   PT Stop Time 0230    PT Time Calculation (min) 35 min    Activity Tolerance Patient tolerated treatment well    Behavior During Therapy [facility] for tasks assessed/performed                Past Medical History:  Diagnosis Date   Asthma    Past Surgical History:  Procedure Laterality Date   DENTAL SURGERY     KNEE ARTHROSCOPY Left 10/29/2022   Procedure: ARTHROSCOPY KNEE LEFT FOR SCAR TISSUE DEBRIDEMENT;  Surgeon: Vickki Hearing, MD;  Location: [facility];  Service: Orthopedics;  Laterality: Left;   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/02/2022   Procedure: TOTAL KNEE ARTHROPLASTY;  Surgeon: Vickki Hearing, MD;  Location: [facility];  Service: Orthopedics;  Laterality: Left;   TOTAL KNEE ARTHROPLASTY Right 04/19/2023   Procedure: TOTAL KNEE ARTHROPLASTY;  Surgeon: Vickki Hearing, MD;  Location: [facility];  Service: Orthopedics;  Laterality: Right;   Patient Active Problem List   Diagnosis Date Noted   Primary osteoarthritis of right knee 04/19/2023   Primary localized osteoarthritis of right knee 04/19/2023   Status post total left knee replacement February 02, 2022 01/28/2023   Patellar clunk syndrome of left knee 10/29/2022   Osteoarthritis of left knee 02/02/2022   Unilateral primary osteoarthritis, left knee    Elevated blood-pressure reading without diagnosis of hypertension 12/03/2021   Seasonal allergic rhinitis 12/03/2021    PCP: Nita Sells, MD  REFERRING PROVIDER: Vickki Hearing, MD  REFERRING DIAG: M17.11 (ICD-10-CM) - Primary osteoarthritis of right knee  THERAPY  DIAG:  Stiffness of right knee, not elsewhere classified  Chronic pain of right knee  Difficulty in walking, not elsewhere classified  Osteoarthritis of right knee, unspecified osteoarthritis type  Rationale for Evaluation and Treatment: Rehabilitation  ONSET DATE: 04/19/23  SUBJECTIVE:   SUBJECTIVE STATEMENT: Late arrival today; husband had a episode just prior to leaving for appointment and that delayed her arrival.  Right knee is sore today; lateral soreness and "I think I slept wrong"; walks into the clinic with SPC.   Eval:  Patient with right knee OA for long time; had left knee done April of last year; s/p R TKA 04/18/23 per Dr. Romeo Apple at [facility]. Had home health up until yesterday.  CPM  PERTINENT HISTORY: Left knee TKA April 23 PAIN:  Are you having pain? Yes: NPRS scale: 4/10 Pain location: right knee Pain description: really sore Aggravating factors: being still, bending Relieving factors: massage, pain meds  PRECAUTIONS: None  WEIGHT BEARING RESTRICTIONS: No  FALLS:  Has patient fallen in last 6 months? No  OCCUPATION: retired  PLOF: Independent  PATIENT GOALS: walk straight without a limp or pain  NEXT MD VISIT: 7/11 staple removal   OBJECTIVE:   DIAGNOSTIC FINDINGS:  CLINICAL DATA:  Status post right total knee replacement   EXAM: PORTABLE RIGHT KNEE - 1-2 VIEW   COMPARISON:  03/28/2023   FINDINGS: Changes of right knee replacement. No hardware or bony complicating feature.  Soft tissue and joint space gas noted. Anterior skin staples.   IMPRESSION: Right knee replacement.  No visible complicating feature.    PATIENT SURVEYS:  FOTO 50  COGNITION: Overall cognitive status: Within functional limits for tasks assessed     SENSATION: WFL  EDEMA:  Yes normal for this time s/p; noted bruising right lower leg especially   PALPATION: General soreness right knee  LOWER EXTREMITY ROM:  Active ROM Right eval Left eval Right 05/09/23  Right 05/23/23  Hip flexion      Hip extension      Hip abduction      Hip adduction      Hip internal rotation      Hip external rotation      Knee flexion 100  108 115  Knee extension -16  8 Lacking 6  Ankle dorsiflexion      Ankle plantarflexion      Ankle inversion      Ankle eversion       (Blank rows = not tested)  LOWER EXTREMITY MMT:  MMT Right eval Left eval Right 05/23/23  Hip flexion 3-  4+  Hip extension     Hip abduction     Hip adduction     Hip internal rotation     Hip external rotation     Knee flexion   4+ (isometric midrange)  Knee extension 2+  4+  Ankle dorsiflexion 3+  4+  Ankle plantarflexion     Ankle inversion     Ankle eversion      (Blank rows = not tested)  FUNCTIONAL TESTS:  5 times sit to stand: 20.55 sec with UE assist 2 minute walk test: 178 ft with RW  GAIT: Distance walked: 178 ft Assistive device utilized: Environmental consultant - 2 wheeled Level of assistance: SBA Comments: antalgic gait; decreased stance right LE   TODAY'S TREATMENT:                                                                                                                              DATE:  05/23/23 Bike seat 10 full revolution x 5' dynamic warm up  Standing: Heel toe raises x 20 Slant board 5 x 20" 12" step knee drives for flexion x 2\' 12"  step hamstring stretch 5 x 10"  Seated hamstring stretch 5 x 20"  AROM right knee -6 to 115 MMT's see above grossly 4+/5    05/18/23: Bike seat 10 full revolution x5 min Ambulation with SPC around clinic 226 feet Standing: Knee drive 16X09" on 12" step  Heel raise 15x  Slant board 3x 30"  Rt knee flexion 10X  4" lunge forward without UE assit Prone: Knee hang x 10 with soft tissue massage  05/16/23: Bike seat 10 full revolution x5 min Standing: Knee drive 60A54" on 12" step  Heel raise 15x  Slant board 3x 30" Prone: Knee hang x 5 with soft tissue massage  TKE 10x 5"  Hamstring curls 10x Supine: quad sets 10x  5"  Heel slides 10x  05/12/23: Bike seat 11 full revolution x5 min Standing:  Knee drive 5x 10" on 2nd step  Heel raise 15x  Toe 10x  GTB TKE 10x 5"  Slant board 3x 30" Prone:  Knee hang x 1'  TKE 10x 5"  Hamstring curls 10x Supine: quad sets 10x 5"  Heel slides 10x  AROM 6-112 degrees STS  05/09/23: Reviewed goals Educated importance of HEP Upon examination- may want to request additional steristrips due to openings on incision.  Returns to MD later today.    Supine: Quad sets 2x 10 Heel slides 10 SAQ 15x 5" SLR 10x -quad sets prior with some extension lag  Seated:  LAQ 2 x10 STS 10x  Standing: Heel and toe raises 10x  TKE with GTB 10x 5" Knee drive on 53GU step 5x 10"   05/04/23 physical therapy evaluation and HEP instruction    PATIENT EDUCATION:  Education details: Patient educated on exam findings, POC, scope of PT, HEP, and what to expect next visit. Person educated: Patient Education method: Explanation, Demonstration, and Handouts Education comprehension: verbalized understanding, returned demonstration, verbal cues required, and tactile cues required  HOME EXERCISE PROGRAM: Access Code: [email or web address] URL: [email or web address] Date: 05/04/2023 Prepared by: [facility]  Exercises - Supine Ankle Pumps  - 2 x daily - 7 x weekly - 2 sets - 15 reps - Supine Quadricep Sets  - 2 x daily - 7 x weekly - 1 sets - 10 reps - 5 sec hold - Supine Short Arc Quad  - 2 x daily - 7 x weekly - 1 sets - 15 reps - Supine Heel Slides  - 2 x daily - 7 x weekly - 1 sets - 15 reps - Seated Long Arc Quad  - 2 x daily - 7 x weekly - 1 sets - 15 reps - Seated Knee Flexion Stretch  - 2 x daily - 7 x weekly - 1 sets - 15 reps - Seated Heel Toe Raises  - 2 x daily - 7 x weekly - 1 sets - 10 reps - Seated March  - 2 x daily - 7 x weekly - 2 sets - 10 reps  Patient Education - Total Knee Replacement Handout  05/09/23:  STS ASSESSMENT:  CLINICAL IMPRESSION: Late  arrival today; patient with some mild swelling right lateral knee and has some mild redness noted right lateral knee.  No heat noted; no temp per patient. Discussed signs and symptoms of infection and blood clot. Patient with good progress with mobility and strength.   Pt will continue to benefit from skilled therapy to reduce deficits.   Eval:  Patient is a 72 y.o. female who was seen today for physical therapy evaluation and treatment for M17.11 (ICD-10-CM) - Primary osteoarthritis of right knee.  Patient demonstrates muscle weakness, reduced ROM, and fascial restrictions which are likely contributing to symptoms of pain and are negatively impacting patient ability to perform ADLs and functional mobility tasks. Patient will benefit from skilled physical therapy services to address these deficits to reduce pain and improve level of function with ADLs and functional mobility tasks.   OBJECTIVE IMPAIRMENTS: Abnormal gait, decreased activity tolerance, decreased endurance, decreased mobility, difficulty walking, decreased ROM, decreased strength, hypomobility, increased edema, increased fascial restrictions, impaired perceived functional ability, impaired flexibility, and pain.   ACTIVITY LIMITATIONS: carrying, lifting, bending, sitting, standing, squatting, sleeping, stairs, transfers, bed mobility, and locomotion level  PARTICIPATION  LIMITATIONS: meal prep, cleaning, laundry, driving, shopping, community activity, and yard work  [facility] POTENTIAL: Good  CLINICAL DECISION MAKING: Stable/uncomplicated  EVALUATION COMPLEXITY: Low   GOALS: Goals reviewed with patient? No  SHORT TERM GOALS: Target date: 05/18/2023 patient will be independent with initial HEP  Baseline: Goal status: IN PROGRESS  2.  Patient will self report 30% improvement to improve tolerance for functional activity  Baseline:  Goal status: IN PROGRESS   LONG TERM GOALS: Target date: 06/01/2023  Patient will be  independent in self management strategies to improve quality of life and functional outcomes.  Baseline:  Goal status: IN PROGRESS  2.  Patient will self report 50% improvement to improve tolerance for functional activity  Baseline:  Goal status: IN PROGRESS  3.  Patient will increase right knee mobility to -5 to 120 to promote normal navigation of steps; step over step pattern  Baseline:  Goal status: IN PROGRESS  4.  Patient will increase right leg MMTs to 5/5 without pain to promote return to ambulation community distances with minimal deviation.  Baseline:  Goal status: IN PROGRESS  5.  Patient will increase distance on to 225 ft with LRAD  to demonstrate improved functional mobility walking household and community distances.   Baseline: 178 ft Goal status: IN PROGRESS  6.  Patient will improve 5 times sit to stand score from 20.55 sec to 15 sec without UE assist  to demonstrate improved functional mobility and increased lower extremity strength.  Baseline:  Goal status: IN PROGRESS  7.  Patient will improve FOTO by 10 points  Baseline: 50  Goal status: IN PROGRESS   PLAN:  PT FREQUENCY: 2x/week  PT DURATION: 4 weeks  PLANNED INTERVENTIONS: Therapeutic exercises, Therapeutic activity, Neuromuscular re-education, Balance training, Gait training, Patient/Family education, Joint manipulation, Joint mobilization, Stair training, Orthotic/Fit training, DME instructions, Aquatic Therapy, Dry Needling, Electrical stimulation, Spinal manipulation, Spinal mobilization, Cryotherapy, Moist heat, Compression bandaging, scar mobilization, Splintting, Taping, Traction, Ultrasound, Ionotophoresis 4mg /ml Dexamethasone, and Manual therapy   PLAN FOR NEXT SESSION: Progress right knee mobility and strength as able; gait, balance as appropriate; sees [place] on Thursday 8/1  2:28 PM, 05/23/23 Kinnedy Mongiello Small Ronney [employer] MPT [facility] [place] [phone] Ph:[phone]

## 2023-05-24 ENCOUNTER — Other Ambulatory Visit: Payer: Self-pay | Admitting: Orthopedic Surgery

## 2023-05-25 ENCOUNTER — Ambulatory Visit (HOSPITAL_COMMUNITY): Payer: Medicare Other | Admitting: Physical Therapy

## 2023-05-25 DIAGNOSIS — M25661 Stiffness of right knee, not elsewhere classified: Secondary | ICD-10-CM

## 2023-05-25 DIAGNOSIS — R262 Difficulty in walking, not elsewhere classified: Secondary | ICD-10-CM

## 2023-05-25 DIAGNOSIS — G8929 Other chronic pain: Secondary | ICD-10-CM

## 2023-05-25 DIAGNOSIS — M1711 Unilateral primary osteoarthritis, right knee: Secondary | ICD-10-CM

## 2023-05-25 DIAGNOSIS — M25561 Pain in right knee: Secondary | ICD-10-CM | POA: Diagnosis not present

## 2023-05-25 NOTE — Therapy (Signed)
OUTPATIENT PHYSICAL THERAPY LOWER EXTREMITY TREATMENT   Patient Name: Kelly Fox MRN: [id number] DOB:Nov 17, 1950, 72 y.o., female Today's Date: 05/25/2023  END OF SESSION:  PT End of Session - 05/25/23 1427     Visit Number 7    Number of Visits 8    Date for PT Re-Evaluation 06/01/23    Authorization Type [employer] Medicare    Progress Note Due on Visit 10    PT Start Time 1305    PT Stop Time 1346    PT Time Calculation (min) 41 min    Activity Tolerance Patient tolerated treatment well    Behavior During Therapy [facility] for tasks assessed/performed                 Past Medical History:  Diagnosis Date   Asthma    Past Surgical History:  Procedure Laterality Date   DENTAL SURGERY     KNEE ARTHROSCOPY Left 10/29/2022   Procedure: ARTHROSCOPY KNEE LEFT FOR SCAR TISSUE DEBRIDEMENT;  Surgeon: Vickki Hearing, MD;  Location: [facility];  Service: Orthopedics;  Laterality: Left;   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/02/2022   Procedure: TOTAL KNEE ARTHROPLASTY;  Surgeon: Vickki Hearing, MD;  Location: [facility];  Service: Orthopedics;  Laterality: Left;   TOTAL KNEE ARTHROPLASTY Right 04/19/2023   Procedure: TOTAL KNEE ARTHROPLASTY;  Surgeon: Vickki Hearing, MD;  Location: [facility];  Service: Orthopedics;  Laterality: Right;   Patient Active Problem List   Diagnosis Date Noted   Primary osteoarthritis of right knee 04/19/2023   Primary localized osteoarthritis of right knee 04/19/2023   Status post total left knee replacement February 02, 2022 01/28/2023   Patellar clunk syndrome of left knee 10/29/2022   Osteoarthritis of left knee 02/02/2022   Unilateral primary osteoarthritis, left knee    Elevated blood-pressure reading without diagnosis of hypertension 12/03/2021   Seasonal allergic rhinitis 12/03/2021    PCP: Nita Sells, MD  REFERRING PROVIDER: Vickki Hearing, MD  REFERRING DIAG: M17.11 (ICD-10-CM) - Primary osteoarthritis of right knee  THERAPY DIAG:   Stiffness of right knee, not elsewhere classified  Chronic pain of right knee  Difficulty in walking, not elsewhere classified  Osteoarthritis of right knee, unspecified osteoarthritis type  Rationale for Evaluation and Treatment: Rehabilitation  ONSET DATE: 04/19/23  SUBJECTIVE:   SUBJECTIVE STATEMENT: Pt reports ongoing Rt knee soreness and stiffness, especially on lateral side when she walks.    Eval:  Patient with right knee OA for long time; had left knee done April of last year; s/p R TKA 04/18/23 per Dr. Romeo Apple at [facility]. Had home health up until yesterday.  CPM  PERTINENT HISTORY: Left knee TKA April 23 PAIN:  Are you having pain? Yes: NPRS scale: 4/10 Pain location: right knee Pain description: really sore Aggravating factors: being still, bending Relieving factors: massage, pain meds  PRECAUTIONS: None  WEIGHT BEARING RESTRICTIONS: No  FALLS:  Has patient fallen in last 6 months? No  OCCUPATION: retired  PLOF: Independent  PATIENT GOALS: walk straight without a limp or pain  NEXT MD VISIT: 7/11 staple removal   OBJECTIVE:   DIAGNOSTIC FINDINGS:  CLINICAL DATA:  Status post right total knee replacement   EXAM: PORTABLE RIGHT KNEE - 1-2 VIEW   COMPARISON:  03/28/2023   FINDINGS: Changes of right knee replacement. No hardware or bony complicating feature. Soft tissue and joint space gas noted. Anterior skin staples.   IMPRESSION: Right knee replacement.  No visible complicating feature.    PATIENT  SURVEYS:  FOTO 50  COGNITION: Overall cognitive status: Within functional limits for tasks assessed     SENSATION: WFL  EDEMA:  Yes normal for this time s/p; noted bruising right lower leg especially   PALPATION: General soreness right knee  LOWER EXTREMITY ROM:  Active ROM Right eval Left eval Right 05/09/23 Right 05/23/23  Hip flexion      Hip extension      Hip abduction      Hip adduction      Hip internal rotation      Hip  external rotation      Knee flexion 100  108 115  Knee extension -16  8 Lacking 6  Ankle dorsiflexion      Ankle plantarflexion      Ankle inversion      Ankle eversion       (Blank rows = not tested)  LOWER EXTREMITY MMT:  MMT Right eval Left eval Right 05/23/23  Hip flexion 3-  4+  Hip extension     Hip abduction     Hip adduction     Hip internal rotation     Hip external rotation     Knee flexion   4+ (isometric midrange)  Knee extension 2+  4+  Ankle dorsiflexion 3+  4+  Ankle plantarflexion     Ankle inversion     Ankle eversion      (Blank rows = not tested)  FUNCTIONAL TESTS:  5 times sit to stand: 20.55 sec with UE assist 2 minute walk test: 178 ft with RW  GAIT: Distance walked: 178 ft Assistive device utilized: Environmental consultant - 2 wheeled Level of assistance: SBA Comments: antalgic gait; decreased stance right LE   TODAY'S TREATMENT:                                                                                                                              DATE:  05/25/23 Bike seat 10 full revolution x 5' dynamic warm up Standing: Heel toe raises x 20 Slant board  3x 30" 12" step knee drives for flexion 10X10" holds 12" step hamstring stretch 3 x 30" Supine:  manual soft tissue and MFR with elevation  05/23/23 Bike seat 10 full revolution x 5' dynamic warm up  Standing: Heel toe raises x 20 Slant board 5 x 20" 12" step knee drives for flexion x 2\' 12"  step hamstring stretch 5 x 10"  Seated hamstring stretch 5 x 20"  AROM right knee -6 to 115 MMT's see above grossly 4+/5    05/18/23: Bike seat 10 full revolution x5 min Ambulation with SPC around clinic 226 feet Standing: Knee drive 29F62" on 12" step  Heel raise 15x  Slant board 3x 30"  Rt knee flexion 10X  4" lunge forward without UE assit Prone: Knee hang x 10 with soft tissue massage  05/16/23: Bike seat 10 full revolution x5 min Standing: Knee drive 13Y86" on 12" step  Heel raise  15x  Slant board 3x 30" Prone: Knee hang x 5 with soft tissue massage  TKE 10x 5"  Hamstring curls 10x Supine: quad sets 10x 5"  Heel slides 10x  05/12/23: Bike seat 11 full revolution x5 min Standing:  Knee drive 5x 10" on 2nd step  Heel raise 15x  Toe 10x  GTB TKE 10x 5"  Slant board 3x 30" Prone:  Knee hang x 1'  TKE 10x 5"  Hamstring curls 10x Supine: quad sets 10x 5"  Heel slides 10x  AROM 6-112 degrees STS  05/09/23: Reviewed goals Educated importance of HEP Upon examination- may want to request additional steristrips due to openings on incision.  Returns to MD later today.    Supine: Quad sets 2x 10 Heel slides 10 SAQ 15x 5" SLR 10x -quad sets prior with some extension lag  Seated:  LAQ 2 x10 STS 10x  Standing: Heel and toe raises 10x  TKE with GTB 10x 5" Knee drive on 16XW step 5x 10"   05/04/23 physical therapy evaluation and HEP instruction    PATIENT EDUCATION:  Education details: Patient educated on exam findings, POC, scope of PT, HEP, and what to expect next visit. Person educated: Patient Education method: Explanation, Demonstration, and Handouts Education comprehension: verbalized understanding, returned demonstration, verbal cues required, and tactile cues required  HOME EXERCISE PROGRAM: Access Code: [email or web address] URL: [email or web address] Date: 05/04/2023 Prepared by: [facility] - Rehab  Exercises - Supine Ankle Pumps  - 2 x daily - 7 x weekly - 2 sets - 15 reps - Supine Quadricep Sets  - 2 x daily - 7 x weekly - 1 sets - 10 reps - 5 sec hold - Supine Short Arc Quad  - 2 x daily - 7 x weekly - 1 sets - 15 reps - Supine Heel Slides  - 2 x daily - 7 x weekly - 1 sets - 15 reps - Seated Long Arc Quad  - 2 x daily - 7 x weekly - 1 sets - 15 reps - Seated Knee Flexion Stretch  - 2 x daily - 7 x weekly - 1 sets - 15 reps - Seated Heel Toe Raises  - 2 x daily - 7 x weekly - 1 sets - 10 reps - Seated March  - 2 x daily - 7 x weekly - 2  sets - 10 reps  Patient Education - Total Knee Replacement Handout  05/09/23:  STS ASSESSMENT:  CLINICAL IMPRESSION: Noted edema, erythema and hypomobile scar.  Completed therex to improve strength and mobility and worked on reducing edema and tightness in knee.  Manual completed including soft tissue massage and myofascial techniques.  Most tightness at proximal scar with full reduction following manual.  PT reported it felt so much better at end of session, no pain.  AROM still at 115.   Pt will continue to benefit from skilled therapy to reduce deficits.   Eval:  Patient is a 72 y.o. female who was seen today for physical therapy evaluation and treatment for M17.11 (ICD-10-CM) - Primary osteoarthritis of right knee.  Patient demonstrates muscle weakness, reduced ROM, and fascial restrictions which are likely contributing to symptoms of pain and are negatively impacting patient ability to perform ADLs and functional mobility tasks. Patient will benefit from skilled physical therapy services to address these deficits to reduce pain and improve level of function with ADLs and functional mobility tasks.   OBJECTIVE IMPAIRMENTS: Abnormal gait, decreased activity tolerance, decreased endurance, decreased mobility,  difficulty walking, decreased ROM, decreased strength, hypomobility, increased edema, increased fascial restrictions, impaired perceived functional ability, impaired flexibility, and pain.   ACTIVITY LIMITATIONS: carrying, lifting, bending, sitting, standing, squatting, sleeping, stairs, transfers, bed mobility, and locomotion level  PARTICIPATION LIMITATIONS: meal prep, cleaning, laundry, driving, shopping, community activity, and yard work  [facility] POTENTIAL: Good  CLINICAL DECISION MAKING: Stable/uncomplicated  EVALUATION COMPLEXITY: Low   GOALS: Goals reviewed with patient? No  SHORT TERM GOALS: Target date: 05/18/2023 patient will be independent with initial  HEP  Baseline: Goal status: IN PROGRESS  2.  Patient will self report 30% improvement to improve tolerance for functional activity  Baseline:  Goal status: IN PROGRESS   LONG TERM GOALS: Target date: 06/01/2023  Patient will be independent in self management strategies to improve quality of life and functional outcomes.  Baseline:  Goal status: IN PROGRESS  2.  Patient will self report 50% improvement to improve tolerance for functional activity  Baseline:  Goal status: IN PROGRESS  3.  Patient will increase right knee mobility to -5 to 120 to promote normal navigation of steps; step over step pattern  Baseline:  Goal status: IN PROGRESS  4.  Patient will increase right leg MMTs to 5/5 without pain to promote return to ambulation community distances with minimal deviation.  Baseline:  Goal status: IN PROGRESS  5.  Patient will increase distance on to 225 ft with LRAD  to demonstrate improved functional mobility walking household and community distances.   Baseline: 178 ft Goal status: IN PROGRESS  6.  Patient will improve 5 times sit to stand score from 20.55 sec to 15 sec without UE assist  to demonstrate improved functional mobility and increased lower extremity strength.  Baseline:  Goal status: IN PROGRESS  7.  Patient will improve FOTO by 10 points  Baseline: 50  Goal status: IN PROGRESS   PLAN:  PT FREQUENCY: 2x/week  PT DURATION: 4 weeks  PLANNED INTERVENTIONS: Therapeutic exercises, Therapeutic activity, Neuromuscular re-education, Balance training, Gait training, Patient/Family education, Joint manipulation, Joint mobilization, Stair training, Orthotic/Fit training, DME instructions, Aquatic Therapy, Dry Needling, Electrical stimulation, Spinal manipulation, Spinal mobilization, Cryotherapy, Moist heat, Compression bandaging, scar mobilization, Splintting, Taping, Traction, Ultrasound, Ionotophoresis 4mg /ml Dexamethasone, and Manual therapy   PLAN  FOR NEXT SESSION: Progress right knee mobility and strength as able; gait, balance as appropriate; sees [place] on Thursday 8/1  2:28 PM, 05/25/23 Lurena Nida, PTA/CLT [facility] Ph: [phone]

## 2023-05-26 ENCOUNTER — Encounter: Payer: Self-pay | Admitting: Orthopedic Surgery

## 2023-05-26 ENCOUNTER — Ambulatory Visit (INDEPENDENT_AMBULATORY_CARE_PROVIDER_SITE_OTHER): Payer: Medicare Other | Admitting: Orthopedic Surgery

## 2023-05-26 DIAGNOSIS — M5431 Sciatica, right side: Secondary | ICD-10-CM

## 2023-05-26 DIAGNOSIS — Z96651 Presence of right artificial knee joint: Secondary | ICD-10-CM

## 2023-05-26 MED ORDER — PREDNISONE 10 MG (48) PO TBPK
ORAL_TABLET | Freq: Every day | ORAL | 0 refills | Status: DC
Start: 2023-05-26 — End: 2023-06-30

## 2023-05-26 NOTE — Progress Notes (Signed)
Chief Complaint  Patient presents with   Follow-up    Recheck on right knee, DOS 04-19-23.    Encounter Diagnoses  Name Primary?   Status post total right knee replacement April 19, 2023    Sciatica of right side Yes   Kelly Fox is now improving but she has developed some sciatica on the right side with pain radiating from her back down across her knee into her right ankle  She is taking tramadol for pain she is improving her range of motion is 3-108 degrees  Knee incision looks good quadriceps control good  Continue therapy follow-up in 5 weeks

## 2023-05-27 ENCOUNTER — Ambulatory Visit (HOSPITAL_COMMUNITY): Payer: Medicare Other | Attending: Orthopedic Surgery

## 2023-05-27 ENCOUNTER — Encounter (HOSPITAL_COMMUNITY): Payer: Self-pay

## 2023-05-27 DIAGNOSIS — G8929 Other chronic pain: Secondary | ICD-10-CM | POA: Insufficient documentation

## 2023-05-27 DIAGNOSIS — M25561 Pain in right knee: Secondary | ICD-10-CM | POA: Diagnosis not present

## 2023-05-27 DIAGNOSIS — M1711 Unilateral primary osteoarthritis, right knee: Secondary | ICD-10-CM | POA: Insufficient documentation

## 2023-05-27 DIAGNOSIS — R262 Difficulty in walking, not elsewhere classified: Secondary | ICD-10-CM | POA: Diagnosis not present

## 2023-05-27 DIAGNOSIS — M25661 Stiffness of right knee, not elsewhere classified: Secondary | ICD-10-CM | POA: Diagnosis not present

## 2023-05-27 NOTE — Therapy (Signed)
OUTPATIENT PHYSICAL THERAPY LOWER EXTREMITY TREATMENT   Patient Name: Kelly Fox MRN: [id number] DOB:05-23-1951, 72 y.o., female Today's Date: 05/27/2023  END OF SESSION:  PT End of Session - 05/27/23 1339     Visit Number 8    Number of Visits 16    Date for PT Re-Evaluation 06/24/23    Authorization Type [employer]    Progress Note Due on Visit 16    PT Start Time 1311    PT Stop Time 1349    PT Time Calculation (min) 38 min    Activity Tolerance Patient tolerated treatment well    Behavior During Therapy [facility] for tasks assessed/performed                  Past Medical History:  Diagnosis Date   Asthma    Past Surgical History:  Procedure Laterality Date   DENTAL SURGERY     KNEE ARTHROSCOPY Left 10/29/2022   Procedure: ARTHROSCOPY KNEE LEFT FOR SCAR TISSUE DEBRIDEMENT;  Surgeon: Vickki Hearing, MD;  Location: [facility];  Service: Orthopedics;  Laterality: Left;   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/02/2022   Procedure: TOTAL KNEE ARTHROPLASTY;  Surgeon: Vickki Hearing, MD;  Location: [facility];  Service: Orthopedics;  Laterality: Left;   TOTAL KNEE ARTHROPLASTY Right 04/19/2023   Procedure: TOTAL KNEE ARTHROPLASTY;  Surgeon: Vickki Hearing, MD;  Location: [facility];  Service: Orthopedics;  Laterality: Right;   Patient Active Problem List   Diagnosis Date Noted   Primary osteoarthritis of right knee 04/19/2023   Primary localized osteoarthritis of right knee 04/19/2023   Status post total left knee replacement February 02, 2022 01/28/2023   Patellar clunk syndrome of left knee 10/29/2022   Osteoarthritis of left knee 02/02/2022   Unilateral primary osteoarthritis, left knee    Elevated blood-pressure reading without diagnosis of hypertension 12/03/2021   Seasonal allergic rhinitis 12/03/2021    PCP: Nita Sells, MD  REFERRING PROVIDER: Vickki Hearing, MD  REFERRING DIAG: M17.11 (ICD-10-CM) - Primary osteoarthritis of right knee  THERAPY DIAG:   Stiffness of right knee, not elsewhere classified  Chronic pain of right knee  Difficulty in walking, not elsewhere classified  Osteoarthritis of right knee, unspecified osteoarthritis type  Rationale for Evaluation and Treatment: Rehabilitation  ONSET DATE: 04/19/23  SUBJECTIVE:   SUBJECTIVE STATEMENT: Pt stated she has been on her feet more today, pain scale 5/10.  Feels she has improved 85% since beginning therapy.  Reports HEP daily.  Stated she saw Romeo Apple yesterday, MD wishes to improve knee extension.  Stated her pain down to calf may be related to pinched nerve.  Eval:  Patient with right knee OA for long time; had left knee done April of last year; s/p R TKA 04/18/23 per Dr. Romeo Apple at [facility]. Had home health up until yesterday.  CPM  PERTINENT HISTORY: Left knee TKA April 23 PAIN:  Are you having pain? Yes: NPRS scale: 5/10 Pain location: right knee Pain description: really sore Aggravating factors: being still, bending Relieving factors: massage, pain meds  PRECAUTIONS: None  WEIGHT BEARING RESTRICTIONS: No  FALLS:  Has patient fallen in last 6 months? No  OCCUPATION: retired  PLOF: Independent  PATIENT GOALS: walk straight without a limp or pain  NEXT MD VISIT: 7/11 staple removal   OBJECTIVE:   DIAGNOSTIC FINDINGS:  CLINICAL DATA:  Status post right total knee replacement   EXAM: PORTABLE RIGHT KNEE - 1-2 VIEW   COMPARISON:  03/28/2023   FINDINGS: Changes  of right knee replacement. No hardware or bony complicating feature. Soft tissue and joint space gas noted. Anterior skin staples.   IMPRESSION: Right knee replacement.  No visible complicating feature.    PATIENT SURVEYS:  FOTO 50  COGNITION: Overall cognitive status: Within functional limits for tasks assessed     SENSATION: WFL  EDEMA:  Yes normal for this time s/p; noted bruising right lower leg especially   PALPATION: General soreness right knee  LOWER EXTREMITY  ROM:  Active ROM Right eval Left eval Right 05/09/23 Right 05/23/23  Hip flexion      Hip extension      Hip abduction      Hip adduction      Hip internal rotation      Hip external rotation      Knee flexion 100  108 115  Knee extension -16  8 Lacking 6  Ankle dorsiflexion      Ankle plantarflexion      Ankle inversion      Ankle eversion       (Blank rows = not tested)  LOWER EXTREMITY MMT:  MMT Right eval Left eval Right 05/23/23 05/27/23: Right  Hip flexion 3-  4+   Hip extension    3+  Hip abduction    4/5   Hip adduction      Hip internal rotation      Hip external rotation      Knee flexion   4+ (isometric midrange) 4+ prone  Knee extension 2+  4+   Ankle dorsiflexion 3+  4+   Ankle plantarflexion      Ankle inversion      Ankle eversion       (Blank rows = not tested)  FUNCTIONAL TESTS:  5 times sit to stand: 20.55 sec with UE assist 2 minute walk test: 178 ft with RW  GAIT: Distance walked: 178 ft Assistive device utilized: Environmental consultant - 2 wheeled Level of assistance: SBA Comments: antalgic gait; decreased stance right LE   TODAY'S TREATMENT:                                                                                                                              DATE:  05/27/23: Bike seat 8 full revolution x 5' dynamic warm up 237ft no AD MMT see above ROM measurement 6-116 degrees 5STS 18.03", 2nd set 11.75" no HHA Reciprocal pattern stairs 7in 5RT  05/25/23 Bike seat 10 full revolution x 5' dynamic warm up Standing: Heel toe raises x 20 Slant board  3x 30" 12" step knee drives for flexion 10X10" holds 12" step hamstring stretch 3 x 30" Supine:  manual soft tissue and MFR with elevation  05/23/23 Bike seat 10 full revolution x 5' dynamic warm up  Standing: Heel toe raises x 20 Slant board 5 x 20" 12" step knee drives for flexion x 2\' 12"  step hamstring stretch 5 x 10"  Seated hamstring stretch  5 x 20"  AROM right knee -6 to  115 MMT's see above grossly 4+/5    05/18/23: Bike seat 10 full revolution x5 min Ambulation with SPC around clinic 226 feet Standing: Knee drive 40J81" on 12" step  Heel raise 15x  Slant board 3x 30"  Rt knee flexion 10X  4" lunge forward without UE assit Prone: Knee hang x 10 with soft tissue massage  05/16/23: Bike seat 10 full revolution x5 min Standing: Knee drive 19J47" on 12" step  Heel raise 15x  Slant board 3x 30" Prone: Knee hang x 5 with soft tissue massage  TKE 10x 5"  Hamstring curls 10x Supine: quad sets 10x 5"  Heel slides 10x  05/12/23: Bike seat 11 full revolution x5 min Standing:  Knee drive 5x 10" on 2nd step  Heel raise 15x  Toe 10x  GTB TKE 10x 5"  Slant board 3x 30" Prone:  Knee hang x 1'  TKE 10x 5"  Hamstring curls 10x Supine: quad sets 10x 5"  Heel slides 10x  AROM 6-112 degrees STS  05/09/23: Reviewed goals Educated importance of HEP Upon examination- may want to request additional steristrips due to openings on incision.  Returns to MD later today.    Supine: Quad sets 2x 10 Heel slides 10 SAQ 15x 5" SLR 10x -quad sets prior with some extension lag  Seated:  LAQ 2 x10 STS 10x  Standing: Heel and toe raises 10x  TKE with GTB 10x 5" Knee drive on 82NF step 5x 10"   05/04/23 physical therapy evaluation and HEP instruction    PATIENT EDUCATION:  Education details: Patient educated on exam findings, POC, scope of PT, HEP, and what to expect next visit. Person educated: Patient Education method: Explanation, Demonstration, and Handouts Education comprehension: verbalized understanding, returned demonstration, verbal cues required, and tactile cues required  HOME EXERCISE PROGRAM: Access Code: [email or web address] URL: [email or web address] Date: 05/04/2023 Prepared by: [facility]  Exercises - Supine Ankle Pumps  - 2 x daily - 7 x weekly - 2 sets - 15 reps - Supine Quadricep Sets  - 2 x daily - 7 x weekly - 1 sets - 10  reps - 5 sec hold - Supine Short Arc Quad  - 2 x daily - 7 x weekly - 1 sets - 15 reps - Supine Heel Slides  - 2 x daily - 7 x weekly - 1 sets - 15 reps - Seated Long Arc Quad  - 2 x daily - 7 x weekly - 1 sets - 15 reps - Seated Knee Flexion Stretch  - 2 x daily - 7 x weekly - 1 sets - 15 reps - Seated Heel Toe Raises  - 2 x daily - 7 x weekly - 1 sets - 10 reps - Seated March  - 2 x daily - 7 x weekly - 2 sets - 10 reps  Patient Education - Total Knee Replacement Handout  05/09/23:  STS 05/27/23: - Squat with Chair and Counter Support  - 1 x daily - 7 x weekly - 2 sets - 10 reps - Heel Toe Raises with Counter Support  - 1 x daily - 7 x weekly - 3 sets - 10 reps ASSESSMENT:  CLINICAL IMPRESSION: Pt arrived late for apt.  Reviewed goals with the following findings:  Pt reports compliance with advanced HEP and feels she has improved by 85%.  FOTO score improved by 10 points indicating improved self perceived functional abilities.  Objective findings show:  ability to ambulate with no AD at faster cadence.  Continues to be limited with knee mobility with AROM 6-116 degrees and presents with weakness.  Pt will benefit from skilled PT intervention to address goals unmet for pain control, knee mobility and functional strengthening.  Eval:  Patient is a 72 y.o. female who was seen today for physical therapy evaluation and treatment for M17.11 (ICD-10-CM) - Primary osteoarthritis of right knee.  Patient demonstrates muscle weakness, reduced ROM, and fascial restrictions which are likely contributing to symptoms of pain and are negatively impacting patient ability to perform ADLs and functional mobility tasks. Patient will benefit from skilled physical therapy services to address these deficits to reduce pain and improve level of function with ADLs and functional mobility tasks.   OBJECTIVE IMPAIRMENTS: Abnormal gait, decreased activity tolerance, decreased endurance, decreased mobility, difficulty  walking, decreased ROM, decreased strength, hypomobility, increased edema, increased fascial restrictions, impaired perceived functional ability, impaired flexibility, and pain.   ACTIVITY LIMITATIONS: carrying, lifting, bending, sitting, standing, squatting, sleeping, stairs, transfers, bed mobility, and locomotion level  PARTICIPATION LIMITATIONS: meal prep, cleaning, laundry, driving, shopping, community activity, and yard work  [facility] POTENTIAL: Good  CLINICAL DECISION MAKING: Stable/uncomplicated  EVALUATION COMPLEXITY: Low   GOALS: Goals reviewed with patient? No  SHORT TERM GOALS: Target date: 05/18/2023 patient will be independent with initial HEP  Baseline:  05/27/23:  Reports compliance with HEP Goal status: MET  2.  Patient will self report 30% improvement to improve tolerance for functional activity  Baseline:  05/27/23:  Reports improvements by 85% Goal status: 05/27/23:  Reports improvements by 85%   LONG TERM GOALS: Target date: 06/01/2023  Patient will be independent in self management strategies to improve quality of life and functional outcomes.  Baseline: 05/27/23:  Reports improvements by 85% Goal status: MET   2.  Patient will self report 50% improvement to improve tolerance for functional activity  Baseline: 05/27/23:  Reports improvements by 85% Goal status: IN PROGRESS  3.  Patient will increase right knee mobility to -5 to 120 to promote normal navigation of steps; step over step pattern  Baseline: 05/27/23: 6-116 degrees Goal status: IN PROGRESS  4.  Patient will increase right leg MMTs to 5/5 without pain to promote return to ambulation community distances with minimal deviation.  Baseline: 05/27/23:  see above Goal status: IN PROGRESS  5.  Patient will increase distance on to 225 ft with LRAD  to demonstrate improved functional mobility walking household and community distances.   Baseline: 178 ft 05/27/23:  242ft no AD Goal status: IN  PROGRESS  6.  Patient will improve 5 times sit to stand score from 20.55 sec to 15 sec without UE assist  to demonstrate improved functional mobility and increased lower extremity strength.  Baseline: 05/27/23:  5 sts 11.75" Goal status: IN PROGRESS  7.  Patient will improve FOTO by 10 points  Baseline: 50  Goal status: IN PROGRESS   PLAN:  PT FREQUENCY: 2x/week  PT DURATION: 4 weeks  PLANNED INTERVENTIONS: Therapeutic exercises, Therapeutic activity, Neuromuscular re-education, Balance training, Gait training, Patient/Family education, Joint manipulation, Joint mobilization, Stair training, Orthotic/Fit training, DME instructions, Aquatic Therapy, Dry Needling, Electrical stimulation, Spinal manipulation, Spinal mobilization, Cryotherapy, Moist heat, Compression bandaging, scar mobilization, Splintting, Taping, Traction, Ultrasound, Ionotophoresis 4mg /ml Dexamethasone, and Manual therapy   PLAN FOR NEXT SESSION: Progress right knee mobility and strength as able; gait, balance as appropriate.  Continue for 4 more weeks to address goals unmet.  Becky Sax, LPTA/CLT; CBIS [phone]  Juel Burrow, PTA 05/27/2023, 3:33 PM  3:33 PM, 05/27/23

## 2023-05-28 NOTE — Addendum Note (Signed)
Addended byBurnadette Peter,  S on: 05/28/2023 11:29 AM   Modules accepted: Orders

## 2023-05-30 ENCOUNTER — Ambulatory Visit (HOSPITAL_COMMUNITY): Payer: Medicare Other

## 2023-05-30 DIAGNOSIS — M25561 Pain in right knee: Secondary | ICD-10-CM | POA: Diagnosis not present

## 2023-05-30 DIAGNOSIS — R262 Difficulty in walking, not elsewhere classified: Secondary | ICD-10-CM | POA: Diagnosis not present

## 2023-05-30 DIAGNOSIS — G8929 Other chronic pain: Secondary | ICD-10-CM | POA: Diagnosis not present

## 2023-05-30 DIAGNOSIS — M1711 Unilateral primary osteoarthritis, right knee: Secondary | ICD-10-CM | POA: Diagnosis not present

## 2023-05-30 DIAGNOSIS — M25661 Stiffness of right knee, not elsewhere classified: Secondary | ICD-10-CM | POA: Diagnosis not present

## 2023-05-30 NOTE — Therapy (Signed)
OUTPATIENT PHYSICAL THERAPY LOWER EXTREMITY TREATMENT   Patient Name: Kelly Fox MRN: [id number] DOB:1950-11-21, 72 y.o., female Today's Date: 05/30/2023  END OF SESSION:  PT End of Session - 05/30/23 1356     Visit Number 9    Number of Visits 16    Date for PT Re-Evaluation 06/24/23    Authorization Type [employer]    Progress Note Due on Visit 16    PT Start Time 1355   late arrival   PT Stop Time 1430    PT Time Calculation (min) 35 min    Activity Tolerance Patient tolerated treatment well    Behavior During Therapy [facility] for tasks assessed/performed                  Past Medical History:  Diagnosis Date   Asthma    Past Surgical History:  Procedure Laterality Date   DENTAL SURGERY     KNEE ARTHROSCOPY Left 10/29/2022   Procedure: ARTHROSCOPY KNEE LEFT FOR SCAR TISSUE DEBRIDEMENT;  Surgeon: Vickki Hearing, MD;  Location: [facility];  Service: Orthopedics;  Laterality: Left;   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/02/2022   Procedure: TOTAL KNEE ARTHROPLASTY;  Surgeon: Vickki Hearing, MD;  Location: [facility];  Service: Orthopedics;  Laterality: Left;   TOTAL KNEE ARTHROPLASTY Right 04/19/2023   Procedure: TOTAL KNEE ARTHROPLASTY;  Surgeon: Vickki Hearing, MD;  Location: [facility];  Service: Orthopedics;  Laterality: Right;   Patient Active Problem List   Diagnosis Date Noted   Primary osteoarthritis of right knee 04/19/2023   Primary localized osteoarthritis of right knee 04/19/2023   Status post total left knee replacement February 02, 2022 01/28/2023   Patellar clunk syndrome of left knee 10/29/2022   Osteoarthritis of left knee 02/02/2022   Unilateral primary osteoarthritis, left knee    Elevated blood-pressure reading without diagnosis of hypertension 12/03/2021   Seasonal allergic rhinitis 12/03/2021    PCP: Nita Sells, MD  REFERRING PROVIDER: Vickki Hearing, MD  REFERRING DIAG: M17.11 (ICD-10-CM) - Primary osteoarthritis of right knee  THERAPY  DIAG:  Stiffness of right knee, not elsewhere classified  Chronic pain of right knee  Difficulty in walking, not elsewhere classified  Osteoarthritis of right knee, unspecified osteoarthritis type  Rationale for Evaluation and Treatment: Rehabilitation  ONSET DATE: 04/19/23  SUBJECTIVE:   SUBJECTIVE STATEMENT: Late arrival today; pain right knee 7/10 today; leaned on knee to close blinds and to get and out of tub. Is pretty active at home. Able to do some work in kitchen without using cane.  Started prednisone yesterday for possible pinched nerve in back   Eval:  Patient with right knee OA for long time; had left knee done April of last year; s/p R TKA 04/18/23 per Dr. Romeo Apple at [facility]. Had home health up until yesterday.  CPM  PERTINENT HISTORY: Left knee TKA April 23 PAIN:  Are you having pain? Yes: NPRS scale: 5/10 Pain location: right knee Pain description: really sore Aggravating factors: being still, bending Relieving factors: massage, pain meds  PRECAUTIONS: None  WEIGHT BEARING RESTRICTIONS: No  FALLS:  Has patient fallen in last 6 months? No  OCCUPATION: retired  PLOF: Independent  PATIENT GOALS: walk straight without a limp or pain  NEXT MD VISIT: 7/11 staple removal   OBJECTIVE:   DIAGNOSTIC FINDINGS:  CLINICAL DATA:  Status post right total knee replacement   EXAM: PORTABLE RIGHT KNEE - 1-2 VIEW   COMPARISON:  03/28/2023   FINDINGS: Changes of  right knee replacement. No hardware or bony complicating feature. Soft tissue and joint space gas noted. Anterior skin staples.   IMPRESSION: Right knee replacement.  No visible complicating feature.    PATIENT SURVEYS:  FOTO 50  COGNITION: Overall cognitive status: Within functional limits for tasks assessed     SENSATION: WFL  EDEMA:  Yes normal for this time s/p; noted bruising right lower leg especially   PALPATION: General soreness right knee  LOWER EXTREMITY ROM:  Active ROM  Right eval Left eval Right 05/09/23 Right 05/23/23  Hip flexion      Hip extension      Hip abduction      Hip adduction      Hip internal rotation      Hip external rotation      Knee flexion 100  108 115  Knee extension -16  8 Lacking 6  Ankle dorsiflexion      Ankle plantarflexion      Ankle inversion      Ankle eversion       (Blank rows = not tested)  LOWER EXTREMITY MMT:  MMT Right eval Left eval Right 05/23/23 05/27/23: Right  Hip flexion 3-  4+   Hip extension    3+  Hip abduction    4/5   Hip adduction      Hip internal rotation      Hip external rotation      Knee flexion   4+ (isometric midrange) 4+ prone  Knee extension 2+  4+   Ankle dorsiflexion 3+  4+   Ankle plantarflexion      Ankle inversion      Ankle eversion       (Blank rows = not tested)  FUNCTIONAL TESTS:  5 times sit to stand: 20.55 sec with UE assist 2 minute walk test: 178 ft with RW  GAIT: Distance walked: 178 ft Assistive device utilized: Environmental consultant - 2 wheeled Level of assistance: SBA Comments: antalgic gait; decreased stance right LE   TODAY'S TREATMENT:                                                                                                                              DATE:  05/30/23 Bike seat 8 full revolution x 5' dynamic warm up  Heel/toe raises x 20 Slant board 5 x 20" 12" box knee drives for knee flexion x 2' Hamstring stretch on 12" box 5 x 20" Squats to chair target 2 x 10 4" step ups  right leg leading 2 x 10 GTB TKE's x 20   05/27/23: Bike seat 8 full revolution x 5' dynamic warm up 268ft no AD MMT see above ROM measurement 6-116 degrees 5STS 18.03", 2nd set 11.75" no HHA Reciprocal pattern stairs 7in 5RT  05/25/23 Bike seat 10 full revolution x 5' dynamic warm up Standing: Heel toe raises x 20 Slant board  3x 30" 12" step knee drives for flexion 10X10"  holds 12" step hamstring stretch 3 x 30" Supine:  manual soft tissue and MFR with  elevation  05/23/23 Bike seat 10 full revolution x 5' dynamic warm up  Standing: Heel toe raises x 20 Slant board 5 x 20" 12" step knee drives for flexion x 2\' 12"  step hamstring stretch 5 x 10"  Seated hamstring stretch 5 x 20"  AROM right knee -6 to 115 MMT's see above grossly 4+/5    05/18/23: Bike seat 10 full revolution x5 min Ambulation with SPC around clinic 226 feet Standing: Knee drive 47W29" on 12" step  Heel raise 15x  Slant board 3x 30"  Rt knee flexion 10X  4" lunge forward without UE assit Prone: Knee hang x 10 with soft tissue massage  05/16/23: Bike seat 10 full revolution x5 min Standing: Knee drive 56O13" on 12" step  Heel raise 15x  Slant board 3x 30" Prone: Knee hang x 5 with soft tissue massage  TKE 10x 5"  Hamstring curls 10x Supine: quad sets 10x 5"  Heel slides 10x  05/12/23: Bike seat 11 full revolution x5 min Standing:  Knee drive 5x 10" on 2nd step  Heel raise 15x  Toe 10x  GTB TKE 10x 5"  Slant board 3x 30" Prone:  Knee hang x 1'  TKE 10x 5"  Hamstring curls 10x Supine: quad sets 10x 5"  Heel slides 10x  AROM 6-112 degrees STS  05/09/23: Reviewed goals Educated importance of HEP Upon examination- may want to request additional steristrips due to openings on incision.  Returns to MD later today.    Supine: Quad sets 2x 10 Heel slides 10 SAQ 15x 5" SLR 10x -quad sets prior with some extension lag  Seated:  LAQ 2 x10 STS 10x  Standing: Heel and toe raises 10x  TKE with GTB 10x 5" Knee drive on 08MV step 5x 10"   05/04/23 physical therapy evaluation and HEP instruction    PATIENT EDUCATION:  Education details: Patient educated on exam findings, POC, scope of PT, HEP, and what to expect next visit. Person educated: Patient Education method: Explanation, Demonstration, and Handouts Education comprehension: verbalized understanding, returned demonstration, verbal cues required, and tactile cues required  HOME  EXERCISE PROGRAM: Access Code: [email or web address] URL: [email or web address] Date: 05/04/2023 Prepared by: [facility]  Exercises - Supine Ankle Pumps  - 2 x daily - 7 x weekly - 2 sets - 15 reps - Supine Quadricep Sets  - 2 x daily - 7 x weekly - 1 sets - 10 reps - 5 sec hold - Supine Short Arc Quad  - 2 x daily - 7 x weekly - 1 sets - 15 reps - Supine Heel Slides  - 2 x daily - 7 x weekly - 1 sets - 15 reps - Seated Long Arc Quad  - 2 x daily - 7 x weekly - 1 sets - 15 reps - Seated Knee Flexion Stretch  - 2 x daily - 7 x weekly - 1 sets - 15 reps - Seated Heel Toe Raises  - 2 x daily - 7 x weekly - 1 sets - 10 reps - Seated March  - 2 x daily - 7 x weekly - 2 sets - 10 reps  Patient Education - Total Knee Replacement Handout  05/09/23:  STS 05/27/23: - Squat with Chair and Counter Support  - 1 x daily - 7 x weekly - 2 sets - 10 reps - Heel Toe Raises with Counter Support  -  1 x daily - 7 x weekly - 3 sets - 10 reps ASSESSMENT:  CLINICAL IMPRESSION: Late arrival today.  Patient ambulates in PT gym with [facility]; needs cues to improve right heel strike with ambulation as she tends to externally rotate right leg and demonstrates decreased heel strike and toe off; knee flexion. Started with step ups for strengthening today without issue.   Pt will benefit from skilled PT intervention to address goals unmet for pain control, knee mobility and functional strengthening.  Eval:  Patient is a 72 y.o. female who was seen today for physical therapy evaluation and treatment for M17.11 (ICD-10-CM) - Primary osteoarthritis of right knee.  Patient demonstrates muscle weakness, reduced ROM, and fascial restrictions which are likely contributing to symptoms of pain and are negatively impacting patient ability to perform ADLs and functional mobility tasks. Patient will benefit from skilled physical therapy services to address these deficits to reduce pain and improve level of function with ADLs and functional  mobility tasks.   OBJECTIVE IMPAIRMENTS: Abnormal gait, decreased activity tolerance, decreased endurance, decreased mobility, difficulty walking, decreased ROM, decreased strength, hypomobility, increased edema, increased fascial restrictions, impaired perceived functional ability, impaired flexibility, and pain.   ACTIVITY LIMITATIONS: carrying, lifting, bending, sitting, standing, squatting, sleeping, stairs, transfers, bed mobility, and locomotion level  PARTICIPATION LIMITATIONS: meal prep, cleaning, laundry, driving, shopping, community activity, and yard work  [facility] POTENTIAL: Good  CLINICAL DECISION MAKING: Stable/uncomplicated  EVALUATION COMPLEXITY: Low   GOALS: Goals reviewed with patient? No  SHORT TERM GOALS: Target date: 05/18/2023 patient will be independent with initial HEP  Baseline:  05/27/23:  Reports compliance with HEP Goal status: MET  2.  Patient will self report 30% improvement to improve tolerance for functional activity  Baseline:  05/27/23:  Reports improvements by 85% Goal status: 05/27/23:  Reports improvements by 85%   LONG TERM GOALS: Target date: 06/01/2023  Patient will be independent in self management strategies to improve quality of life and functional outcomes.  Baseline: 05/27/23:  Reports improvements by 85% Goal status: MET   2.  Patient will self report 50% improvement to improve tolerance for functional activity  Baseline: 05/27/23:  Reports improvements by 85% Goal status: IN PROGRESS  3.  Patient will increase right knee mobility to -5 to 120 to promote normal navigation of steps; step over step pattern  Baseline: 05/27/23: 6-116 degrees Goal status: IN PROGRESS  4.  Patient will increase right leg MMTs to 5/5 without pain to promote return to ambulation community distances with minimal deviation.  Baseline: 05/27/23:  see above Goal status: IN PROGRESS  5.  Patient will increase distance on to 225 ft with LRAD  to demonstrate  improved functional mobility walking household and community distances.   Baseline: 178 ft 05/27/23:  262ft no AD Goal status: IN PROGRESS  6.  Patient will improve 5 times sit to stand score from 20.55 sec to 15 sec without UE assist  to demonstrate improved functional mobility and increased lower extremity strength.  Baseline: 05/27/23:  5 sts 11.75" Goal status: IN PROGRESS  7.  Patient will improve FOTO by 10 points  Baseline: 50  Goal status: IN PROGRESS   PLAN:  PT FREQUENCY: 2x/week  PT DURATION: 4 weeks  PLANNED INTERVENTIONS: Therapeutic exercises, Therapeutic activity, Neuromuscular re-education, Balance training, Gait training, Patient/Family education, Joint manipulation, Joint mobilization, Stair training, Orthotic/Fit training, DME instructions, Aquatic Therapy, Dry Needling, Electrical stimulation, Spinal manipulation, Spinal mobilization, Cryotherapy, Moist heat, Compression bandaging, scar mobilization, Splintting,  Taping, Traction, Ultrasound, Ionotophoresis 4mg /ml Dexamethasone, and Manual therapy   PLAN FOR NEXT SESSION: Progress right knee mobility and strength as able; gait, balance as appropriate.  Continue for 4 more weeks to address goals unmet.   1:58 PM, 05/30/23    Small  MPT [place] [facility] [place] [phone]

## 2023-06-01 ENCOUNTER — Encounter (HOSPITAL_COMMUNITY): Payer: Self-pay

## 2023-06-01 ENCOUNTER — Ambulatory Visit (HOSPITAL_COMMUNITY): Payer: Medicare Other

## 2023-06-01 DIAGNOSIS — G8929 Other chronic pain: Secondary | ICD-10-CM | POA: Diagnosis not present

## 2023-06-01 DIAGNOSIS — M25661 Stiffness of right knee, not elsewhere classified: Secondary | ICD-10-CM

## 2023-06-01 DIAGNOSIS — M25561 Pain in right knee: Secondary | ICD-10-CM | POA: Diagnosis not present

## 2023-06-01 DIAGNOSIS — R262 Difficulty in walking, not elsewhere classified: Secondary | ICD-10-CM | POA: Diagnosis not present

## 2023-06-01 DIAGNOSIS — M1711 Unilateral primary osteoarthritis, right knee: Secondary | ICD-10-CM | POA: Diagnosis not present

## 2023-06-01 NOTE — Therapy (Signed)
OUTPATIENT PHYSICAL THERAPY LOWER EXTREMITY TREATMENT   Patient Name: Kelly Fox MRN: [id number] DOB:[birth year], 72 y.o., female Today's Date: 06/01/2023  END OF SESSION:  PT End of Session - 06/01/23 1345     Visit Number 10    Number of Visits 16    Date for PT Re-Evaluation 06/24/23    Progress Note Due on Visit 16    PT Start Time 1302    PT Stop Time 1343    PT Time Calculation (min) 41 min    Activity Tolerance Patient tolerated treatment well    Behavior During Therapy [facility] for tasks assessed/performed                   Past Medical History:  Diagnosis Date   Asthma    Past Surgical History:  Procedure Laterality Date   DENTAL SURGERY     KNEE ARTHROSCOPY Left 10/29/2022   Procedure: ARTHROSCOPY KNEE LEFT FOR SCAR TISSUE DEBRIDEMENT;  Surgeon: Vickki Hearing, MD;  Location: [facility];  Service: Orthopedics;  Laterality: Left;   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/02/2022   Procedure: TOTAL KNEE ARTHROPLASTY;  Surgeon: Vickki Hearing, MD;  Location: [facility];  Service: Orthopedics;  Laterality: Left;   TOTAL KNEE ARTHROPLASTY Right 04/19/2023   Procedure: TOTAL KNEE ARTHROPLASTY;  Surgeon: Vickki Hearing, MD;  Location: [facility];  Service: Orthopedics;  Laterality: Right;   Patient Active Problem List   Diagnosis Date Noted   Primary osteoarthritis of right knee 04/19/2023   Primary localized osteoarthritis of right knee 04/19/2023   Status post total left knee replacement February 02, 2022 01/28/2023   Patellar clunk syndrome of left knee 10/29/2022   Osteoarthritis of left knee 02/02/2022   Unilateral primary osteoarthritis, left knee    Elevated blood-pressure reading without diagnosis of hypertension 12/03/2021   Seasonal allergic rhinitis 12/03/2021    PCP: Nita Sells, MD  REFERRING PROVIDER: Vickki Hearing, MD  REFERRING DIAG: M17.11 (ICD-10-CM) - Primary osteoarthritis of right knee  THERAPY DIAG:  Stiffness of right knee, not elsewhere  classified  Chronic pain of right knee  Difficulty in walking, not elsewhere classified  Rationale for Evaluation and Treatment: Rehabilitation  ONSET DATE: 04/19/23  SUBJECTIVE:   SUBJECTIVE STATEMENT: Pt stated she has increased pain Rt knee today, feels the weather may be playing a part.   Eval:  Patient with right knee OA for long time; had left knee done April of last year; s/p R TKA 04/18/23 per Dr. Romeo Apple at [facility]. Had home health up until yesterday.  CPM  PERTINENT HISTORY: Left knee TKA April 23 PAIN:  Are you having pain? Yes: NPRS scale: 7/10 Pain location: right knee Pain description: really sore Aggravating factors: being still, bending Relieving factors: massage, pain meds  PRECAUTIONS: None  WEIGHT BEARING RESTRICTIONS: No  FALLS:  Has patient fallen in last 6 months? No  OCCUPATION: retired  PLOF: Independent  PATIENT GOALS: walk straight without a limp or pain  NEXT MD VISIT: 7/11 staple removal   OBJECTIVE:   DIAGNOSTIC FINDINGS:  CLINICAL DATA:  Status post right total knee replacement   EXAM: PORTABLE RIGHT KNEE - 1-2 VIEW   COMPARISON:  03/28/2023   FINDINGS: Changes of right knee replacement. No hardware or bony complicating feature. Soft tissue and joint space gas noted. Anterior skin staples.   IMPRESSION: Right knee replacement.  No visible complicating feature.    PATIENT SURVEYS:  FOTO 50  COGNITION: Overall cognitive status: Within functional limits  for tasks assessed     SENSATION: WFL  EDEMA:  Yes normal for this time s/p; noted bruising right lower leg especially   PALPATION: General soreness right knee  LOWER EXTREMITY ROM:  Active ROM Right eval Left eval Right 05/09/23 Right 05/23/23  Hip flexion      Hip extension      Hip abduction      Hip adduction      Hip internal rotation      Hip external rotation      Knee flexion 100  108 115  Knee extension -16  8 Lacking 6  Ankle dorsiflexion       Ankle plantarflexion      Ankle inversion      Ankle eversion       (Blank rows = not tested)  LOWER EXTREMITY MMT:  MMT Right eval Left eval Right 05/23/23 05/27/23: Right  Hip flexion 3-  4+   Hip extension    3+  Hip abduction    4/5   Hip adduction      Hip internal rotation      Hip external rotation      Knee flexion   4+ (isometric midrange) 4+ prone  Knee extension 2+  4+   Ankle dorsiflexion 3+  4+   Ankle plantarflexion      Ankle inversion      Ankle eversion       (Blank rows = not tested)  FUNCTIONAL TESTS:  5 times sit to stand: 20.55 sec with UE assist 2 minute walk test: 178 ft with RW  GAIT: Distance walked: 178 ft Assistive device utilized: Environmental consultant - 2 wheeled Level of assistance: SBA Comments: antalgic gait; decreased stance right LE   TODAY'S TREATMENT:                                                                                                                              DATE:  06/01/23 Bike seat 8 full revolution x 5' dynamic warm up Heel walking 1RT Squat to heel raise 2x 10 front of chair with cueingfor mechanics TKE GTB x 20x Leg press 2x 10 3Pl 4in step up 2x 10 Rt LE 4in lateral step up Rt LE 15x 12in hamstring stretch 3x 30" Slant board 3x 30" Bodycraft walk out 3Pl 5RT retro and sidestep  05/30/23 Bike seat 8 full revolution x 5' dynamic warm up  Heel/toe raises x 20 Slant board 5 x 20" 12" box knee drives for knee flexion x 2' Hamstring stretch on 12" box 5 x 20" Squats to chair target 2 x 10 4" step ups  right leg leading 2 x 10 GTB TKE's x 20   05/27/23: Bike seat 8 full revolution x 5' dynamic warm up 265ft no AD MMT see above ROM measurement 6-116 degrees 5STS 18.03", 2nd set 11.75" no HHA Reciprocal pattern stairs 7in 5RT  05/25/23 Bike seat 10 full revolution x 5'  dynamic warm up Standing: Heel toe raises x 20 Slant board  3x 30" 12" step knee drives for flexion 10X10" holds 12" step hamstring stretch 3  x 30" Supine:  manual soft tissue and MFR with elevation  05/23/23 Bike seat 10 full revolution x 5' dynamic warm up  Standing: Heel toe raises x 20 Slant board 5 x 20" 12" step knee drives for flexion x 2\' 12"  step hamstring stretch 5 x 10"  Seated hamstring stretch 5 x 20"  AROM right knee -6 to 115 MMT's see above grossly 4+/5    05/18/23: Bike seat 10 full revolution x5 min Ambulation with SPC around clinic 226 feet Standing: Knee drive 96E95" on 12" step  Heel raise 15x  Slant board 3x 30"  Rt knee flexion 10X  4" lunge forward without UE assit Prone: Knee hang x 10 with soft tissue massage  05/16/23: Bike seat 10 full revolution x5 min Standing: Knee drive 28U13" on 12" step  Heel raise 15x  Slant board 3x 30" Prone: Knee hang x 5 with soft tissue massage  TKE 10x 5"  Hamstring curls 10x Supine: quad sets 10x 5"  Heel slides 10x  05/12/23: Bike seat 11 full revolution x5 min Standing:  Knee drive 5x 10" on 2nd step  Heel raise 15x  Toe 10x  GTB TKE 10x 5"  Slant board 3x 30" Prone:  Knee hang x 1'  TKE 10x 5"  Hamstring curls 10x Supine: quad sets 10x 5"  Heel slides 10x  AROM 6-112 degrees STS  05/09/23: Reviewed goals Educated importance of HEP Upon examination- may want to request additional steristrips due to openings on incision.  Returns to MD later today.    Supine: Quad sets 2x 10 Heel slides 10 SAQ 15x 5" SLR 10x -quad sets prior with some extension lag  Seated:  LAQ 2 x10 STS 10x  Standing: Heel and toe raises 10x  TKE with GTB 10x 5" Knee drive on 24MW step 5x 10"   05/04/23 physical therapy evaluation and HEP instruction    PATIENT EDUCATION:  Education details: Patient educated on exam findings, POC, scope of PT, HEP, and what to expect next visit. Person educated: Patient Education method: Explanation, Demonstration, and Handouts Education comprehension: verbalized understanding, returned demonstration, verbal cues  required, and tactile cues required  HOME EXERCISE PROGRAM: Access Code: [email or web address] URL: [email or web address] Date: 05/04/2023 Prepared by: [facility]  Exercises - Supine Ankle Pumps  - 2 x daily - 7 x weekly - 2 sets - 15 reps - Supine Quadricep Sets  - 2 x daily - 7 x weekly - 1 sets - 10 reps - 5 sec hold - Supine Short Arc Quad  - 2 x daily - 7 x weekly - 1 sets - 15 reps - Supine Heel Slides  - 2 x daily - 7 x weekly - 1 sets - 15 reps - Seated Long Arc Quad  - 2 x daily - 7 x weekly - 1 sets - 15 reps - Seated Knee Flexion Stretch  - 2 x daily - 7 x weekly - 1 sets - 15 reps - Seated Heel Toe Raises  - 2 x daily - 7 x weekly - 1 sets - 10 reps - Seated March  - 2 x daily - 7 x weekly - 2 sets - 10 reps  Patient Education - Total Knee Replacement Handout  05/09/23:  STS 05/27/23: - Squat with Chair and Counter Support  - 1  x daily - 7 x weekly - 2 sets - 10 reps - Heel Toe Raises with Counter Support  - 1 x daily - 7 x weekly - 3 sets - 10 reps ASSESSMENT:  CLINICAL IMPRESSION: Session focus with knee mobility and functional strengthening.  Added lateral step up, heel walking and leg press for quad and gluteal strengthening.  Pt limited by pain, monitored through session with no reports of increased pain.  Pt arrived without AD, educated benefits of walking with AD for pain control with verbalized understanding.  Eval:  Patient is a 72 y.o. female who was seen today for physical therapy evaluation and treatment for M17.11 (ICD-10-CM) - Primary osteoarthritis of right knee.  Patient demonstrates muscle weakness, reduced ROM, and fascial restrictions which are likely contributing to symptoms of pain and are negatively impacting patient ability to perform ADLs and functional mobility tasks. Patient will benefit from skilled physical therapy services to address these deficits to reduce pain and improve level of function with ADLs and functional mobility tasks.   OBJECTIVE  IMPAIRMENTS: Abnormal gait, decreased activity tolerance, decreased endurance, decreased mobility, difficulty walking, decreased ROM, decreased strength, hypomobility, increased edema, increased fascial restrictions, impaired perceived functional ability, impaired flexibility, and pain.   ACTIVITY LIMITATIONS: carrying, lifting, bending, sitting, standing, squatting, sleeping, stairs, transfers, bed mobility, and locomotion level  PARTICIPATION LIMITATIONS: meal prep, cleaning, laundry, driving, shopping, community activity, and yard work  [facility] POTENTIAL: Good  CLINICAL DECISION MAKING: Stable/uncomplicated  EVALUATION COMPLEXITY: Low   GOALS: Goals reviewed with patient? No  SHORT TERM GOALS: Target date: 05/18/2023 patient will be independent with initial HEP  Baseline:  05/27/23:  Reports compliance with HEP Goal status: MET  2.  Patient will self report 30% improvement to improve tolerance for functional activity  Baseline:  05/27/23:  Reports improvements by 85% Goal status: 05/27/23:  Reports improvements by 85%   LONG TERM GOALS: Target date: 06/01/2023  Patient will be independent in self management strategies to improve quality of life and functional outcomes.  Baseline: 05/27/23:  Reports improvements by 85% Goal status: MET   2.  Patient will self report 50% improvement to improve tolerance for functional activity  Baseline: 05/27/23:  Reports improvements by 85% Goal status: IN PROGRESS  3.  Patient will increase right knee mobility to -5 to 120 to promote normal navigation of steps; step over step pattern  Baseline: 05/27/23: 6-116 degrees Goal status: IN PROGRESS  4.  Patient will increase right leg MMTs to 5/5 without pain to promote return to ambulation community distances with minimal deviation.  Baseline: 05/27/23:  see above Goal status: IN PROGRESS  5.  Patient will increase distance on to 225 ft with LRAD  to demonstrate improved functional mobility  walking household and community distances.   Baseline: 178 ft 05/27/23:  230ft no AD Goal status: IN PROGRESS  6.  Patient will improve 5 times sit to stand score from 20.55 sec to 15 sec without UE assist  to demonstrate improved functional mobility and increased lower extremity strength.  Baseline: 05/27/23:  5 sts 11.75" Goal status: IN PROGRESS  7.  Patient will improve FOTO by 10 points  Baseline: 50  Goal status: IN PROGRESS   PLAN:  PT FREQUENCY: 2x/week  PT DURATION: 4 weeks  PLANNED INTERVENTIONS: Therapeutic exercises, Therapeutic activity, Neuromuscular re-education, Balance training, Gait training, Patient/Family education, Joint manipulation, Joint mobilization, Stair training, Orthotic/Fit training, DME instructions, Aquatic Therapy, Dry Needling, Electrical stimulation, Spinal manipulation, Spinal mobilization, Cryotherapy,  Moist heat, Compression bandaging, scar mobilization, Splintting, Taping, Traction, Ultrasound, Ionotophoresis 4mg /ml Dexamethasone, and Manual therapy   PLAN FOR NEXT SESSION: Progress right knee mobility and strength as able; gait, balance as appropriate.  Continue for 4 more weeks to address goals unmet.   Becky Sax, LPTA/CLT; CBIS [phone]  Juel Burrow, PTA 06/01/2023, 4:33 PM   4:33 PM, 06/01/23

## 2023-06-03 ENCOUNTER — Ambulatory Visit (HOSPITAL_COMMUNITY): Payer: Medicare Other

## 2023-06-03 ENCOUNTER — Encounter (HOSPITAL_COMMUNITY): Payer: Self-pay

## 2023-06-03 DIAGNOSIS — G8929 Other chronic pain: Secondary | ICD-10-CM

## 2023-06-03 DIAGNOSIS — R262 Difficulty in walking, not elsewhere classified: Secondary | ICD-10-CM

## 2023-06-03 DIAGNOSIS — M25661 Stiffness of right knee, not elsewhere classified: Secondary | ICD-10-CM | POA: Diagnosis not present

## 2023-06-03 DIAGNOSIS — M1711 Unilateral primary osteoarthritis, right knee: Secondary | ICD-10-CM | POA: Diagnosis not present

## 2023-06-03 DIAGNOSIS — M25561 Pain in right knee: Secondary | ICD-10-CM | POA: Diagnosis not present

## 2023-06-03 NOTE — Therapy (Signed)
OUTPATIENT PHYSICAL THERAPY LOWER EXTREMITY TREATMENT   Patient Name: Kelly Fox MRN: [id number] DOB:[birth year], 72 y.o., female Today's Date: 06/03/2023  END OF SESSION:  PT End of Session - 06/03/23 1256     Visit Number 11    Number of Visits 16    Authorization Type [employer]    Progress Note Due on Visit 16    PT Start Time 1300    PT Stop Time 1346    PT Time Calculation (min) 46 min    Activity Tolerance Patient tolerated treatment well    Behavior During Therapy WFL for tasks assessed/performed                   Past Medical History:  Diagnosis Date   Asthma    Past Surgical History:  Procedure Laterality Date   DENTAL SURGERY     KNEE ARTHROSCOPY Left 10/29/2022   Procedure: ARTHROSCOPY KNEE LEFT FOR SCAR TISSUE DEBRIDEMENT;  Surgeon: Vickki Hearing, MD;  Location: [facility];  Service: Orthopedics;  Laterality: Left;   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/02/2022   Procedure: TOTAL KNEE ARTHROPLASTY;  Surgeon: Vickki Hearing, MD;  Location: [facility];  Service: Orthopedics;  Laterality: Left;   TOTAL KNEE ARTHROPLASTY Right 04/19/2023   Procedure: TOTAL KNEE ARTHROPLASTY;  Surgeon: Vickki Hearing, MD;  Location: [facility];  Service: Orthopedics;  Laterality: Right;   Patient Active Problem List   Diagnosis Date Noted   Primary osteoarthritis of right knee 04/19/2023   Primary localized osteoarthritis of right knee 04/19/2023   Status post total left knee replacement February 02, 2022 01/28/2023   Patellar clunk syndrome of left knee 10/29/2022   Osteoarthritis of left knee 02/02/2022   Unilateral primary osteoarthritis, left knee    Elevated blood-pressure reading without diagnosis of hypertension 12/03/2021   Seasonal allergic rhinitis 12/03/2021    PCP: Nita Sells, MD  REFERRING PROVIDER: Vickki Hearing, MD  REFERRING DIAG: M17.11 (ICD-10-CM) - Primary osteoarthritis of right knee  THERAPY DIAG:  Stiffness of right knee, not elsewhere  classified  Chronic pain of right knee  Difficulty in walking, not elsewhere classified  Osteoarthritis of right knee, unspecified osteoarthritis type  Rationale for Evaluation and Treatment: Rehabilitation  ONSET DATE: 04/19/23  SUBJECTIVE:   SUBJECTIVE STATEMENT: Pain scale 4-5/10, increased ache feels related to the weather.    Eval:  Patient with right knee OA for long time; had left knee done April of last year; s/p R TKA 04/18/23 per Dr. Romeo Apple at [facility]. Had home health up until yesterday.  CPM  PERTINENT HISTORY: Left knee TKA April 23 PAIN:  Are you having pain? Yes: NPRS scale: 7/10 Pain location: right knee Pain description: really sore Aggravating factors: being still, bending Relieving factors: massage, pain meds  PRECAUTIONS: None  WEIGHT BEARING RESTRICTIONS: No  FALLS:  Has patient fallen in last 6 months? No  OCCUPATION: retired  PLOF: Independent  PATIENT GOALS: walk straight without a limp or pain  NEXT MD VISIT: 7/11 staple removal   OBJECTIVE:   DIAGNOSTIC FINDINGS:  CLINICAL DATA:  Status post right total knee replacement   EXAM: PORTABLE RIGHT KNEE - 1-2 VIEW   COMPARISON:  03/28/2023   FINDINGS: Changes of right knee replacement. No hardware or bony complicating feature. Soft tissue and joint space gas noted. Anterior skin staples.   IMPRESSION: Right knee replacement.  No visible complicating feature.    PATIENT SURVEYS:  FOTO 50  COGNITION: Overall cognitive status: Within functional  limits for tasks assessed     SENSATION: WFL  EDEMA:  Yes normal for this time s/p; noted bruising right lower leg especially   PALPATION: General soreness right knee  LOWER EXTREMITY ROM:  Active ROM Right eval Left eval Right 05/09/23 Right 05/23/23  Hip flexion      Hip extension      Hip abduction      Hip adduction      Hip internal rotation      Hip external rotation      Knee flexion 100  108 115  Knee extension -16  8  Lacking 6  Ankle dorsiflexion      Ankle plantarflexion      Ankle inversion      Ankle eversion       (Blank rows = not tested)  LOWER EXTREMITY MMT:  MMT Right eval Left eval Right 05/23/23 05/27/23: Right  Hip flexion 3-  4+   Hip extension    3+  Hip abduction    4/5   Hip adduction      Hip internal rotation      Hip external rotation      Knee flexion   4+ (isometric midrange) 4+ prone  Knee extension 2+  4+   Ankle dorsiflexion 3+  4+   Ankle plantarflexion      Ankle inversion      Ankle eversion       (Blank rows = not tested)  FUNCTIONAL TESTS:  5 times sit to stand: 20.55 sec with UE assist 2 minute walk test: 178 ft with RW  GAIT: Distance walked: 178 ft Assistive device utilized: Environmental consultant - 2 wheeled Level of assistance: SBA Comments: antalgic gait; decreased stance right LE   TODAY'S TREATMENT:                                                                                                                              DATE:  06/03/23: Bike seat 8 full revolution x 5' dynamic warm up Squat to heel raise 2x 10 front of chair with cueingfor mechanics Toe raise decline slope 15x TKE on Body craft 2Pl 2x 10 5" holds Bodycraft walk out 3Pl 5RT retro and sidestep 6in step up 15 Rt LE Lateral step up 4in 15x Step down 4in 15x SLS Lt 11, Rt 7" max 12in hamstring stretch 3x 30" Slant board 3x 30" Leg press 2x 10 3Pl     06/01/23 Bike seat 8 full revolution x 5' dynamic warm up Heel walking 1RT Squat to heel raise 2x 10 front of chair with cueingfor mechanics TKE GTB x 20x Leg press 2x 10 3Pl 4in step up 2x 10 Rt LE 4in lateral step up Rt LE 15x 12in hamstring stretch 3x 30" Slant board 3x 30" Bodycraft walk out 3Pl 5RT retro and sidestep  05/30/23 Bike seat 8 full revolution x 5' dynamic warm up  Heel/toe raises x 20 Slant board 5  x 20" 12" box knee drives for knee flexion x 2' Hamstring stretch on 12" box 5 x 20" Squats to chair target 2 x 10 4"  step ups  right leg leading 2 x 10 GTB TKE's x 20   05/27/23: Bike seat 8 full revolution x 5' dynamic warm up 232ft no AD MMT see above ROM measurement 6-116 degrees 5STS 18.03", 2nd set 11.75" no HHA Reciprocal pattern stairs 7in 5RT  05/25/23 Bike seat 10 full revolution x 5' dynamic warm up Standing: Heel toe raises x 20 Slant board  3x 30" 12" step knee drives for flexion 10X10" holds 12" step hamstring stretch 3 x 30" Supine:  manual soft tissue and MFR with elevation  05/23/23 Bike seat 10 full revolution x 5' dynamic warm up  Standing: Heel toe raises x 20 Slant board 5 x 20" 12" step knee drives for flexion x 2\' 12"  step hamstring stretch 5 x 10"  Seated hamstring stretch 5 x 20"  AROM right knee -6 to 115 MMT's see above grossly 4+/5    05/18/23: Bike seat 10 full revolution x5 min Ambulation with SPC around clinic 226 feet Standing: Knee drive 25K27" on 12" step  Heel raise 15x  Slant board 3x 30"  Rt knee flexion 10X  4" lunge forward without UE assit Prone: Knee hang x 10 with soft tissue massage  05/16/23: Bike seat 10 full revolution x5 min Standing: Knee drive 06C37" on 12" step  Heel raise 15x  Slant board 3x 30" Prone: Knee hang x 5 with soft tissue massage  TKE 10x 5"  Hamstring curls 10x Supine: quad sets 10x 5"  Heel slides 10x  05/12/23: Bike seat 11 full revolution x5 min Standing:  Knee drive 5x 10" on 2nd step  Heel raise 15x  Toe 10x  GTB TKE 10x 5"  Slant board 3x 30" Prone:  Knee hang x 1'  TKE 10x 5"  Hamstring curls 10x Supine: quad sets 10x 5"  Heel slides 10x  AROM 6-112 degrees STS  05/09/23: Reviewed goals Educated importance of HEP Upon examination- may want to request additional steristrips due to openings on incision.  Returns to MD later today.    Supine: Quad sets 2x 10 Heel slides 10 SAQ 15x 5" SLR 10x -quad sets prior with some extension lag  Seated:  LAQ 2 x10 STS 10x  Standing: Heel and  toe raises 10x  TKE with GTB 10x 5" Knee drive on 62GB step 5x 10"   05/04/23 physical therapy evaluation and HEP instruction    PATIENT EDUCATION:  Education details: Patient educated on exam findings, POC, scope of PT, HEP, and what to expect next visit. Person educated: Patient Education method: Explanation, Demonstration, and Handouts Education comprehension: verbalized understanding, returned demonstration, verbal cues required, and tactile cues required  HOME EXERCISE PROGRAM: Access Code: [email or web address] URL: [email or web address] Date: 05/04/2023 Prepared by: [facility]  Exercises - Supine Ankle Pumps  - 2 x daily - 7 x weekly - 2 sets - 15 reps - Supine Quadricep Sets  - 2 x daily - 7 x weekly - 1 sets - 10 reps - 5 sec hold - Supine Short Arc Quad  - 2 x daily - 7 x weekly - 1 sets - 15 reps - Supine Heel Slides  - 2 x daily - 7 x weekly - 1 sets - 15 reps - Seated Long Arc Quad  - 2 x daily - 7 x weekly - 1  sets - 15 reps - Seated Knee Flexion Stretch  - 2 x daily - 7 x weekly - 1 sets - 15 reps - Seated Heel Toe Raises  - 2 x daily - 7 x weekly - 1 sets - 10 reps - Seated March  - 2 x daily - 7 x weekly - 2 sets - 10 reps  Patient Education - Total Knee Replacement Handout  05/09/23:  STS 05/27/23: - Squat with Chair and Counter Support  - 1 x daily - 7 x weekly - 2 sets - 10 reps - Heel Toe Raises with Counter Support  - 1 x daily - 7 x weekly - 3 sets - 10 reps  06/03/23: SLS ASSESSMENT:  CLINICAL IMPRESSION: Continued session focus with knee mobilty and functional strengthening.  Able to increased step up height with good control noted.  Added Bodycraft TKE for quad strengthening.  Also added SLS for hip stability which was difficult, added to HEP.  Eval:  Patient is a 72 y.o. female who was seen today for physical therapy evaluation and treatment for M17.11 (ICD-10-CM) - Primary osteoarthritis of right knee.  Patient demonstrates muscle weakness, reduced  ROM, and fascial restrictions which are likely contributing to symptoms of pain and are negatively impacting patient ability to perform ADLs and functional mobility tasks. Patient will benefit from skilled physical therapy services to address these deficits to reduce pain and improve level of function with ADLs and functional mobility tasks.   OBJECTIVE IMPAIRMENTS: Abnormal gait, decreased activity tolerance, decreased endurance, decreased mobility, difficulty walking, decreased ROM, decreased strength, hypomobility, increased edema, increased fascial restrictions, impaired perceived functional ability, impaired flexibility, and pain.   ACTIVITY LIMITATIONS: carrying, lifting, bending, sitting, standing, squatting, sleeping, stairs, transfers, bed mobility, and locomotion level  PARTICIPATION LIMITATIONS: meal prep, cleaning, laundry, driving, shopping, community activity, and yard work  [facility] POTENTIAL: Good  CLINICAL DECISION MAKING: Stable/uncomplicated  EVALUATION COMPLEXITY: Low   GOALS: Goals reviewed with patient? No  SHORT TERM GOALS: Target date: 05/18/2023 patient will be independent with initial HEP  Baseline:  05/27/23:  Reports compliance with HEP Goal status: MET  2.  Patient will self report 30% improvement to improve tolerance for functional activity  Baseline:  05/27/23:  Reports improvements by 85% Goal status: 05/27/23:  Reports improvements by 85%   LONG TERM GOALS: Target date: 06/01/2023  Patient will be independent in self management strategies to improve quality of life and functional outcomes.  Baseline: 05/27/23:  Reports improvements by 85% Goal status: MET   2.  Patient will self report 50% improvement to improve tolerance for functional activity  Baseline: 05/27/23:  Reports improvements by 85% Goal status: IN PROGRESS  3.  Patient will increase right knee mobility to -5 to 120 to promote normal navigation of steps; step over step pattern  Baseline:  05/27/23: 6-116 degrees Goal status: IN PROGRESS  4.  Patient will increase right leg MMTs to 5/5 without pain to promote return to ambulation community distances with minimal deviation.  Baseline: 05/27/23:  see above Goal status: IN PROGRESS  5.  Patient will increase distance on to 225 ft with LRAD  to demonstrate improved functional mobility walking household and community distances.   Baseline: 178 ft 05/27/23:  267ft no AD Goal status: IN PROGRESS  6.  Patient will improve 5 times sit to stand score from 20.55 sec to 15 sec without UE assist  to demonstrate improved functional mobility and increased lower extremity strength.  Baseline: 05/27/23:  5 sts 11.75" Goal status: IN PROGRESS  7.  Patient will improve FOTO by 10 points  Baseline: 50  Goal status: IN PROGRESS   PLAN:  PT FREQUENCY: 2x/week  PT DURATION: 4 weeks  PLANNED INTERVENTIONS: Therapeutic exercises, Therapeutic activity, Neuromuscular re-education, Balance training, Gait training, Patient/Family education, Joint manipulation, Joint mobilization, Stair training, Orthotic/Fit training, DME instructions, Aquatic Therapy, Dry Needling, Electrical stimulation, Spinal manipulation, Spinal mobilization, Cryotherapy, Moist heat, Compression bandaging, scar mobilization, Splintting, Taping, Traction, Ultrasound, Ionotophoresis 4mg /ml Dexamethasone, and Manual therapy   PLAN FOR NEXT SESSION: Progress right knee mobility and strength as able; gait, balance as appropriate.  Continue for 4 more weeks to address goals unmet.   Becky Sax, LPTA/CLT; CBIS [phone]  Juel Burrow, PTA 06/03/2023, 1:47 PM   1:47 PM, 06/03/23

## 2023-06-06 ENCOUNTER — Encounter (HOSPITAL_COMMUNITY): Payer: Medicare Other | Admitting: Physical Therapy

## 2023-06-08 ENCOUNTER — Ambulatory Visit (HOSPITAL_COMMUNITY): Payer: Medicare Other | Admitting: Physical Therapy

## 2023-06-08 DIAGNOSIS — R262 Difficulty in walking, not elsewhere classified: Secondary | ICD-10-CM

## 2023-06-08 DIAGNOSIS — G8929 Other chronic pain: Secondary | ICD-10-CM

## 2023-06-08 DIAGNOSIS — M25561 Pain in right knee: Secondary | ICD-10-CM | POA: Diagnosis not present

## 2023-06-08 DIAGNOSIS — M25661 Stiffness of right knee, not elsewhere classified: Secondary | ICD-10-CM | POA: Diagnosis not present

## 2023-06-08 DIAGNOSIS — M1711 Unilateral primary osteoarthritis, right knee: Secondary | ICD-10-CM | POA: Diagnosis not present

## 2023-06-08 NOTE — Therapy (Signed)
OUTPATIENT PHYSICAL THERAPY LOWER EXTREMITY TREATMENT   Patient Name: Kelly Fox MRN: [id number] DOB:1951-08-31, 72 y.o., female Today's Date: 06/08/2023  END OF SESSION:  PT End of Session - 06/08/23 1357     Visit Number 12    Number of Visits 16    Authorization Type [employer]    Progress Note Due on Visit 16    PT Start Time 1355    PT Stop Time 1433    PT Time Calculation (min) 38 min    Activity Tolerance Patient tolerated treatment well    Behavior During Therapy WFL for tasks assessed/performed                   Past Medical History:  Diagnosis Date   Asthma    Past Surgical History:  Procedure Laterality Date   DENTAL SURGERY     KNEE ARTHROSCOPY Left 10/29/2022   Procedure: ARTHROSCOPY KNEE LEFT FOR SCAR TISSUE DEBRIDEMENT;  Surgeon: Vickki Hearing, MD;  Location: [facility];  Service: Orthopedics;  Laterality: Left;   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/02/2022   Procedure: TOTAL KNEE ARTHROPLASTY;  Surgeon: Vickki Hearing, MD;  Location: [facility];  Service: Orthopedics;  Laterality: Left;   TOTAL KNEE ARTHROPLASTY Right 04/19/2023   Procedure: TOTAL KNEE ARTHROPLASTY;  Surgeon: Vickki Hearing, MD;  Location: [facility];  Service: Orthopedics;  Laterality: Right;   Patient Active Problem List   Diagnosis Date Noted   Primary osteoarthritis of right knee 04/19/2023   Primary localized osteoarthritis of right knee 04/19/2023   Status post total left knee replacement February 02, 2022 01/28/2023   Patellar clunk syndrome of left knee 10/29/2022   Osteoarthritis of left knee 02/02/2022   Unilateral primary osteoarthritis, left knee    Elevated blood-pressure reading without diagnosis of hypertension 12/03/2021   Seasonal allergic rhinitis 12/03/2021    PCP: Nita Sells, MD  REFERRING PROVIDER: Vickki Hearing, MD  REFERRING DIAG: M17.11 (ICD-10-CM) - Primary osteoarthritis of right knee  THERAPY DIAG:  Stiffness of right knee, not elsewhere  classified  Chronic pain of right knee  Difficulty in walking, not elsewhere classified  Rationale for Evaluation and Treatment: Rehabilitation  ONSET DATE: 04/19/23  SUBJECTIVE:   SUBJECTIVE STATEMENT: Pt states her knee is super sore, pain around 5/10.    Eval:  Patient with right knee OA for long time; had left knee done April of last year; s/p R TKA 04/18/23 per Dr. Romeo Apple at [facility]. Had home health up until yesterday.  CPM  PERTINENT HISTORY: Left knee TKA April 23 PAIN:  Are you having pain? Yes: NPRS scale: 7/10 Pain location: right knee Pain description: really sore Aggravating factors: being still, bending Relieving factors: massage, pain meds  PRECAUTIONS: None  WEIGHT BEARING RESTRICTIONS: No  FALLS:  Has patient fallen in last 6 months? No  OCCUPATION: retired  PLOF: Independent  PATIENT GOALS: walk straight without a limp or pain  NEXT MD VISIT: 7/11 staple removal   OBJECTIVE:   DIAGNOSTIC FINDINGS:  CLINICAL DATA:  Status post right total knee replacement   EXAM: PORTABLE RIGHT KNEE - 1-2 VIEW   COMPARISON:  03/28/2023   FINDINGS: Changes of right knee replacement. No hardware or bony complicating feature. Soft tissue and joint space gas noted. Anterior skin staples.   IMPRESSION: Right knee replacement.  No visible complicating feature.    PATIENT SURVEYS:  FOTO 50  COGNITION: Overall cognitive status: Within functional limits for tasks assessed  SENSATION: WFL  EDEMA:  Yes normal for this time s/p; noted bruising right lower leg especially   PALPATION: General soreness right knee  LOWER EXTREMITY ROM:  Active ROM Right eval Left eval Right 05/09/23 Right 05/23/23  Hip flexion      Hip extension      Hip abduction      Hip adduction      Hip internal rotation      Hip external rotation      Knee flexion 100  108 115  Knee extension -16  8 Lacking 6  Ankle dorsiflexion      Ankle plantarflexion      Ankle  inversion      Ankle eversion       (Blank rows = not tested)  LOWER EXTREMITY MMT:  MMT Right eval Left eval Right 05/23/23 05/27/23: Right  Hip flexion 3-  4+   Hip extension    3+  Hip abduction    4/5   Hip adduction      Hip internal rotation      Hip external rotation      Knee flexion   4+ (isometric midrange) 4+ prone  Knee extension 2+  4+   Ankle dorsiflexion 3+  4+   Ankle plantarflexion      Ankle inversion      Ankle eversion       (Blank rows = not tested)  FUNCTIONAL TESTS:  5 times sit to stand: 20.55 sec with UE assist 2 minute walk test: 178 ft with RW  GAIT: Distance walked: 178 ft Assistive device utilized: Walker - 2 wheeled Level of assistance: SBA Comments: antalgic gait; decreased stance right LE   TODAY'S TREATMENT:                                                                                                                              DATE:  06/08/23: Bike seat 8 full revolution x 5' dynamic warm up Squat to heel raise 2x 10 front of chair  TKE on Body craft 2Pl 2x 10 5" holds Bodycraft walk out 3Pl 5RT retro and sidestep Vectors 10X5" each LE with UE assist.  06/03/23: Bike seat 8 full revolution x 5' dynamic warm up Squat to heel raise 2x 10 front of chair with cueingfor mechanics Toe raise decline slope 15x TKE on Body craft 2Pl 2x 10 5" holds Bodycraft walk out 3Pl 5RT retro and sidestep 6in step up 15 Rt LE Lateral step up 4in 15x Step down 4in 15x SLS Lt 11, Rt 7" max 12in hamstring stretch 3x 30" Slant board 3x 30" Leg press 2x 10 3Pl  06/01/23 Bike seat 8 full revolution x 5' dynamic warm up Heel walking 1RT Squat to heel raise 2x 10 front of chair with cueingfor mechanics TKE GTB x 20x Leg press 2x 10 3Pl 4in step up 2x 10 Rt LE 4in lateral step up Rt LE 15x 12in hamstring  stretch 3x 30" Slant board 3x 30" Bodycraft walk out 3Pl 5RT retro and sidestep  05/30/23 Bike seat 8 full revolution x 5' dynamic warm  up  Heel/toe raises x 20 Slant board 5 x 20" 12" box knee drives for knee flexion x 2' Hamstring stretch on 12" box 5 x 20" Squats to chair target 2 x 10 4" step ups  right leg leading 2 x 10 GTB TKE's x 20   05/27/23: Bike seat 8 full revolution x 5' dynamic warm up 247ft no AD MMT see above ROM measurement 6-116 degrees 5STS 18.03", 2nd set 11.75" no HHA Reciprocal pattern stairs 7in 5RT  05/25/23 Bike seat 10 full revolution x 5' dynamic warm up Standing: Heel toe raises x 20 Slant board  3x 30" 12" step knee drives for flexion 10X10" holds 12" step hamstring stretch 3 x 30" Supine:  manual soft tissue and MFR with elevation  05/23/23 Bike seat 10 full revolution x 5' dynamic warm up  Standing: Heel toe raises x 20 Slant board 5 x 20" 12" step knee drives for flexion x 2\' 12"  step hamstring stretch 5 x 10"  Seated hamstring stretch 5 x 20"  AROM right knee -6 to 115 MMT's see above grossly 4+/5    05/18/23: Bike seat 10 full revolution x5 min Ambulation with SPC around clinic 226 feet Standing: Knee drive 16X09" on 12" step  Heel raise 15x  Slant board 3x 30"  Rt knee flexion 10X  4" lunge forward without UE assit Prone: Knee hang x 10 with soft tissue massage  05/16/23: Bike seat 10 full revolution x5 min Standing: Knee drive 60A54" on 12" step  Heel raise 15x  Slant board 3x 30" Prone: Knee hang x 5 with soft tissue massage  TKE 10x 5"  Hamstring curls 10x Supine: quad sets 10x 5"  Heel slides 10x  05/12/23: Bike seat 11 full revolution x5 min Standing:  Knee drive 5x 10" on 2nd step  Heel raise 15x  Toe 10x  GTB TKE 10x 5"  Slant board 3x 30" Prone:  Knee hang x 1'  TKE 10x 5"  Hamstring curls 10x Supine: quad sets 10x 5"  Heel slides 10x  AROM 6-112 degrees STS  05/09/23: Reviewed goals Educated importance of HEP Upon examination- may want to request additional steristrips due to openings on incision.  Returns to MD later today.     Supine: Quad sets 2x 10 Heel slides 10 SAQ 15x 5" SLR 10x -quad sets prior with some extension lag  Seated:  LAQ 2 x10 STS 10x  Standing: Heel and toe raises 10x  TKE with GTB 10x 5" Knee drive on 09WJ step 5x 10"   05/04/23 physical therapy evaluation and HEP instruction    PATIENT EDUCATION:  Education details: Patient educated on exam findings, POC, scope of PT, HEP, and what to expect next visit. Person educated: Patient Education method: Explanation, Demonstration, and Handouts Education comprehension: verbalized understanding, returned demonstration, verbal cues required, and tactile cues required  HOME EXERCISE PROGRAM: Access Code: [email or web address] URL: [email or web address] Date: 05/04/2023 Prepared by: [facility]  Exercises - Supine Ankle Pumps  - 2 x daily - 7 x weekly - 2 sets - 15 reps - Supine Quadricep Sets  - 2 x daily - 7 x weekly - 1 sets - 10 reps - 5 sec hold - Supine Short Arc Quad  - 2 x daily - 7 x weekly - 1 sets - 15 reps -  Supine Heel Slides  - 2 x daily - 7 x weekly - 1 sets - 15 reps - Seated Long Arc Quad  - 2 x daily - 7 x weekly - 1 sets - 15 reps - Seated Knee Flexion Stretch  - 2 x daily - 7 x weekly - 1 sets - 15 reps - Seated Heel Toe Raises  - 2 x daily - 7 x weekly - 1 sets - 10 reps - Seated March  - 2 x daily - 7 x weekly - 2 sets - 10 reps  Patient Education - Total Knee Replacement Handout  05/09/23:  STS 05/27/23: - Squat with Chair and Counter Support  - 1 x daily - 7 x weekly - 2 sets - 10 reps - Heel Toe Raises with Counter Support  - 1 x daily - 7 x weekly - 3 sets - 10 reps  06/03/23: SLS ASSESSMENT:  CLINICAL IMPRESSION: Pt with late arrival limiting session today.  Continued focus on functional strengthening.  Limited UE assist where able.  Pt required intermittent rest breaks this session due to fatigue and soreness.  Continued with Bodycraft TKE for quad strengthening and overall stability challenge. Added  vector to help improve SLS for hip stability.  Pt will continue to benefit from skilled therapy to progress towards goals.   Evaluation: Patient is a 72 y.o. female who was seen today for physical therapy evaluation and treatment for M17.11 (ICD-10-CM) - Primary osteoarthritis of right knee.  Patient demonstrates muscle weakness, reduced ROM, and fascial restrictions which are likely contributing to symptoms of pain and are negatively impacting patient ability to perform ADLs and functional mobility tasks. Patient will benefit from skilled physical therapy services to address these deficits to reduce pain and improve level of function with ADLs and functional mobility tasks.   OBJECTIVE IMPAIRMENTS: Abnormal gait, decreased activity tolerance, decreased endurance, decreased mobility, difficulty walking, decreased ROM, decreased strength, hypomobility, increased edema, increased fascial restrictions, impaired perceived functional ability, impaired flexibility, and pain.   ACTIVITY LIMITATIONS: carrying, lifting, bending, sitting, standing, squatting, sleeping, stairs, transfers, bed mobility, and locomotion level  PARTICIPATION LIMITATIONS: meal prep, cleaning, laundry, driving, shopping, community activity, and yard work  [facility] POTENTIAL: Good  CLINICAL DECISION MAKING: Stable/uncomplicated  EVALUATION COMPLEXITY: Low   GOALS: Goals reviewed with patient? No  SHORT TERM GOALS: Target date: 05/18/2023 patient will be independent with initial HEP  Baseline:  05/27/23:  Reports compliance with HEP Goal status: MET  2.  Patient will self report 30% improvement to improve tolerance for functional activity  Baseline:  05/27/23:  Reports improvements by 85% Goal status: 05/27/23:  Reports improvements by 85%   LONG TERM GOALS: Target date: 06/01/2023  Patient will be independent in self management strategies to improve quality of life and functional outcomes.  Baseline: 05/27/23:  Reports  improvements by 85% Goal status: MET   2.  Patient will self report 50% improvement to improve tolerance for functional activity  Baseline: 05/27/23:  Reports improvements by 85% Goal status: IN PROGRESS  3.  Patient will increase right knee mobility to -5 to 120 to promote normal navigation of steps; step over step pattern  Baseline: 05/27/23: 6-116 degrees Goal status: IN PROGRESS  4.  Patient will increase right leg MMTs to 5/5 without pain to promote return to ambulation community distances with minimal deviation.  Baseline: 05/27/23:  see above Goal status: IN PROGRESS  5.  Patient will increase distance on to 225 ft with LRAD  to demonstrate improved functional mobility walking household and community distances.   Baseline: 178 ft 05/27/23:  254ft no AD Goal status: IN PROGRESS  6.  Patient will improve 5 times sit to stand score from 20.55 sec to 15 sec without UE assist  to demonstrate improved functional mobility and increased lower extremity strength.  Baseline: 05/27/23:  5 sts 11.75" Goal status: IN PROGRESS  7.  Patient will improve FOTO by 10 points  Baseline: 50  Goal status: IN PROGRESS   PLAN:  PT FREQUENCY: 2x/week  PT DURATION: 4 weeks  PLANNED INTERVENTIONS: Therapeutic exercises, Therapeutic activity, Neuromuscular re-education, Balance training, Gait training, Patient/Family education, Joint manipulation, Joint mobilization, Stair training, Orthotic/Fit training, DME instructions, Aquatic Therapy, Dry Needling, Electrical stimulation, Spinal manipulation, Spinal mobilization, Cryotherapy, Moist heat, Compression bandaging, scar mobilization, Splintting, Taping, Traction, Ultrasound, Ionotophoresis 4mg /ml Dexamethasone, and Manual therapy   PLAN FOR NEXT SESSION: Progress right knee mobility and strength as able; gait, balance as appropriate.  Continue for 4 more weeks to address goals unmet.    Lurena Nida, PTA 06/08/2023, 2:48 PM   2:48 PM,  06/08/23

## 2023-06-10 ENCOUNTER — Encounter (HOSPITAL_COMMUNITY): Payer: Medicare Other

## 2023-06-15 ENCOUNTER — Other Ambulatory Visit: Payer: Self-pay | Admitting: Orthopedic Surgery

## 2023-06-17 ENCOUNTER — Encounter (HOSPITAL_COMMUNITY): Payer: Medicare Other

## 2023-06-21 ENCOUNTER — Ambulatory Visit (HOSPITAL_COMMUNITY): Payer: Medicare Other

## 2023-06-21 DIAGNOSIS — R262 Difficulty in walking, not elsewhere classified: Secondary | ICD-10-CM | POA: Diagnosis not present

## 2023-06-21 DIAGNOSIS — G8929 Other chronic pain: Secondary | ICD-10-CM

## 2023-06-21 DIAGNOSIS — M25661 Stiffness of right knee, not elsewhere classified: Secondary | ICD-10-CM

## 2023-06-21 DIAGNOSIS — M1711 Unilateral primary osteoarthritis, right knee: Secondary | ICD-10-CM

## 2023-06-21 DIAGNOSIS — M25561 Pain in right knee: Secondary | ICD-10-CM | POA: Diagnosis not present

## 2023-06-21 NOTE — Therapy (Signed)
OUTPATIENT PHYSICAL THERAPY LOWER EXTREMITY TREATMENT   Patient Name: Kelly Fox MRN: [id number] DOB:May 11, 1951, 72 y.o., female Today's Date: 06/21/2023  END OF SESSION:  PT End of Session - 06/21/23 1119     Visit Number 13    Number of Visits 16    Date for PT Re-Evaluation 06/24/23    Authorization Type [employer]    Progress Note Due on Visit 16    PT Start Time 1119    PT Stop Time 1200    PT Time Calculation (min) 41 min    Activity Tolerance Patient tolerated treatment well    Behavior During Therapy [facility] for tasks assessed/performed                   Past Medical History:  Diagnosis Date   Asthma    Past Surgical History:  Procedure Laterality Date   DENTAL SURGERY     KNEE ARTHROSCOPY Left 10/29/2022   Procedure: ARTHROSCOPY KNEE LEFT FOR SCAR TISSUE DEBRIDEMENT;  Surgeon: Vickki Hearing, MD;  Location: [facility];  Service: Orthopedics;  Laterality: Left;   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/02/2022   Procedure: TOTAL KNEE ARTHROPLASTY;  Surgeon: Vickki Hearing, MD;  Location: [facility];  Service: Orthopedics;  Laterality: Left;   TOTAL KNEE ARTHROPLASTY Right 04/19/2023   Procedure: TOTAL KNEE ARTHROPLASTY;  Surgeon: Vickki Hearing, MD;  Location: [facility];  Service: Orthopedics;  Laterality: Right;   Patient Active Problem List   Diagnosis Date Noted   Primary osteoarthritis of right knee 04/19/2023   Primary localized osteoarthritis of right knee 04/19/2023   Status post total left knee replacement February 02, 2022 01/28/2023   Patellar clunk syndrome of left knee 10/29/2022   Osteoarthritis of left knee 02/02/2022   Unilateral primary osteoarthritis, left knee    Elevated blood-pressure reading without diagnosis of hypertension 12/03/2021   Seasonal allergic rhinitis 12/03/2021    PCP: Nita Sells, MD  REFERRING PROVIDER: Vickki Hearing, MD  REFERRING DIAG: M17.11 (ICD-10-CM) - Primary osteoarthritis of right knee  THERAPY DIAG:   Chronic pain of right knee  Difficulty in walking, not elsewhere classified  Stiffness of right knee, not elsewhere classified  Osteoarthritis of right knee, unspecified osteoarthritis type  Rationale for Evaluation and Treatment: Rehabilitation  ONSET DATE: 04/19/23  SUBJECTIVE:   SUBJECTIVE STATEMENT: Patient reports that the R knee stays sore and is worse in the AM. Patient reports that she still has a limp. Patient reports that pain is at 5/10 today.    Eval:  Patient with right knee OA for long time; had left knee done April of last year; s/p R TKA 04/18/23 per Dr. Romeo Apple at [facility]. Had home health up until yesterday.  CPM  PERTINENT HISTORY: Left knee TKA April 23 PAIN:  Are you having pain? Yes: NPRS scale: 5/10 Pain location: right knee Pain description: really sore Aggravating factors: being still, bending Relieving factors: massage, pain meds  PRECAUTIONS: None  WEIGHT BEARING RESTRICTIONS: No  FALLS:  Has patient fallen in last 6 months? No  OCCUPATION: retired  PLOF: Independent  PATIENT GOALS: walk straight without a limp or pain  NEXT MD VISIT: 7/11 staple removal   OBJECTIVE:   DIAGNOSTIC FINDINGS:  CLINICAL DATA:  Status post right total knee replacement   EXAM: PORTABLE RIGHT KNEE - 1-2 VIEW   COMPARISON:  03/28/2023   FINDINGS: Changes of right knee replacement. No hardware or bony complicating feature. Soft tissue and joint space gas noted. Anterior  skin staples.   IMPRESSION: Right knee replacement.  No visible complicating feature.    PATIENT SURVEYS:  FOTO 50  COGNITION: Overall cognitive status: Within functional limits for tasks assessed     SENSATION: WFL  EDEMA:  Yes normal for this time s/p; noted bruising right lower leg especially   PALPATION: General soreness right knee  LOWER EXTREMITY ROM:  Active ROM Right eval Left eval Right 05/09/23 Right 05/23/23  Hip flexion      Hip extension      Hip abduction       Hip adduction      Hip internal rotation      Hip external rotation      Knee flexion 100  108 115  Knee extension -16  8 Lacking 6  Ankle dorsiflexion      Ankle plantarflexion      Ankle inversion      Ankle eversion       (Blank rows = not tested)  LOWER EXTREMITY MMT:  MMT Right eval Left eval Right 05/23/23 05/27/23: Right  Hip flexion 3-  4+   Hip extension    3+  Hip abduction    4/5   Hip adduction      Hip internal rotation      Hip external rotation      Knee flexion   4+ (isometric midrange) 4+ prone  Knee extension 2+  4+   Ankle dorsiflexion 3+  4+   Ankle plantarflexion      Ankle inversion      Ankle eversion       (Blank rows = not tested)  FUNCTIONAL TESTS:  5 times sit to stand: 20.55 sec with UE assist 2 minute walk test: 178 ft with RW  GAIT: Distance walked: 178 ft Assistive device utilized: Environmental consultant - 2 wheeled Level of assistance: SBA Comments: antalgic gait; decreased stance right LE   TODAY'S TREATMENT:                                                                                                                              DATE:  06/21/23: Bike seat 8 full revolution x 5' dynamic warm up Squat to heel raise 2x 10 while holding on to // bars  TKE on Body craft 2Pl 2x 10 5" holds Bodycraft walk out 3Pl 5RT retro and sidestep Vectors 10X5" each LE with UE assist. Seated hamstrings stretch x 30" x 3 Gastrocnemius slant board stretch x 30" x 3  06/08/23: Bike seat 8 full revolution x 5' dynamic warm up Squat to heel raise 2x 10 front of chair  TKE on Body craft 2Pl 2x 10 5" holds Bodycraft walk out 3Pl 5RT retro and sidestep Vectors 10X5" each LE with UE assist.  06/03/23: Bike seat 8 full revolution x 5' dynamic warm up Squat to heel raise 2x 10 front of chair with cueingfor mechanics Toe raise decline slope 15x TKE on Body craft  2Pl 2x 10 5" holds Bodycraft walk out 3Pl 5RT retro and sidestep 6in step up 15 Rt LE Lateral step  up 4in 15x Step down 4in 15x SLS Lt 11, Rt 7" max 12in hamstring stretch 3x 30" Slant board 3x 30" Leg press 2x 10 3Pl  06/01/23 Bike seat 8 full revolution x 5' dynamic warm up Heel walking 1RT Squat to heel raise 2x 10 front of chair with cueingfor mechanics TKE GTB x 20x Leg press 2x 10 3Pl 4in step up 2x 10 Rt LE 4in lateral step up Rt LE 15x 12in hamstring stretch 3x 30" Slant board 3x 30" Bodycraft walk out 3Pl 5RT retro and sidestep  05/30/23 Bike seat 8 full revolution x 5' dynamic warm up  Heel/toe raises x 20 Slant board 5 x 20" 12" box knee drives for knee flexion x 2' Hamstring stretch on 12" box 5 x 20" Squats to chair target 2 x 10 4" step ups  right leg leading 2 x 10 GTB TKE's x 20   05/27/23: Bike seat 8 full revolution x 5' dynamic warm up 263ft no AD MMT see above ROM measurement 6-116 degrees 5STS 18.03", 2nd set 11.75" no HHA Reciprocal pattern stairs 7in 5RT   05/04/23 physical therapy evaluation and HEP instruction    PATIENT EDUCATION:  Education details: Patient educated on exam findings, POC, scope of PT, HEP, and what to expect next visit. Person educated: Patient Education method: Explanation, Demonstration, and Handouts Education comprehension: verbalized understanding, returned demonstration, verbal cues required, and tactile cues required  HOME EXERCISE PROGRAM: Access Code: [email or web address] URL: [email or web address] 06/21/23 - Seated Calf Stretch with Strap  - 1-2 x daily - 7 x weekly - 3 reps - 30 hold  Date: 05/04/2023 Prepared by: [facility]  Exercises - Supine Ankle Pumps  - 2 x daily - 7 x weekly - 2 sets - 15 reps - Supine Quadricep Sets  - 2 x daily - 7 x weekly - 1 sets - 10 reps - 5 sec hold - Supine Short Arc Quad  - 2 x daily - 7 x weekly - 1 sets - 15 reps - Supine Heel Slides  - 2 x daily - 7 x weekly - 1 sets - 15 reps - Seated Long Arc Quad  - 2 x daily - 7 x weekly - 1 sets - 15 reps - Seated Knee  Flexion Stretch  - 2 x daily - 7 x weekly - 1 sets - 15 reps - Seated Heel Toe Raises  - 2 x daily - 7 x weekly - 1 sets - 10 reps - Seated March  - 2 x daily - 7 x weekly - 2 sets - 10 reps  Patient Education - Total Knee Replacement Handout  05/09/23:  STS 05/27/23: - Squat with Chair and Counter Support  - 1 x daily - 7 x weekly - 2 sets - 10 reps - Heel Toe Raises with Counter Support  - 1 x daily - 7 x weekly - 3 sets - 10 reps  06/03/23: SLS ASSESSMENT:  CLINICAL IMPRESSION: Patient continues with stiffness right knee; noted antalgic gait on arrival with right knee extension lag.  Added calf stretch today and hamstring stretches to address knee extension lag; updated HEP.  Tolerated without compliant or worsening soreness.  Reassess next visit.     Evaluation: Patient is a 72 y.o. female who was seen today for physical therapy evaluation and treatment for M17.11 (ICD-10-CM) - Primary  osteoarthritis of right knee.  Patient demonstrates muscle weakness, reduced ROM, and fascial restrictions which are likely contributing to symptoms of pain and are negatively impacting patient ability to perform ADLs and functional mobility tasks. Patient will benefit from skilled physical therapy services to address these deficits to reduce pain and improve level of function with ADLs and functional mobility tasks.   OBJECTIVE IMPAIRMENTS: Abnormal gait, decreased activity tolerance, decreased endurance, decreased mobility, difficulty walking, decreased ROM, decreased strength, hypomobility, increased edema, increased fascial restrictions, impaired perceived functional ability, impaired flexibility, and pain.   ACTIVITY LIMITATIONS: carrying, lifting, bending, sitting, standing, squatting, sleeping, stairs, transfers, bed mobility, and locomotion level  PARTICIPATION LIMITATIONS: meal prep, cleaning, laundry, driving, shopping, community activity, and yard work  [facility] POTENTIAL: Good  CLINICAL DECISION  MAKING: Stable/uncomplicated  EVALUATION COMPLEXITY: Low   GOALS: Goals reviewed with patient? No  SHORT TERM GOALS: Target date: 05/18/2023 patient will be independent with initial HEP  Baseline:  05/27/23:  Reports compliance with HEP Goal status: MET  2.  Patient will self report 30% improvement to improve tolerance for functional activity  Baseline:  05/27/23:  Reports improvements by 85% Goal status: 05/27/23:  Reports improvements by 85%   LONG TERM GOALS: Target date: 06/01/2023  Patient will be independent in self management strategies to improve quality of life and functional outcomes.  Baseline: 05/27/23:  Reports improvements by 85% Goal status: MET   2.  Patient will self report 50% improvement to improve tolerance for functional activity  Baseline: 05/27/23:  Reports improvements by 85% Goal status: IN PROGRESS  3.  Patient will increase right knee mobility to -5 to 120 to promote normal navigation of steps; step over step pattern  Baseline: 05/27/23: 6-116 degrees Goal status: IN PROGRESS  4.  Patient will increase right leg MMTs to 5/5 without pain to promote return to ambulation community distances with minimal deviation.  Baseline: 05/27/23:  see above Goal status: IN PROGRESS  5.  Patient will increase distance on to 225 ft with LRAD  to demonstrate improved functional mobility walking household and community distances.   Baseline: 178 ft 05/27/23:  280ft no AD Goal status: IN PROGRESS  6.  Patient will improve 5 times sit to stand score from 20.55 sec to 15 sec without UE assist  to demonstrate improved functional mobility and increased lower extremity strength.  Baseline: 05/27/23:  5 sts 11.75" Goal status: IN PROGRESS  7.  Patient will improve FOTO by 10 points  Baseline: 50  Goal status: IN PROGRESS   PLAN:  PT FREQUENCY: 2x/week  PT DURATION: 4 weeks  PLANNED INTERVENTIONS: Therapeutic exercises, Therapeutic activity, Neuromuscular  re-education, Balance training, Gait training, Patient/Family education, Joint manipulation, Joint mobilization, Stair training, Orthotic/Fit training, DME instructions, Aquatic Therapy, Dry Needling, Electrical stimulation, Spinal manipulation, Spinal mobilization, Cryotherapy, Moist heat, Compression bandaging, scar mobilization, Splintting, Taping, Traction, Ultrasound, Ionotophoresis 4mg /ml Dexamethasone, and Manual therapy   PLAN FOR NEXT SESSION: Progress right knee mobility and strength as able; gait, balance as appropriate.  Continue for 4 more weeks to address goals unmet.reassess next visit   12:04 PM, 06/21/23 Shephanie Romas Small Lowry Bala MPT [facility] [place] [phone]

## 2023-06-23 ENCOUNTER — Ambulatory Visit (HOSPITAL_COMMUNITY): Payer: Medicare Other

## 2023-06-23 DIAGNOSIS — M1711 Unilateral primary osteoarthritis, right knee: Secondary | ICD-10-CM

## 2023-06-23 DIAGNOSIS — G8929 Other chronic pain: Secondary | ICD-10-CM | POA: Diagnosis not present

## 2023-06-23 DIAGNOSIS — M25561 Pain in right knee: Secondary | ICD-10-CM | POA: Diagnosis not present

## 2023-06-23 DIAGNOSIS — R262 Difficulty in walking, not elsewhere classified: Secondary | ICD-10-CM

## 2023-06-23 DIAGNOSIS — M25661 Stiffness of right knee, not elsewhere classified: Secondary | ICD-10-CM

## 2023-06-23 NOTE — Therapy (Signed)
OUTPATIENT PHYSICAL THERAPY LOWER EXTREMITY TREATMENT   Patient Name: Kelly Fox MRN: [id number] DOB:1951-03-21, 72 y.o., female Today's Date: 06/23/2023  END OF SESSION:  PT End of Session - 06/23/23 1431     Visit Number 14    Number of Visits 16    Date for PT Re-Evaluation 06/24/23    Authorization Type [employer]    Progress Note Due on Visit 16    PT Start Time 0231    PT Stop Time 0312    PT Time Calculation (min) 41 min    Activity Tolerance Patient tolerated treatment well    Behavior During Therapy [facility] for tasks assessed/performed                   Past Medical History:  Diagnosis Date   Asthma    Past Surgical History:  Procedure Laterality Date   DENTAL SURGERY     KNEE ARTHROSCOPY Left 10/29/2022   Procedure: ARTHROSCOPY KNEE LEFT FOR SCAR TISSUE DEBRIDEMENT;  Surgeon: Vickki Hearing, MD;  Location: [facility];  Service: Orthopedics;  Laterality: Left;   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/02/2022   Procedure: TOTAL KNEE ARTHROPLASTY;  Surgeon: Vickki Hearing, MD;  Location: [facility];  Service: Orthopedics;  Laterality: Left;   TOTAL KNEE ARTHROPLASTY Right 04/19/2023   Procedure: TOTAL KNEE ARTHROPLASTY;  Surgeon: Vickki Hearing, MD;  Location: [facility];  Service: Orthopedics;  Laterality: Right;   Patient Active Problem List   Diagnosis Date Noted   Primary osteoarthritis of right knee 04/19/2023   Primary localized osteoarthritis of right knee 04/19/2023   Status post total left knee replacement February 02, 2022 01/28/2023   Patellar clunk syndrome of left knee 10/29/2022   Osteoarthritis of left knee 02/02/2022   Unilateral primary osteoarthritis, left knee    Elevated blood-pressure reading without diagnosis of hypertension 12/03/2021   Seasonal allergic rhinitis 12/03/2021    PCP: Nita Sells, MD  REFERRING PROVIDER: Vickki Hearing, MD  REFERRING DIAG: M17.11 (ICD-10-CM) - Primary osteoarthritis of right knee  THERAPY DIAG:   Chronic pain of right knee - Plan: PT plan of care cert/re-cert  Difficulty in walking, not elsewhere classified - Plan: PT plan of care cert/re-cert  Stiffness of right knee, not elsewhere classified - Plan: PT plan of care cert/re-cert  Osteoarthritis of right knee, unspecified osteoarthritis type - Plan: PT plan of care cert/re-cert  Rationale for Evaluation and Treatment: Rehabilitation  ONSET DATE: 04/19/23  SUBJECTIVE:   SUBJECTIVE STATEMENT: Patient reports that the R knee stays sore and is worse in the AM. Patient reports that she still has a limp. Patient reports that pain is at 5/10 today.    Eval:  Patient with right knee OA for long time; had left knee done April of last year; s/p R TKA 04/18/23 per Dr. Romeo Apple at [facility]. Had home health up until yesterday.  CPM  PERTINENT HISTORY: Left knee TKA April 23 PAIN:  Are you having pain? Yes: NPRS scale: 5/10 Pain location: right knee Pain description: really sore Aggravating factors: being still, bending Relieving factors: massage, pain meds  PRECAUTIONS: None  WEIGHT BEARING RESTRICTIONS: No  FALLS:  Has patient fallen in last 6 months? No  OCCUPATION: retired  PLOF: Independent  PATIENT GOALS: walk straight without a limp or pain  NEXT MD VISIT: 7/11 staple removal   OBJECTIVE:   DIAGNOSTIC FINDINGS:  CLINICAL DATA:  Status post right total knee replacement   EXAM: PORTABLE RIGHT KNEE - 1-2  VIEW   COMPARISON:  03/28/2023   FINDINGS: Changes of right knee replacement. No hardware or bony complicating feature. Soft tissue and joint space gas noted. Anterior skin staples.   IMPRESSION: Right knee replacement.  No visible complicating feature.    PATIENT SURVEYS:  FOTO 50  COGNITION: Overall cognitive status: Within functional limits for tasks assessed     SENSATION: WFL  EDEMA:  Yes normal for this time s/p; noted bruising right lower leg especially   PALPATION: General soreness right  knee  LOWER EXTREMITY ROM:  Active ROM Right eval Left eval Right 05/09/23 Right 05/23/23 Right 06/23/23  Hip flexion       Hip extension       Hip abduction       Hip adduction       Hip internal rotation       Hip external rotation       Knee flexion 100  108 115 120  Knee extension -16  8 Lacking 6 Lacking 6  Ankle dorsiflexion       Ankle plantarflexion       Ankle inversion       Ankle eversion        (Blank rows = not tested)  LOWER EXTREMITY MMT:  MMT Right eval Left eval Right 05/23/23 05/27/23: Right 06/23/23 Right   Hip flexion 3-  4+  5  Hip extension    3+ 4  Hip abduction    4/5    Hip adduction       Hip internal rotation       Hip external rotation       Knee flexion   4+ (isometric midrange) 4+ prone 5  Knee extension 2+  4+  4+  Ankle dorsiflexion 3+  4+    Ankle plantarflexion       Ankle inversion       Ankle eversion        (Blank rows = not tested)  FUNCTIONAL TESTS:  5 times sit to stand: 20.55 sec with UE assist 2 minute walk test: 178 ft with RW  GAIT: Distance walked: 178 ft Assistive device utilized: Environmental consultant - 2 wheeled Level of assistance: SBA Comments: antalgic gait; decreased stance right LE   TODAY'S TREATMENT:                                                                                                                              DATE:  06/23/23 Bike seat 8 full revolution x 5' dynamic warm up Progress note 5 times sit to stand 9.02 sec using hands on thighs 2 MWT 315 ft no AD FOTO 63 MMT's and AROM Gait training with SPC Slant board 5 x 20"  06/21/23: Bike seat 8 full revolution x 5' dynamic warm up Squat to heel raise 2x 10 while holding on to // bars  TKE on Body craft 2Pl 2x 10 5" holds Bodycraft walk out 3Pl 5RT retro  and sidestep Vectors 10X5" each LE with UE assist. Seated hamstrings stretch x 30" x 3 Gastrocnemius slant board stretch x 30" x 3  06/08/23: Bike seat 8 full revolution x 5' dynamic warm  up Squat to heel raise 2x 10 front of chair  TKE on Body craft 2Pl 2x 10 5" holds Bodycraft walk out 3Pl 5RT retro and sidestep Vectors 10X5" each LE with UE assist.  06/03/23: Bike seat 8 full revolution x 5' dynamic warm up Squat to heel raise 2x 10 front of chair with cueingfor mechanics Toe raise decline slope 15x TKE on Body craft 2Pl 2x 10 5" holds Bodycraft walk out 3Pl 5RT retro and sidestep 6in step up 15 Rt LE Lateral step up 4in 15x Step down 4in 15x SLS Lt 11, Rt 7" max 12in hamstring stretch 3x 30" Slant board 3x 30" Leg press 2x 10 3Pl  06/01/23 Bike seat 8 full revolution x 5' dynamic warm up Heel walking 1RT Squat to heel raise 2x 10 front of chair with cueingfor mechanics TKE GTB x 20x Leg press 2x 10 3Pl 4in step up 2x 10 Rt LE 4in lateral step up Rt LE 15x 12in hamstring stretch 3x 30" Slant board 3x 30" Bodycraft walk out 3Pl 5RT retro and sidestep  05/30/23 Bike seat 8 full revolution x 5' dynamic warm up  Heel/toe raises x 20 Slant board 5 x 20" 12" box knee drives for knee flexion x 2' Hamstring stretch on 12" box 5 x 20" Squats to chair target 2 x 10 4" step ups  right leg leading 2 x 10 GTB TKE's x 20   05/27/23: Bike seat 8 full revolution x 5' dynamic warm up 242ft no AD MMT see above ROM measurement 6-116 degrees 5STS 18.03", 2nd set 11.75" no HHA Reciprocal pattern stairs 7in 5RT   05/04/23 physical therapy evaluation and HEP instruction    PATIENT EDUCATION:  Education details: Patient educated on exam findings, POC, scope of PT, HEP, and what to expect next visit. Person educated: Patient Education method: Explanation, Demonstration, and Handouts Education comprehension: verbalized understanding, returned demonstration, verbal cues required, and tactile cues required  HOME EXERCISE PROGRAM: Access Code: [email or web address] URL: [email or web address] 06/21/23 - Seated Calf Stretch with Strap  - 1-2 x daily - 7 x weekly - 3  reps - 30 hold  Date: 05/04/2023 Prepared by: [facility]  Exercises - Supine Ankle Pumps  - 2 x daily - 7 x weekly - 2 sets - 15 reps - Supine Quadricep Sets  - 2 x daily - 7 x weekly - 1 sets - 10 reps - 5 sec hold - Supine Short Arc Quad  - 2 x daily - 7 x weekly - 1 sets - 15 reps - Supine Heel Slides  - 2 x daily - 7 x weekly - 1 sets - 15 reps - Seated Long Arc Quad  - 2 x daily - 7 x weekly - 1 sets - 15 reps - Seated Knee Flexion Stretch  - 2 x daily - 7 x weekly - 1 sets - 15 reps - Seated Heel Toe Raises  - 2 x daily - 7 x weekly - 1 sets - 10 reps - Seated March  - 2 x daily - 7 x weekly - 2 sets - 10 reps  Patient Education - Total Knee Replacement Handout  05/09/23:  STS 05/27/23: - Squat with Chair and Counter Support  - 1 x daily - 7 x weekly -  2 sets - 10 reps - Heel Toe Raises with Counter Support  - 1 x daily - 7 x weekly - 3 sets - 10 reps  06/03/23: SLS ASSESSMENT:  CLINICAL IMPRESSION: Progress note today.  Patient continues with stiffness right knee; noted antalgic gait on arrival with right knee extension lag.  Patient with much improved gait with cane and encouraged her to use the cane.  Good improvement noted with all functional scores; she has not quite met goals for strength and ROM with mild extension lag.  Patient will benefit from continued therapy services to address remaining unmet and partially met goals.   Evaluation: Patient is a 72 y.o. female who was seen today for physical therapy evaluation and treatment for M17.11 (ICD-10-CM) - Primary osteoarthritis of right knee.  Patient demonstrates muscle weakness, reduced ROM, and fascial restrictions which are likely contributing to symptoms of pain and are negatively impacting patient ability to perform ADLs and functional mobility tasks. Patient will benefit from skilled physical therapy services to address these deficits to reduce pain and improve level of function with ADLs and functional mobility  tasks.   OBJECTIVE IMPAIRMENTS: Abnormal gait, decreased activity tolerance, decreased endurance, decreased mobility, difficulty walking, decreased ROM, decreased strength, hypomobility, increased edema, increased fascial restrictions, impaired perceived functional ability, impaired flexibility, and pain.   ACTIVITY LIMITATIONS: carrying, lifting, bending, sitting, standing, squatting, sleeping, stairs, transfers, bed mobility, and locomotion level  PARTICIPATION LIMITATIONS: meal prep, cleaning, laundry, driving, shopping, community activity, and yard work  [facility] POTENTIAL: Good  CLINICAL DECISION MAKING: Stable/uncomplicated  EVALUATION COMPLEXITY: Low   GOALS: Goals reviewed with patient? No  SHORT TERM GOALS: Target date: 05/18/2023 patient will be independent with initial HEP  Baseline:  05/27/23:  Reports compliance with HEP Goal status: MET  2.  Patient will self report 30% improvement to improve tolerance for functional activity  Baseline:  05/27/23:  Reports improvements by 85% Goal status: 05/27/23:  Reports improvements by 85%   LONG TERM GOALS: Target date:07/14/2023  Patient will be independent in self management strategies to improve quality of life and functional outcomes.  Baseline: 05/27/23:  Reports improvements by 85% Goal status: MET   2.  Patient will self report 50% improvement to improve tolerance for functional activity  Baseline: 05/27/23:  Reports improvements by 85% Goal status: IN PROGRESS  3.  Patient will increase right knee mobility to -5 to 120 to promote normal navigation of steps; step over step pattern  Baseline: 05/27/23: 6-116 degrees; -6 to 120 8/29 Goal status: IN PROGRESS  4.  Patient will increase right leg MMTs to 5/5 without pain to promote return to ambulation community distances with minimal deviation.  Baseline: 05/27/23:  see above Goal status: IN PROGRESS  5.  Patient will increase distance on to 225 ft with LRAD  to  demonstrate improved functional mobility walking household and community distances.   Baseline: 178 ft 05/27/23:  268ft no AD Goal status: MET  6.  Patient will improve 5 times sit to stand score from 20.55 sec to 15 sec without UE assist  to demonstrate improved functional mobility and increased lower extremity strength.  Baseline: 05/27/23:  5 sts 11.75" Goal status: met  7.  Patient will improve FOTO by 10 points  Baseline: 50  Goal status: met   PLAN:  PT FREQUENCY: 2x/week  PT DURATION: 4 weeks  PLANNED INTERVENTIONS: Therapeutic exercises, Therapeutic activity, Neuromuscular re-education, Balance training, Gait training, Patient/Family education, Joint manipulation, Joint mobilization, Stair training,  Orthotic/Fit training, DME instructions, Aquatic Therapy, Dry Needling, Electrical stimulation, Spinal manipulation, Spinal mobilization, Cryotherapy, Moist heat, Compression bandaging, scar mobilization, Splintting, Taping, Traction, Ultrasound, Ionotophoresis 4mg /ml Dexamethasone, and Manual therapy   PLAN FOR NEXT SESSION: Progress right knee mobility and strength as able; gait, balance as appropriate.  Continue for 3 more weeks to address goals unmet.   3:10 PM, 06/23/23 Keon Pender Small Josee Speece MPT [facility] [place] [phone]

## 2023-06-28 ENCOUNTER — Ambulatory Visit (HOSPITAL_COMMUNITY): Payer: Medicare Other | Attending: Orthopedic Surgery

## 2023-06-28 DIAGNOSIS — M25561 Pain in right knee: Secondary | ICD-10-CM | POA: Diagnosis not present

## 2023-06-28 DIAGNOSIS — G8929 Other chronic pain: Secondary | ICD-10-CM | POA: Diagnosis not present

## 2023-06-28 DIAGNOSIS — R262 Difficulty in walking, not elsewhere classified: Secondary | ICD-10-CM | POA: Insufficient documentation

## 2023-06-28 DIAGNOSIS — M25661 Stiffness of right knee, not elsewhere classified: Secondary | ICD-10-CM | POA: Insufficient documentation

## 2023-06-28 DIAGNOSIS — M1711 Unilateral primary osteoarthritis, right knee: Secondary | ICD-10-CM | POA: Diagnosis not present

## 2023-06-28 NOTE — Therapy (Signed)
OUTPATIENT PHYSICAL THERAPY LOWER EXTREMITY TREATMENT   Patient Name: Kelly Fox MRN: [id number] DOB:1951-02-24, 72 y.o., female Today's Date: 06/28/2023  END OF SESSION:  PT End of Session - 06/28/23 1307     Visit Number 15    Number of Visits 22    Date for PT Re-Evaluation 07/14/23    Authorization Type [employer]    Progress Note Due on Visit 16    PT Start Time 1305    PT Stop Time 1345    PT Time Calculation (min) 40 min    Activity Tolerance Patient tolerated treatment well    Behavior During Therapy [facility] for tasks assessed/performed                   Past Medical History:  Diagnosis Date   Asthma    Past Surgical History:  Procedure Laterality Date   DENTAL SURGERY     KNEE ARTHROSCOPY Left 10/29/2022   Procedure: ARTHROSCOPY KNEE LEFT FOR SCAR TISSUE DEBRIDEMENT;  Surgeon: Vickki Hearing, MD;  Location: [facility];  Service: Orthopedics;  Laterality: Left;   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/02/2022   Procedure: TOTAL KNEE ARTHROPLASTY;  Surgeon: Vickki Hearing, MD;  Location: [facility];  Service: Orthopedics;  Laterality: Left;   TOTAL KNEE ARTHROPLASTY Right 04/19/2023   Procedure: TOTAL KNEE ARTHROPLASTY;  Surgeon: Vickki Hearing, MD;  Location: [facility];  Service: Orthopedics;  Laterality: Right;   Patient Active Problem List   Diagnosis Date Noted   Primary osteoarthritis of right knee 04/19/2023   Primary localized osteoarthritis of right knee 04/19/2023   Status post total left knee replacement February 02, 2022 01/28/2023   Patellar clunk syndrome of left knee 10/29/2022   Osteoarthritis of left knee 02/02/2022   Unilateral primary osteoarthritis, left knee    Elevated blood-pressure reading without diagnosis of hypertension 12/03/2021   Seasonal allergic rhinitis 12/03/2021    PCP: Nita Sells, MD  REFERRING PROVIDER: Vickki Hearing, MD  REFERRING DIAG: M17.11 (ICD-10-CM) - Primary osteoarthritis of right knee  THERAPY DIAG:   Chronic pain of right knee  Difficulty in walking, not elsewhere classified  Stiffness of right knee, not elsewhere classified  Osteoarthritis of right knee, unspecified osteoarthritis type  Rationale for Evaluation and Treatment: Rehabilitation  ONSET DATE: 04/19/23  SUBJECTIVE:   SUBJECTIVE STATEMENT: Knee did ok over the weekend; getting better just hard to get it straight.  Eval:  Patient with right knee OA for long time; had left knee done April of last year; s/p R TKA 04/18/23 per Dr. Romeo Apple at [facility]. Had home health up until yesterday.  CPM  PERTINENT HISTORY: Left knee TKA April 23 PAIN:  Are you having pain? Yes: NPRS scale: 5/10 Pain location: right knee Pain description: really sore Aggravating factors: being still, bending Relieving factors: massage, pain meds  PRECAUTIONS: None  WEIGHT BEARING RESTRICTIONS: No  FALLS:  Has patient fallen in last 6 months? No  OCCUPATION: retired  PLOF: Independent  PATIENT GOALS: walk straight without a limp or pain  NEXT MD VISIT: 7/11 staple removal   OBJECTIVE:   DIAGNOSTIC FINDINGS:  CLINICAL DATA:  Status post right total knee replacement   EXAM: PORTABLE RIGHT KNEE - 1-2 VIEW   COMPARISON:  03/28/2023   FINDINGS: Changes of right knee replacement. No hardware or bony complicating feature. Soft tissue and joint space gas noted. Anterior skin staples.   IMPRESSION: Right knee replacement.  No visible complicating feature.    PATIENT SURVEYS:  FOTO 50  COGNITION: Overall cognitive status: Within functional limits for tasks assessed     SENSATION: WFL  EDEMA:  Yes normal for this time s/p; noted bruising right lower leg especially   PALPATION: General soreness right knee  LOWER EXTREMITY ROM:  Active ROM Right eval Left eval Right 05/09/23 Right 05/23/23 Right 06/23/23  Hip flexion       Hip extension       Hip abduction       Hip adduction       Hip internal rotation       Hip  external rotation       Knee flexion 100  108 115 120  Knee extension -16  8 Lacking 6 Lacking 6  Ankle dorsiflexion       Ankle plantarflexion       Ankle inversion       Ankle eversion        (Blank rows = not tested)  LOWER EXTREMITY MMT:  MMT Right eval Left eval Right 05/23/23 05/27/23: Right 06/23/23 Right   Hip flexion 3-  4+  5  Hip extension    3+ 4  Hip abduction    4/5    Hip adduction       Hip internal rotation       Hip external rotation       Knee flexion   4+ (isometric midrange) 4+ prone 5  Knee extension 2+  4+  4+  Ankle dorsiflexion 3+  4+    Ankle plantarflexion       Ankle inversion       Ankle eversion        (Blank rows = not tested)  FUNCTIONAL TESTS:  5 times sit to stand: 20.55 sec with UE assist 2 minute walk test: 178 ft with RW  GAIT: Distance walked: 178 ft Assistive device utilized: Environmental consultant - 2 wheeled Level of assistance: SBA Comments: antalgic gait; decreased stance right LE   TODAY'S TREATMENT:                                                                                                                              DATE:  06/28/23 Bike seat 8 full revolution x 5' dynamic warm up  Standing: Slant board 5 x 20" Hamstring stretch 5 x 20" TKE BTB x 15 Heel raises on incline x 20  Sit to stand with left foot advanced from mat table 2 x10  Sitting  LAQ 2# 2" pause 2 x 10  Prone hang x 2'  06/23/23 Bike seat 8 full revolution x 5' dynamic warm up Progress note 5 times sit to stand 9.02 sec using hands on thighs 2 MWT 315 ft no AD FOTO 63 MMT's and AROM Gait training with SPC Slant board 5 x 20"  06/21/23: Bike seat 8 full revolution x 5' dynamic warm up Squat to heel raise 2x 10 while holding on to // bars  TKE on Body craft 2Pl 2x 10 5" holds Bodycraft walk out 3Pl 5RT retro and sidestep Vectors 10X5" each LE with UE assist. Seated hamstrings stretch x 30" x 3 Gastrocnemius slant board stretch x 30" x  3  06/08/23: Bike seat 8 full revolution x 5' dynamic warm up Squat to heel raise 2x 10 front of chair  TKE on Body craft 2Pl 2x 10 5" holds Bodycraft walk out 3Pl 5RT retro and sidestep Vectors 10X5" each LE with UE assist.  06/03/23: Bike seat 8 full revolution x 5' dynamic warm up Squat to heel raise 2x 10 front of chair with cueingfor mechanics Toe raise decline slope 15x TKE on Body craft 2Pl 2x 10 5" holds Bodycraft walk out 3Pl 5RT retro and sidestep 6in step up 15 Rt LE Lateral step up 4in 15x Step down 4in 15x SLS Lt 11, Rt 7" max 12in hamstring stretch 3x 30" Slant board 3x 30" Leg press 2x 10 3Pl  06/01/23 Bike seat 8 full revolution x 5' dynamic warm up Heel walking 1RT Squat to heel raise 2x 10 front of chair with cueingfor mechanics TKE GTB x 20x Leg press 2x 10 3Pl 4in step up 2x 10 Rt LE 4in lateral step up Rt LE 15x 12in hamstring stretch 3x 30" Slant board 3x 30" Bodycraft walk out 3Pl 5RT retro and sidestep  05/30/23 Bike seat 8 full revolution x 5' dynamic warm up  Heel/toe raises x 20 Slant board 5 x 20" 12" box knee drives for knee flexion x 2' Hamstring stretch on 12" box 5 x 20" Squats to chair target 2 x 10 4" step ups  right leg leading 2 x 10 GTB TKE's x 20   05/27/23: Bike seat 8 full revolution x 5' dynamic warm up 233ft no AD MMT see above ROM measurement 6-116 degrees 5STS 18.03", 2nd set 11.75" no HHA Reciprocal pattern stairs 7in 5RT   05/04/23 physical therapy evaluation and HEP instruction    PATIENT EDUCATION:  Education details: Patient educated on exam findings, POC, scope of PT, HEP, and what to expect next visit. Person educated: Patient Education method: Explanation, Demonstration, and Handouts Education comprehension: verbalized understanding, returned demonstration, verbal cues required, and tactile cues required  HOME EXERCISE PROGRAM: Access Code: [email or web address] URL:  [email or web address]  06/28/23 TKE; prone hang 06/21/23 - Seated Calf Stretch with Strap  - 1-2 x daily - 7 x weekly - 3 reps - 30 hold  Date: 05/04/2023 Prepared by: [facility]  Exercises - Supine Ankle Pumps  - 2 x daily - 7 x weekly - 2 sets - 15 reps - Supine Quadricep Sets  - 2 x daily - 7 x weekly - 1 sets - 10 reps - 5 sec hold - Supine Short Arc Quad  - 2 x daily - 7 x weekly - 1 sets - 15 reps - Supine Heel Slides  - 2 x daily - 7 x weekly - 1 sets - 15 reps - Seated Long Arc Quad  - 2 x daily - 7 x weekly - 1 sets - 15 reps - Seated Knee Flexion Stretch  - 2 x daily - 7 x weekly - 1 sets - 15 reps - Seated Heel Toe Raises  - 2 x daily - 7 x weekly - 1 sets - 10 reps - Seated March  - 2 x daily - 7 x weekly - 2 sets - 10 reps  Patient Education - Total Knee Replacement Handout  05/09/23:  STS 05/27/23: - Squat with Chair and Counter Support  - 1 x daily - 7 x weekly - 2 sets - 10 reps - Heel Toe Raises with Counter Support  - 1 x daily - 7 x weekly - 3 sets - 10 reps  06/03/23: SLS ASSESSMENT:  CLINICAL IMPRESSION: Arrives with cane today; continues with slight extension lag right knee with ambulation.  Focused on knee extension mobility and strength today.   Added TKE and prone hang to HEP and issued blue theraband for home use. Patient continues with slight lag although improving. Added sit to stand with left foot advanced to focus on right leg strengthening,   Patient will benefit from continued therapy services to address remaining unmet and partially met goals.   Evaluation: Patient is a 72 y.o. female who was seen today for physical therapy evaluation and treatment for M17.11 (ICD-10-CM) - Primary osteoarthritis of right knee.  Patient demonstrates muscle weakness, reduced ROM, and fascial restrictions which are likely contributing to symptoms of pain and are negatively impacting patient ability to perform ADLs and functional mobility tasks. Patient will benefit  from skilled physical therapy services to address these deficits to reduce pain and improve level of function with ADLs and functional mobility tasks.   OBJECTIVE IMPAIRMENTS: Abnormal gait, decreased activity tolerance, decreased endurance, decreased mobility, difficulty walking, decreased ROM, decreased strength, hypomobility, increased edema, increased fascial restrictions, impaired perceived functional ability, impaired flexibility, and pain.   ACTIVITY LIMITATIONS: carrying, lifting, bending, sitting, standing, squatting, sleeping, stairs, transfers, bed mobility, and locomotion level  PARTICIPATION LIMITATIONS: meal prep, cleaning, laundry, driving, shopping, community activity, and yard work  [facility] POTENTIAL: Good  CLINICAL DECISION MAKING: Stable/uncomplicated  EVALUATION COMPLEXITY: Low   GOALS: Goals reviewed with patient? No  SHORT TERM GOALS: Target date: 05/18/2023 patient will be independent with initial HEP  Baseline:  05/27/23:  Reports compliance with HEP Goal status: MET  2.  Patient will self report 30% improvement to improve tolerance for functional activity  Baseline:  05/27/23:  Reports improvements by 85% Goal status: 05/27/23:  Reports improvements by 85%   LONG TERM GOALS: Target date:07/14/2023  Patient will be independent in self management strategies to improve quality of life and functional outcomes.  Baseline: 05/27/23:  Reports improvements by 85% Goal status: MET   2.  Patient will self report 50% improvement to improve tolerance for functional activity  Baseline: 05/27/23:  Reports improvements by 85% Goal status: IN PROGRESS  3.  Patient will increase right knee mobility to -5 to 120 to promote normal navigation of steps; step over step pattern  Baseline: 05/27/23: 6-116 degrees; -6 to 120 8/29 Goal status: IN PROGRESS  4.  Patient will increase right leg MMTs to 5/5 without pain to promote return to ambulation community distances with minimal  deviation.  Baseline: 05/27/23:  see above Goal status: IN PROGRESS  5.  Patient will increase distance on to 225 ft with LRAD  to demonstrate improved functional mobility walking household and community distances.   Baseline: 178 ft 05/27/23:  257ft no AD Goal status: MET  6.  Patient will improve 5 times sit to stand score from 20.55 sec to 15 sec without UE assist  to demonstrate improved functional mobility and increased lower extremity strength.  Baseline: 05/27/23:  5 sts 11.75" Goal status: met  7.  Patient will improve FOTO by 10 points  Baseline: 50  Goal status: met   PLAN:  PT FREQUENCY: 2x/week  PT  DURATION: 4 weeks  PLANNED INTERVENTIONS: Therapeutic exercises, Therapeutic activity, Neuromuscular re-education, Balance training, Gait training, Patient/Family education, Joint manipulation, Joint mobilization, Stair training, Orthotic/Fit training, DME instructions, Aquatic Therapy, Dry Needling, Electrical stimulation, Spinal manipulation, Spinal mobilization, Cryotherapy, Moist heat, Compression bandaging, scar mobilization, Splintting, Taping, Traction, Ultrasound, Ionotophoresis 4mg /ml Dexamethasone, and Manual therapy   PLAN FOR NEXT SESSION: Progress right knee mobility and strength as able; gait, balance as appropriate.  Continue for 3 more weeks to address goals unmet.   1:43 PM, 06/28/23 Stokely Jeancharles Small Josie Burleigh MPT [facility] [place] [phone]

## 2023-06-30 ENCOUNTER — Encounter: Payer: Self-pay | Admitting: Orthopedic Surgery

## 2023-06-30 ENCOUNTER — Ambulatory Visit (INDEPENDENT_AMBULATORY_CARE_PROVIDER_SITE_OTHER): Payer: Medicare Other | Admitting: Orthopedic Surgery

## 2023-06-30 ENCOUNTER — Other Ambulatory Visit (INDEPENDENT_AMBULATORY_CARE_PROVIDER_SITE_OTHER): Payer: Medicare Other

## 2023-06-30 DIAGNOSIS — M25561 Pain in right knee: Secondary | ICD-10-CM | POA: Diagnosis not present

## 2023-06-30 DIAGNOSIS — Z96651 Presence of right artificial knee joint: Secondary | ICD-10-CM

## 2023-06-30 DIAGNOSIS — M1711 Unilateral primary osteoarthritis, right knee: Secondary | ICD-10-CM

## 2023-06-30 DIAGNOSIS — T8484XA Pain due to internal orthopedic prosthetic devices, implants and grafts, initial encounter: Secondary | ICD-10-CM

## 2023-06-30 DIAGNOSIS — G8929 Other chronic pain: Secondary | ICD-10-CM | POA: Diagnosis not present

## 2023-06-30 DIAGNOSIS — Z96659 Presence of unspecified artificial knee joint: Secondary | ICD-10-CM

## 2023-06-30 DIAGNOSIS — M5431 Sciatica, right side: Secondary | ICD-10-CM

## 2023-06-30 MED ORDER — PREDNISONE 10 MG PO TABS
10.0000 mg | ORAL_TABLET | Freq: Three times a day (TID) | ORAL | 0 refills | Status: AC
Start: 2023-06-30 — End: 2023-07-30

## 2023-06-30 NOTE — Patient Instructions (Signed)
Stop taking Celebrex and start

## 2023-06-30 NOTE — Progress Notes (Signed)
Chief Complaint  Patient presents with   Post-op Follow-up    Right knee 04/19/23   Chief complaint is soreness right knee.  Patient says she feels very sore in the patellofemoral region and around the femur  Not much pain when she is walking   Status post right total knee  Complicated by sciatica  2 months and 10 days after surgery  Pain control  Range of motion -6 to 120   Incision no signs of infection they are  She is tender on both sides of the femur to palpation.  The joint is not warm to touch or red.  There does appear to be an effusion   Current medications include tramadol and Celebrex  X-ray was obtained    Assessment and plan  Encounter Diagnoses  Name Primary?   Status post total right knee replacement April 19, 2023 Yes   Pain in knee region after total knee replacement, initial encounter [facility])    Primary osteoarthritis of right knee    Sciatica of right side    Sciatica resolved  Unexplained pain postop right total knee 2-1/2 months.  First I am going to try more powerful anti-inflammatories stop the Celebrex  It is too early to do a bone scan for loosening  Return in a month  Xrays 4 views   INT and EXT OBL AP Perfect LAT    Meds ordered this encounter  Medications   predniSONE (DELTASONE) 10 MG tablet    Sig: Take 1 tablet (10 mg total) by mouth 3 (three) times daily.    Dispense:  90 tablet    Refill:  0

## 2023-07-05 ENCOUNTER — Encounter (HOSPITAL_COMMUNITY): Payer: Self-pay

## 2023-07-05 ENCOUNTER — Ambulatory Visit (HOSPITAL_COMMUNITY): Payer: Medicare Other

## 2023-07-05 DIAGNOSIS — M1711 Unilateral primary osteoarthritis, right knee: Secondary | ICD-10-CM

## 2023-07-05 DIAGNOSIS — G8929 Other chronic pain: Secondary | ICD-10-CM | POA: Diagnosis not present

## 2023-07-05 DIAGNOSIS — R262 Difficulty in walking, not elsewhere classified: Secondary | ICD-10-CM

## 2023-07-05 DIAGNOSIS — M25661 Stiffness of right knee, not elsewhere classified: Secondary | ICD-10-CM | POA: Diagnosis not present

## 2023-07-05 DIAGNOSIS — M25561 Pain in right knee: Secondary | ICD-10-CM | POA: Diagnosis not present

## 2023-07-05 NOTE — Therapy (Signed)
OUTPATIENT PHYSICAL THERAPY LOWER EXTREMITY TREATMENT   Patient Name: Kelly Fox MRN: [id number] DOB:1951/03/10, 72 y.o., female Today's Date: 07/05/2023  END OF SESSION:  PT End of Session - 07/05/23 1348     Visit Number 16    Number of Visits 22   PN on 8/29; +6 sessions= 20 visits   Date for PT Re-Evaluation 07/14/23    Authorization Type [employer]    Progress Note Due on Visit 20   PN on 8/29; +6 sessions= 20 visits   PT Start Time 1318   late arrival   PT Stop Time 1343    PT Time Calculation (min) 25 min    Activity Tolerance Patient tolerated treatment well    Behavior During Therapy [facility] for tasks assessed/performed                    Past Medical History:  Diagnosis Date   Asthma    Past Surgical History:  Procedure Laterality Date   DENTAL SURGERY     KNEE ARTHROSCOPY Left 10/29/2022   Procedure: ARTHROSCOPY KNEE LEFT FOR SCAR TISSUE DEBRIDEMENT;  Surgeon: Vickki Hearing, MD;  Location: [facility];  Service: Orthopedics;  Laterality: Left;   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/02/2022   Procedure: TOTAL KNEE ARTHROPLASTY;  Surgeon: Vickki Hearing, MD;  Location: [facility];  Service: Orthopedics;  Laterality: Left;   TOTAL KNEE ARTHROPLASTY Right 04/19/2023   Procedure: TOTAL KNEE ARTHROPLASTY;  Surgeon: Vickki Hearing, MD;  Location: [facility];  Service: Orthopedics;  Laterality: Right;   Patient Active Problem List   Diagnosis Date Noted   Primary osteoarthritis of right knee 04/19/2023   Primary localized osteoarthritis of right knee 04/19/2023   Status post total left knee replacement February 02, 2022 01/28/2023   Patellar clunk syndrome of left knee 10/29/2022   Osteoarthritis of left knee 02/02/2022   Unilateral primary osteoarthritis, left knee    Elevated blood-pressure reading without diagnosis of hypertension 12/03/2021   Seasonal allergic rhinitis 12/03/2021    PCP: Nita Sells, MD  REFERRING PROVIDER: Vickki Hearing,  MD  REFERRING DIAG: M17.11 (ICD-10-CM) - Primary osteoarthritis of right knee  THERAPY DIAG:  Chronic pain of right knee  Difficulty in walking, not elsewhere classified  Stiffness of right knee, not elsewhere classified  Osteoarthritis of right knee, unspecified osteoarthritis type  Rationale for Evaluation and Treatment: Rehabilitation  ONSET DATE: 04/19/23  SUBJECTIVE:   SUBJECTIVE STATEMENT: Knee did ok over the weekend; getting better just hard to get it straight.  Late arrival, stated he husband has a vertigo episode today.  Knee feels good today, no reports of pain.   Eval:  Patient with right knee OA for long time; had left knee done April of last year; s/p R TKA 04/18/23 per Dr. Romeo Apple at [facility]. Had home health up until yesterday.  CPM  PERTINENT HISTORY: Left knee TKA April 23 PAIN:  Are you having pain? Yes: NPRS scale: 5/10 Pain location: right knee Pain description: really sore Aggravating factors: being still, bending Relieving factors: massage, pain meds  PRECAUTIONS: None  WEIGHT BEARING RESTRICTIONS: No  FALLS:  Has patient fallen in last 6 months? No  OCCUPATION: retired  PLOF: Independent  PATIENT GOALS: walk straight without a limp or pain  NEXT MD VISIT: 7/11 staple removal   OBJECTIVE:   DIAGNOSTIC FINDINGS:  CLINICAL DATA:  Status post right total knee replacement   EXAM: PORTABLE RIGHT KNEE - 1-2 VIEW   COMPARISON:  03/28/2023   FINDINGS: Changes of right knee replacement. No hardware or bony complicating feature. Soft tissue and joint space gas noted. Anterior skin staples.   IMPRESSION: Right knee replacement.  No visible complicating feature.    PATIENT SURVEYS:  FOTO 50  COGNITION: Overall cognitive status: Within functional limits for tasks assessed     SENSATION: WFL  EDEMA:  Yes normal for this time s/p; noted bruising right lower leg especially   PALPATION: General soreness right knee  LOWER EXTREMITY  ROM:  Active ROM Right eval Left eval Right 05/09/23 Right 05/23/23 Right 06/23/23  Hip flexion       Hip extension       Hip abduction       Hip adduction       Hip internal rotation       Hip external rotation       Knee flexion 100  108 115 120  Knee extension -16  8 Lacking 6 Lacking 6  Ankle dorsiflexion       Ankle plantarflexion       Ankle inversion       Ankle eversion        (Blank rows = not tested)  LOWER EXTREMITY MMT:  MMT Right eval Left eval Right 05/23/23 05/27/23: Right 06/23/23 Right   Hip flexion 3-  4+  5  Hip extension    3+ 4  Hip abduction    4/5    Hip adduction       Hip internal rotation       Hip external rotation       Knee flexion   4+ (isometric midrange) 4+ prone 5  Knee extension 2+  4+  4+  Ankle dorsiflexion 3+  4+    Ankle plantarflexion       Ankle inversion       Ankle eversion        (Blank rows = not tested)  FUNCTIONAL TESTS:  5 times sit to stand: 20.55 sec with UE assist 2 minute walk test: 178 ft with RW  GAIT: Distance walked: 178 ft Assistive device utilized: Environmental consultant - 2 wheeled Level of assistance: SBA Comments: antalgic gait; decreased stance right LE   TODAY'S TREATMENT:                                                                                                                              DATE:  07/05/23: Reverse gait on treadmill 4' at .6-.7spm Heel raises on incline slope 10x 2 5" holds Slant board 3x 30" Squat with gluteal and quad squeeze at the end 10x TKE 2Pl 15x 5"   06/28/23 Bike seat 8 full revolution x 5' dynamic warm up  Standing: Slant board 5 x 20" Hamstring stretch 5 x 20" TKE BTB x 15 Heel raises on incline x 20  Sit to stand with left foot advanced from mat table 2 x10  Sitting  LAQ 2# 2"  pause 2 x 10  Prone hang x 2'  06/23/23 Bike seat 8 full revolution x 5' dynamic warm up Progress note 5 times sit to stand 9.02 sec using hands on thighs 2 MWT 315 ft no AD FOTO  63 MMT's and AROM Gait training with SPC Slant board 5 x 20"  06/21/23: Bike seat 8 full revolution x 5' dynamic warm up Squat to heel raise 2x 10 while holding on to // bars  TKE on Body craft 2Pl 2x 10 5" holds Bodycraft walk out 3Pl 5RT retro and sidestep Vectors 10X5" each LE with UE assist. Seated hamstrings stretch x 30" x 3 Gastrocnemius slant board stretch x 30" x 3  06/08/23: Bike seat 8 full revolution x 5' dynamic warm up Squat to heel raise 2x 10 front of chair  TKE on Body craft 2Pl 2x 10 5" holds Bodycraft walk out 3Pl 5RT retro and sidestep Vectors 10X5" each LE with UE assist.  06/03/23: Bike seat 8 full revolution x 5' dynamic warm up Squat to heel raise 2x 10 front of chair with cueingfor mechanics Toe raise decline slope 15x TKE on Body craft 2Pl 2x 10 5" holds Bodycraft walk out 3Pl 5RT retro and sidestep 6in step up 15 Rt LE Lateral step up 4in 15x Step down 4in 15x SLS Lt 11, Rt 7" max 12in hamstring stretch 3x 30" Slant board 3x 30" Leg press 2x 10 3Pl  06/01/23 Bike seat 8 full revolution x 5' dynamic warm up Heel walking 1RT Squat to heel raise 2x 10 front of chair with cueingfor mechanics TKE GTB x 20x Leg press 2x 10 3Pl 4in step up 2x 10 Rt LE 4in lateral step up Rt LE 15x 12in hamstring stretch 3x 30" Slant board 3x 30" Bodycraft walk out 3Pl 5RT retro and sidestep  05/30/23 Bike seat 8 full revolution x 5' dynamic warm up  Heel/toe raises x 20 Slant board 5 x 20" 12" box knee drives for knee flexion x 2' Hamstring stretch on 12" box 5 x 20" Squats to chair target 2 x 10 4" step ups  right leg leading 2 x 10 GTB TKE's x 20   05/27/23: Bike seat 8 full revolution x 5' dynamic warm up 240ft no AD MMT see above ROM measurement 6-116 degrees 5STS 18.03", 2nd set 11.75" no HHA Reciprocal pattern stairs 7in 5RT   05/04/23 physical therapy evaluation and HEP instruction    PATIENT EDUCATION:  Education details: Patient educated  on exam findings, POC, scope of PT, HEP, and what to expect next visit. Person educated: Patient Education method: Explanation, Demonstration, and Handouts Education comprehension: verbalized understanding, returned demonstration, verbal cues required, and tactile cues required  HOME EXERCISE PROGRAM: Access Code: [email or web address] URL: [email or web address]  06/28/23 TKE; prone hang 06/21/23 - Seated Calf Stretch with Strap  - 1-2 x daily - 7 x weekly - 3 reps - 30 hold  Date: 05/04/2023 Prepared by: [facility]  Exercises - Supine Ankle Pumps  - 2 x daily - 7 x weekly - 2 sets - 15 reps - Supine Quadricep Sets  - 2 x daily - 7 x weekly - 1 sets - 10 reps - 5 sec hold - Supine Short Arc Quad  - 2 x daily - 7 x weekly - 1 sets - 15 reps - Supine Heel Slides  - 2 x daily - 7 x weekly - 1 sets - 15 reps - Seated Long Arc Quad  - 2  x daily - 7 x weekly - 1 sets - 15 reps - Seated Knee Flexion Stretch  - 2 x daily - 7 x weekly - 1 sets - 15 reps - Seated Heel Toe Raises  - 2 x daily - 7 x weekly - 1 sets - 10 reps - Seated March  - 2 x daily - 7 x weekly - 2 sets - 10 reps  Patient Education - Total Knee Replacement Handout  05/09/23:  STS 05/27/23: - Squat with Chair and Counter Support  - 1 x daily - 7 x weekly - 2 sets - 10 reps - Heel Toe Raises with Counter Support  - 1 x daily - 7 x weekly - 3 sets - 10 reps  06/03/23: SLS ASSESSMENT:  CLINICAL IMPRESSION: Pt arrived without AD, continues to ambulate with extension lag.  Began session with reverse gait on TM with cueing for toe to heel mechanics and equal stride length to assist with knee extension during gait.  Continued with stretches and TKE based exercises to address weakness.  NO reports of increased pain through session.    Evaluation: Patient is a 72 y.o. female who was seen today for physical therapy evaluation and treatment for M17.11 (ICD-10-CM) - Primary osteoarthritis of right knee.  Patient demonstrates muscle  weakness, reduced ROM, and fascial restrictions which are likely contributing to symptoms of pain and are negatively impacting patient ability to perform ADLs and functional mobility tasks. Patient will benefit from skilled physical therapy services to address these deficits to reduce pain and improve level of function with ADLs and functional mobility tasks.   OBJECTIVE IMPAIRMENTS: Abnormal gait, decreased activity tolerance, decreased endurance, decreased mobility, difficulty walking, decreased ROM, decreased strength, hypomobility, increased edema, increased fascial restrictions, impaired perceived functional ability, impaired flexibility, and pain.   ACTIVITY LIMITATIONS: carrying, lifting, bending, sitting, standing, squatting, sleeping, stairs, transfers, bed mobility, and locomotion level  PARTICIPATION LIMITATIONS: meal prep, cleaning, laundry, driving, shopping, community activity, and yard work  [facility] POTENTIAL: Good  CLINICAL DECISION MAKING: Stable/uncomplicated  EVALUATION COMPLEXITY: Low   GOALS: Goals reviewed with patient? No  SHORT TERM GOALS: Target date: 05/18/2023 patient will be independent with initial HEP  Baseline:  05/27/23:  Reports compliance with HEP Goal status: MET  2.  Patient will self report 30% improvement to improve tolerance for functional activity  Baseline:  05/27/23:  Reports improvements by 85% Goal status: 05/27/23:  Reports improvements by 85%   LONG TERM GOALS: Target date:07/14/2023  Patient will be independent in self management strategies to improve quality of life and functional outcomes.  Baseline: 05/27/23:  Reports improvements by 85% Goal status: MET   2.  Patient will self report 50% improvement to improve tolerance for functional activity  Baseline: 05/27/23:  Reports improvements by 85% Goal status: IN PROGRESS  3.  Patient will increase right knee mobility to -5 to 120 to promote normal navigation of steps; step over step  pattern  Baseline: 05/27/23: 6-116 degrees; -6 to 120 8/29 Goal status: IN PROGRESS  4.  Patient will increase right leg MMTs to 5/5 without pain to promote return to ambulation community distances with minimal deviation.  Baseline: 05/27/23:  see above Goal status: IN PROGRESS  5.  Patient will increase distance on to 225 ft with LRAD  to demonstrate improved functional mobility walking household and community distances.   Baseline: 178 ft 05/27/23:  280ft no AD Goal status: MET  6.  Patient will improve 5 times sit  to stand score from 20.55 sec to 15 sec without UE assist  to demonstrate improved functional mobility and increased lower extremity strength.  Baseline: 05/27/23:  5 sts 11.75" Goal status: met  7.  Patient will improve FOTO by 10 points  Baseline: 50  Goal status: met   PLAN:  PT FREQUENCY: 2x/week  PT DURATION: 4 weeks  PLANNED INTERVENTIONS: Therapeutic exercises, Therapeutic activity, Neuromuscular re-education, Balance training, Gait training, Patient/Family education, Joint manipulation, Joint mobilization, Stair training, Orthotic/Fit training, DME instructions, Aquatic Therapy, Dry Needling, Electrical stimulation, Spinal manipulation, Spinal mobilization, Cryotherapy, Moist heat, Compression bandaging, scar mobilization, Splintting, Taping, Traction, Ultrasound, Ionotophoresis 4mg /ml Dexamethasone, and Manual therapy   PLAN FOR NEXT SESSION: Progress right knee mobility and strength as able; gait, balance as appropriate.  Continue for 3 more weeks to address goals unmet.  Becky Sax, LPTA/CLT; CBIS [phone]  Juel Burrow, PTA 07/05/2023, 1:52 PM  1:52 PM, 07/05/23

## 2023-07-07 ENCOUNTER — Encounter (HOSPITAL_COMMUNITY): Payer: Self-pay

## 2023-07-07 ENCOUNTER — Ambulatory Visit (HOSPITAL_COMMUNITY): Payer: Medicare Other

## 2023-07-07 DIAGNOSIS — M25661 Stiffness of right knee, not elsewhere classified: Secondary | ICD-10-CM | POA: Diagnosis not present

## 2023-07-07 DIAGNOSIS — G8929 Other chronic pain: Secondary | ICD-10-CM

## 2023-07-07 DIAGNOSIS — R262 Difficulty in walking, not elsewhere classified: Secondary | ICD-10-CM

## 2023-07-07 DIAGNOSIS — M1711 Unilateral primary osteoarthritis, right knee: Secondary | ICD-10-CM | POA: Diagnosis not present

## 2023-07-07 DIAGNOSIS — M25561 Pain in right knee: Secondary | ICD-10-CM | POA: Diagnosis not present

## 2023-07-07 NOTE — Therapy (Signed)
OUTPATIENT PHYSICAL THERAPY LOWER EXTREMITY TREATMENT   Patient Name: Kelly Fox MRN: [id number] DOB:[birth year], 71 y.o., female Today's Date: 07/07/2023  END OF SESSION:  PT End of Session - 07/07/23 1431     Visit Number 17    Number of Visits 22    Date for PT Re-Evaluation 07/14/23    Authorization Type [employer]    Progress Note Due on Visit 20    PT Start Time 1358   late arrival   PT Stop Time 1428    PT Time Calculation (min) 30 min    Activity Tolerance Patient tolerated treatment well    Behavior During Therapy [facility] for tasks assessed/performed                     Past Medical History:  Diagnosis Date   Asthma    Past Surgical History:  Procedure Laterality Date   DENTAL SURGERY     KNEE ARTHROSCOPY Left 10/29/2022   Procedure: ARTHROSCOPY KNEE LEFT FOR SCAR TISSUE DEBRIDEMENT;  Surgeon: Vickki Hearing, MD;  Location: [facility];  Service: Orthopedics;  Laterality: Left;   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/02/2022   Procedure: TOTAL KNEE ARTHROPLASTY;  Surgeon: Vickki Hearing, MD;  Location: [facility];  Service: Orthopedics;  Laterality: Left;   TOTAL KNEE ARTHROPLASTY Right 04/19/2023   Procedure: TOTAL KNEE ARTHROPLASTY;  Surgeon: Vickki Hearing, MD;  Location: [facility];  Service: Orthopedics;  Laterality: Right;   Patient Active Problem List   Diagnosis Date Noted   Primary osteoarthritis of right knee 04/19/2023   Primary localized osteoarthritis of right knee 04/19/2023   Status post total left knee replacement February 02, 2022 01/28/2023   Patellar clunk syndrome of left knee 10/29/2022   Osteoarthritis of left knee 02/02/2022   Unilateral primary osteoarthritis, left knee    Elevated blood-pressure reading without diagnosis of hypertension 12/03/2021   Seasonal allergic rhinitis 12/03/2021    PCP: Nita Sells, MD  REFERRING PROVIDER: Vickki Hearing, MD  REFERRING DIAG: M17.11 (ICD-10-CM) - Primary osteoarthritis of right  knee  THERAPY DIAG:  Chronic pain of right knee  Difficulty in walking, not elsewhere classified  Stiffness of right knee, not elsewhere classified  Osteoarthritis of right knee, unspecified osteoarthritis type  Rationale for Evaluation and Treatment: Rehabilitation  ONSET DATE: 04/19/23  SUBJECTIVE:   SUBJECTIVE STATEMENT: Late for session today.  Knee feels good today, no reports of pain currently.  Does have periodic stabbing pain on occasion.  Eval:  Patient with right knee OA for long time; had left knee done April of last year; s/p R TKA 04/18/23 per Dr. Romeo Apple at [facility]. Had home health up until yesterday.  CPM  PERTINENT HISTORY: Left knee TKA April 23 PAIN:  Are you having pain? Yes: NPRS scale: 5/10 Pain location: right knee Pain description: really sore Aggravating factors: being still, bending Relieving factors: massage, pain meds  PRECAUTIONS: None  WEIGHT BEARING RESTRICTIONS: No  FALLS:  Has patient fallen in last 6 months? No  OCCUPATION: retired  PLOF: Independent  PATIENT GOALS: walk straight without a limp or pain  NEXT MD VISIT: 7/11 staple removal   OBJECTIVE:   DIAGNOSTIC FINDINGS:  CLINICAL DATA:  Status post right total knee replacement   EXAM: PORTABLE RIGHT KNEE - 1-2 VIEW   COMPARISON:  03/28/2023   FINDINGS: Changes of right knee replacement. No hardware or bony complicating feature. Soft tissue and joint space gas noted. Anterior skin staples.   IMPRESSION:  Right knee replacement.  No visible complicating feature.    PATIENT SURVEYS:  FOTO 50  COGNITION: Overall cognitive status: Within functional limits for tasks assessed     SENSATION: WFL  EDEMA:  Yes normal for this time s/p; noted bruising right lower leg especially   PALPATION: General soreness right knee  LOWER EXTREMITY ROM:  Active ROM Right eval Left eval Right 05/09/23 Right 05/23/23 Right 06/23/23  Hip flexion       Hip extension       Hip  abduction       Hip adduction       Hip internal rotation       Hip external rotation       Knee flexion 100  108 115 120  Knee extension -16  8 Lacking 6 Lacking 6  Ankle dorsiflexion       Ankle plantarflexion       Ankle inversion       Ankle eversion        (Blank rows = not tested)  LOWER EXTREMITY MMT:  MMT Right eval Left eval Right 05/23/23 05/27/23: Right 06/23/23 Right   Hip flexion 3-  4+  5  Hip extension    3+ 4  Hip abduction    4/5    Hip adduction       Hip internal rotation       Hip external rotation       Knee flexion   4+ (isometric midrange) 4+ prone 5  Knee extension 2+  4+  4+  Ankle dorsiflexion 3+  4+    Ankle plantarflexion       Ankle inversion       Ankle eversion        (Blank rows = not tested)  FUNCTIONAL TESTS:  5 times sit to stand: 20.55 sec with UE assist 2 minute walk test: 178 ft with RW  GAIT: Distance walked: 178 ft Assistive device utilized: Environmental consultant - 2 wheeled Level of assistance: SBA Comments: antalgic gait; decreased stance right LE   TODAY'S TREATMENT:                                                                                                                              DATE:  07/07/23: Heel walk 1RT down black line Reverse gait on treadmill 5' at .7-.8spm TKE 3Pl 2x 10 Walk out retro and sidestep 5RT 3Pl Hamstring stretch on 2nd step (14in) 3x 30" Slant board 3x 30"  07/05/23: Reverse gait on treadmill 4' at .6-.7spm Heel raises on incline slope 10x 2 5" holds Slant board 3x 30" Squat with gluteal and quad squeeze at the end 10x TKE 2Pl 15x 5"  06/28/23 Bike seat 8 full revolution x 5' dynamic warm up  Standing: Slant board 5 x 20" Hamstring stretch 5 x 20" TKE BTB x 15 Heel raises on incline x 20  Sit to stand with left foot advanced from mat  table 2 x10  Sitting  LAQ 2# 2" pause 2 x 10  Prone hang x 2'  06/23/23 Bike seat 8 full revolution x 5' dynamic warm up Progress note 5 times sit to  stand 9.02 sec using hands on thighs 2 MWT 315 ft no AD FOTO 63 MMT's and AROM Gait training with SPC Slant board 5 x 20"  06/21/23: Bike seat 8 full revolution x 5' dynamic warm up Squat to heel raise 2x 10 while holding on to // bars  TKE on Body craft 2Pl 2x 10 5" holds Bodycraft walk out 3Pl 5RT retro and sidestep Vectors 10X5" each LE with UE assist. Seated hamstrings stretch x 30" x 3 Gastrocnemius slant board stretch x 30" x 3  06/08/23: Bike seat 8 full revolution x 5' dynamic warm up Squat to heel raise 2x 10 front of chair  TKE on Body craft 2Pl 2x 10 5" holds Bodycraft walk out 3Pl 5RT retro and sidestep Vectors 10X5" each LE with UE assist.  06/03/23: Bike seat 8 full revolution x 5' dynamic warm up Squat to heel raise 2x 10 front of chair with cueingfor mechanics Toe raise decline slope 15x TKE on Body craft 2Pl 2x 10 5" holds Bodycraft walk out 3Pl 5RT retro and sidestep 6in step up 15 Rt LE Lateral step up 4in 15x Step down 4in 15x SLS Lt 11, Rt 7" max 12in hamstring stretch 3x 30" Slant board 3x 30" Leg press 2x 10 3Pl  06/01/23 Bike seat 8 full revolution x 5' dynamic warm up Heel walking 1RT Squat to heel raise 2x 10 front of chair with cueingfor mechanics TKE GTB x 20x Leg press 2x 10 3Pl 4in step up 2x 10 Rt LE 4in lateral step up Rt LE 15x 12in hamstring stretch 3x 30" Slant board 3x 30" Bodycraft walk out 3Pl 5RT retro and sidestep  05/30/23 Bike seat 8 full revolution x 5' dynamic warm up  Heel/toe raises x 20 Slant board 5 x 20" 12" box knee drives for knee flexion x 2' Hamstring stretch on 12" box 5 x 20" Squats to chair target 2 x 10 4" step ups  right leg leading 2 x 10 GTB TKE's x 20   05/27/23: Bike seat 8 full revolution x 5' dynamic warm up 234ft no AD MMT see above ROM measurement 6-116 degrees 5STS 18.03", 2nd set 11.75" no HHA Reciprocal pattern stairs 7in 5RT   05/04/23 physical therapy evaluation and HEP instruction     PATIENT EDUCATION:  Education details: Patient educated on exam findings, POC, scope of PT, HEP, and what to expect next visit. Person educated: Patient Education method: Explanation, Demonstration, and Handouts Education comprehension: verbalized understanding, returned demonstration, verbal cues required, and tactile cues required  HOME EXERCISE PROGRAM: Access Code: [email or web address] URL: [email or web address]  06/28/23 TKE; prone hang 06/21/23 - Seated Calf Stretch with Strap  - 1-2 x daily - 7 x weekly - 3 reps - 30 hold  Date: 05/04/2023 Prepared by: [facility]  Exercises - Supine Ankle Pumps  - 2 x daily - 7 x weekly - 2 sets - 15 reps - Supine Quadricep Sets  - 2 x daily - 7 x weekly - 1 sets - 10 reps - 5 sec hold - Supine Short Arc Quad  - 2 x daily - 7 x weekly - 1 sets - 15 reps - Supine Heel Slides  - 2 x daily - 7 x weekly - 1 sets - 15  reps - Seated Long Arc Quad  - 2 x daily - 7 x weekly - 1 sets - 15 reps - Seated Knee Flexion Stretch  - 2 x daily - 7 x weekly - 1 sets - 15 reps - Seated Heel Toe Raises  - 2 x daily - 7 x weekly - 1 sets - 10 reps - Seated March  - 2 x daily - 7 x weekly - 2 sets - 10 reps  Patient Education - Total Knee Replacement Handout  05/09/23:  STS 05/27/23: - Squat with Chair and Counter Support  - 1 x daily - 7 x weekly - 2 sets - 10 reps - Heel Toe Raises with Counter Support  - 1 x daily - 7 x weekly - 3 sets - 10 reps  06/03/23: SLS ASSESSMENT:  CLINICAL IMPRESSION: Continued session focus addressing extension lag.  Increase grade during retrogait on TM with good extension noted on TM, cueing to continue heel strike with TKE during normal gait.  No reports of pain through session.    Evaluation: Patient is a 72 y.o. female who was seen today for physical therapy evaluation and treatment for M17.11 (ICD-10-CM) - Primary osteoarthritis of right knee.  Patient demonstrates muscle weakness, reduced ROM, and fascial restrictions  which are likely contributing to symptoms of pain and are negatively impacting patient ability to perform ADLs and functional mobility tasks. Patient will benefit from skilled physical therapy services to address these deficits to reduce pain and improve level of function with ADLs and functional mobility tasks.   OBJECTIVE IMPAIRMENTS: Abnormal gait, decreased activity tolerance, decreased endurance, decreased mobility, difficulty walking, decreased ROM, decreased strength, hypomobility, increased edema, increased fascial restrictions, impaired perceived functional ability, impaired flexibility, and pain.   ACTIVITY LIMITATIONS: carrying, lifting, bending, sitting, standing, squatting, sleeping, stairs, transfers, bed mobility, and locomotion level  PARTICIPATION LIMITATIONS: meal prep, cleaning, laundry, driving, shopping, community activity, and yard work  [facility] POTENTIAL: Good  CLINICAL DECISION MAKING: Stable/uncomplicated  EVALUATION COMPLEXITY: Low   GOALS: Goals reviewed with patient? No  SHORT TERM GOALS: Target date: 05/18/2023 patient will be independent with initial HEP  Baseline:  05/27/23:  Reports compliance with HEP Goal status: MET  2.  Patient will self report 30% improvement to improve tolerance for functional activity  Baseline:  05/27/23:  Reports improvements by 85% Goal status: 05/27/23:  Reports improvements by 85%   LONG TERM GOALS: Target date:07/14/2023  Patient will be independent in self management strategies to improve quality of life and functional outcomes.  Baseline: 05/27/23:  Reports improvements by 85% Goal status: MET   2.  Patient will self report 50% improvement to improve tolerance for functional activity  Baseline: 05/27/23:  Reports improvements by 85% Goal status: IN PROGRESS  3.  Patient will increase right knee mobility to -5 to 120 to promote normal navigation of steps; step over step pattern  Baseline: 05/27/23: 6-116 degrees; -6 to 120  8/29 Goal status: IN PROGRESS  4.  Patient will increase right leg MMTs to 5/5 without pain to promote return to ambulation community distances with minimal deviation.  Baseline: 05/27/23:  see above Goal status: IN PROGRESS  5.  Patient will increase distance on to 225 ft with LRAD  to demonstrate improved functional mobility walking household and community distances.   Baseline: 178 ft 05/27/23:  249ft no AD Goal status: MET  6.  Patient will improve 5 times sit to stand score from 20.55 sec to 15 sec without  UE assist  to demonstrate improved functional mobility and increased lower extremity strength.  Baseline: 05/27/23:  5 sts 11.75" Goal status: met  7.  Patient will improve FOTO by 10 points  Baseline: 50  Goal status: met   PLAN:  PT FREQUENCY: 2x/week  PT DURATION: 4 weeks  PLANNED INTERVENTIONS: Therapeutic exercises, Therapeutic activity, Neuromuscular re-education, Balance training, Gait training, Patient/Family education, Joint manipulation, Joint mobilization, Stair training, Orthotic/Fit training, DME instructions, Aquatic Therapy, Dry Needling, Electrical stimulation, Spinal manipulation, Spinal mobilization, Cryotherapy, Moist heat, Compression bandaging, scar mobilization, Splintting, Taping, Traction, Ultrasound, Ionotophoresis 4mg /ml Dexamethasone, and Manual therapy   PLAN FOR NEXT SESSION: Progress right knee mobility and strength as able; gait, balance as appropriate.    Reassess next week for probable DC to HEP.    Becky Sax, LPTA/CLT; CBIS [phone]  Juel Burrow, PTA 07/07/2023, 2:32 PM  2:32 PM, 07/07/23

## 2023-07-12 ENCOUNTER — Encounter (HOSPITAL_COMMUNITY): Payer: Medicare Other

## 2023-07-14 ENCOUNTER — Ambulatory Visit (HOSPITAL_COMMUNITY): Payer: Medicare Other

## 2023-07-14 DIAGNOSIS — R262 Difficulty in walking, not elsewhere classified: Secondary | ICD-10-CM | POA: Diagnosis not present

## 2023-07-14 DIAGNOSIS — M1711 Unilateral primary osteoarthritis, right knee: Secondary | ICD-10-CM

## 2023-07-14 DIAGNOSIS — M25661 Stiffness of right knee, not elsewhere classified: Secondary | ICD-10-CM | POA: Diagnosis not present

## 2023-07-14 DIAGNOSIS — G8929 Other chronic pain: Secondary | ICD-10-CM

## 2023-07-14 DIAGNOSIS — M25561 Pain in right knee: Secondary | ICD-10-CM | POA: Diagnosis not present

## 2023-07-14 NOTE — Therapy (Signed)
OUTPATIENT PHYSICAL THERAPY LOWER EXTREMITY TREATMENT/DISCHARGE NOTE PHYSICAL THERAPY DISCHARGE SUMMARY  Visits from Start of Care: 18  Current functional level related to goals / functional outcomes: See below   Remaining deficits: See below   Education / Equipment: HEP   Patient agrees to discharge. Patient goals were met. Patient is being discharged due to meeting the stated rehab goals.    Patient Name: Kelly Fox MRN: [id number] DOB:1951/05/08, 72 y.o., female Today's Date: 07/14/2023  END OF SESSION:  PT End of Session - 07/14/23 1018     Visit Number 18    Number of Visits 22    Date for PT Re-Evaluation 07/14/23    Authorization Type [employer]    Progress Note Due on Visit 20    PT Start Time 1016    PT Stop Time 1054    PT Time Calculation (min) 38 min    Activity Tolerance Patient tolerated treatment well    Behavior During Therapy [facility] for tasks assessed/performed                     Past Medical History:  Diagnosis Date   Asthma    Past Surgical History:  Procedure Laterality Date   DENTAL SURGERY     KNEE ARTHROSCOPY Left 10/29/2022   Procedure: ARTHROSCOPY KNEE LEFT FOR SCAR TISSUE DEBRIDEMENT;  Surgeon: Vickki Hearing, MD;  Location: [facility];  Service: Orthopedics;  Laterality: Left;   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/02/2022   Procedure: TOTAL KNEE ARTHROPLASTY;  Surgeon: Vickki Hearing, MD;  Location: [facility];  Service: Orthopedics;  Laterality: Left;   TOTAL KNEE ARTHROPLASTY Right 04/19/2023   Procedure: TOTAL KNEE ARTHROPLASTY;  Surgeon: Vickki Hearing, MD;  Location: [facility];  Service: Orthopedics;  Laterality: Right;   Patient Active Problem List   Diagnosis Date Noted   Primary osteoarthritis of right knee 04/19/2023   Primary localized osteoarthritis of right knee 04/19/2023   Status post total left knee replacement February 02, 2022 01/28/2023   Patellar clunk syndrome of left knee 10/29/2022   Osteoarthritis of  left knee 02/02/2022   Unilateral primary osteoarthritis, left knee    Elevated blood-pressure reading without diagnosis of hypertension 12/03/2021   Seasonal allergic rhinitis 12/03/2021    PCP: Nita Sells, MD  REFERRING PROVIDER: Vickki Hearing, MD  REFERRING DIAG: M17.11 (ICD-10-CM) - Primary osteoarthritis of right knee  THERAPY DIAG:  Chronic pain of right knee  Difficulty in walking, not elsewhere classified  Stiffness of right knee, not elsewhere classified  Osteoarthritis of right knee, unspecified osteoarthritis type  Rationale for Evaluation and Treatment: Rehabilitation  ONSET DATE: 04/19/23  SUBJECTIVE:   SUBJECTIVE STATEMENT: Reports no pain today; overall much better; knee still a little stiff with extension but thinks it is how she slept. "80%" better   Eval:  Patient with right knee OA for long time; had left knee done April of last year; s/p R TKA 04/18/23 per Dr. Romeo Apple at [facility]. Had home health up until yesterday.  CPM  PERTINENT HISTORY: Left knee TKA April 23 PAIN:  Are you having pain? Yes: NPRS scale: 5/10 Pain location: right knee Pain description: really sore Aggravating factors: being still, bending Relieving factors: massage, pain meds  PRECAUTIONS: None  WEIGHT BEARING RESTRICTIONS: No  FALLS:  Has patient fallen in last 6 months? No  OCCUPATION: retired  PLOF: Independent  PATIENT GOALS: walk straight without a limp or pain  NEXT MD VISIT: 7/11 staple removal  OBJECTIVE:   DIAGNOSTIC FINDINGS:  CLINICAL DATA:  Status post right total knee replacement   EXAM: PORTABLE RIGHT KNEE - 1-2 VIEW   COMPARISON:  03/28/2023   FINDINGS: Changes of right knee replacement. No hardware or bony complicating feature. Soft tissue and joint space gas noted. Anterior skin staples.   IMPRESSION: Right knee replacement.  No visible complicating feature.    PATIENT SURVEYS:  FOTO 50  COGNITION: Overall cognitive status: Within  functional limits for tasks assessed     SENSATION: WFL  EDEMA:  Yes normal for this time s/p; noted bruising right lower leg especially   PALPATION: General soreness right knee  LOWER EXTREMITY ROM:  Active ROM Right eval Left eval Right 05/09/23 Right 05/23/23 Right 06/23/23 Right 07/14/23  Hip flexion        Hip extension        Hip abduction        Hip adduction        Hip internal rotation        Hip external rotation        Knee flexion 100  108 115 120 126  Knee extension -16  8 Lacking 6 Lacking 6 Lacking 5  Ankle dorsiflexion        Ankle plantarflexion        Ankle inversion        Ankle eversion         (Blank rows = not tested)  LOWER EXTREMITY MMT:  MMT Right eval Left eval Right 05/23/23 05/27/23: Right 06/23/23 Right  07/14/23 right  Hip flexion 3-  4+  5 5  Hip extension    3+ 4   Hip abduction    4/5     Hip adduction        Hip internal rotation        Hip external rotation        Knee flexion   4+ (isometric midrange) 4+ prone 5 5  Knee extension 2+  4+  4+ 5  Ankle dorsiflexion 3+  4+     Ankle plantarflexion        Ankle inversion        Ankle eversion         (Blank rows = not tested)  FUNCTIONAL TESTS:  5 times sit to stand: 20.55 sec with UE assist 2 minute walk test: 178 ft with RW  GAIT: Distance walked: 178 ft Assistive device utilized: Walker - 2 wheeled Level of assistance: SBA Comments: antalgic gait; decreased stance right LE   TODAY'S TREATMENT:                                                                                                                              DATE:  07/14/23 Progress note FOTO 67 AROM and MMT's see above 2 MWT 330 ft without AD 5 times sit to stand 10.19 sec  07/07/23: Heel walk 1RT down black line Reverse  gait on treadmill 5' at .7-.8spm TKE 3Pl 2x 10 Walk out retro and sidestep 5RT 3Pl Hamstring stretch on 2nd step (14in) 3x 30" Slant board 3x 30"  07/05/23: Reverse gait on  treadmill 4' at .6-.7spm Heel raises on incline slope 10x 2 5" holds Slant board 3x 30" Squat with gluteal and quad squeeze at the end 10x TKE 2Pl 15x 5"  06/28/23 Bike seat 8 full revolution x 5' dynamic warm up  Standing: Slant board 5 x 20" Hamstring stretch 5 x 20" TKE BTB x 15 Heel raises on incline x 20  Sit to stand with left foot advanced from mat table 2 x10  Sitting  LAQ 2# 2" pause 2 x 10  Prone hang x 2'  06/23/23 Bike seat 8 full revolution x 5' dynamic warm up Progress note 5 times sit to stand 9.02 sec using hands on thighs 2 MWT 315 ft no AD FOTO 63 MMT's and AROM Gait training with SPC Slant board 5 x 20"  06/21/23: Bike seat 8 full revolution x 5' dynamic warm up Squat to heel raise 2x 10 while holding on to // bars  TKE on Body craft 2Pl 2x 10 5" holds Bodycraft walk out 3Pl 5RT retro and sidestep Vectors 10X5" each LE with UE assist. Seated hamstrings stretch x 30" x 3 Gastrocnemius slant board stretch x 30" x 3  06/08/23: Bike seat 8 full revolution x 5' dynamic warm up Squat to heel raise 2x 10 front of chair  TKE on Body craft 2Pl 2x 10 5" holds Bodycraft walk out 3Pl 5RT retro and sidestep Vectors 10X5" each LE with UE assist.  06/03/23: Bike seat 8 full revolution x 5' dynamic warm up Squat to heel raise 2x 10 front of chair with cueingfor mechanics Toe raise decline slope 15x TKE on Body craft 2Pl 2x 10 5" holds Bodycraft walk out 3Pl 5RT retro and sidestep 6in step up 15 Rt LE Lateral step up 4in 15x Step down 4in 15x SLS Lt 11, Rt 7" max 12in hamstring stretch 3x 30" Slant board 3x 30" Leg press 2x 10 3Pl  06/01/23 Bike seat 8 full revolution x 5' dynamic warm up Heel walking 1RT Squat to heel raise 2x 10 front of chair with cueingfor mechanics TKE GTB x 20x Leg press 2x 10 3Pl 4in step up 2x 10 Rt LE 4in lateral step up Rt LE 15x 12in hamstring stretch 3x 30" Slant board 3x 30" Bodycraft walk out 3Pl 5RT retro and  sidestep  05/30/23 Bike seat 8 full revolution x 5' dynamic warm up  Heel/toe raises x 20 Slant board 5 x 20" 12" box knee drives for knee flexion x 2' Hamstring stretch on 12" box 5 x 20" Squats to chair target 2 x 10 4" step ups  right leg leading 2 x 10 GTB TKE's x 20   05/27/23: Bike seat 8 full revolution x 5' dynamic warm up 251ft no AD MMT see above ROM measurement 6-116 degrees 5STS 18.03", 2nd set 11.75" no HHA Reciprocal pattern stairs 7in 5RT   05/04/23 physical therapy evaluation and HEP instruction    PATIENT EDUCATION:  Education details: Patient educated on exam findings, POC, scope of PT, HEP, and what to expect next visit. Person educated: Patient Education method: Explanation, Demonstration, and Handouts Education comprehension: verbalized understanding, returned demonstration, verbal cues required, and tactile cues required  HOME EXERCISE PROGRAM: Access Code: [email or web address] URL: [email or web address]  06/28/23 TKE; prone hang 06/21/23 - Seated  Calf Stretch with Strap  - 1-2 x daily - 7 x weekly - 3 reps - 30 hold  Date: 05/04/2023 Prepared by: [facility] - [facility]hab  Exercises - Supine Ankle Pumps  - 2 x daily - 7 x weekly - 2 sets - 15 reps - Supine Quadricep Sets  - 2 x daily - 7 x weekly - 1 sets - 10 reps - 5 sec hold - Supine Short Arc Quad  - 2 x daily - 7 x weekly - 1 sets - 15 reps - Supine Heel Slides  - 2 x daily - 7 x weekly - 1 sets - 15 reps - Seated Long Arc Quad  - 2 x daily - 7 x weekly - 1 sets - 15 reps - Seated Knee Flexion Stretch  - 2 x daily - 7 x weekly - 1 sets - 15 reps - Seated Heel Toe Raises  - 2 x daily - 7 x weekly - 1 sets - 10 reps - Seated March  - 2 x daily - 7 x weekly - 2 sets - 10 reps  Patient Education - Total Knee Replacement Handout  05/09/23:  STS 05/27/23: - Squat with Chair and Counter Support  - 1 x daily - 7 x weekly - 2 sets - 10 reps - Heel Toe Raises with Counter Support  - 1 x daily - 7 x weekly - 3  sets - 10 reps  06/03/23: SLS ASSESSMENT:  CLINICAL IMPRESSION: Progress note today; excellent progress with all functional tests; patient has met all set rehab goals and is agreeable to discharge at this time.    Evaluation: Patient is a 72 y.o. female who was seen today for physical therapy evaluation and treatment for M17.11 (ICD-10-CM) - Primary osteoarthritis of right knee.  Patient demonstrates muscle weakness, reduced ROM, and fascial restrictions which are likely contributing to symptoms of pain and are negatively impacting patient ability to perform ADLs and functional mobility tasks. Patient will benefit from skilled physical therapy services to address these deficits to reduce pain and improve level of function with ADLs and functional mobility tasks.   OBJECTIVE IMPAIRMENTS: Abnormal gait, decreased activity tolerance, decreased endurance, decreased mobility, difficulty walking, decreased ROM, decreased strength, hypomobility, increased edema, increased fascial restrictions, impaired perceived functional ability, impaired flexibility, and pain.   ACTIVITY LIMITATIONS: carrying, lifting, bending, sitting, standing, squatting, sleeping, stairs, transfers, bed mobility, and locomotion level  PARTICIPATION LIMITATIONS: meal prep, cleaning, laundry, driving, shopping, community activity, and yard work  [facility] POTENTIAL: Good  CLINICAL DECISION MAKING: Stable/uncomplicated  EVALUATION COMPLEXITY: Low   GOALS: Goals reviewed with patient? No  SHORT TERM GOALS: Target date: 05/18/2023 patient will be independent with initial HEP  Baseline:  05/27/23:  Reports compliance with HEP Goal status: MET  2.  Patient will self report 30% improvement to improve tolerance for functional activity  Baseline:  05/27/23:  Reports improvements by 85% Goal status: 05/27/23:  Reports improvements by 85%   LONG TERM GOALS: Target date:07/14/2023  Patient will be independent in self management  strategies to improve quality of life and functional outcomes.  Baseline: 05/27/23:  Reports improvements by 85% Goal status: MET   2.  Patient will self report 50% improvement to improve tolerance for functional activity  Baseline: 05/27/23:  Reports improvements by 85% Goal status: met  3.  Patient will increase right knee mobility to -5 to 120 to promote normal navigation of steps; step over step pattern  Baseline: 05/27/23: 6-116 degrees; -6 to  120 8/29; -5 to 125 right knee Goal status:met  4.  Patient will increase right leg MMTs to 5/5 without pain to promote return to ambulation community distances with minimal deviation.  Baseline: 05/27/23:  see above Goal status: met  5.  Patient will increase distance on to 225 ft with LRAD  to demonstrate improved functional mobility walking household and community distances.   Baseline: 178 ft 05/27/23:  232ft no AD; 330 ft without AD 07/14/23 Goal status: MET  6.  Patient will improve 5 times sit to stand score from 20.55 sec to 15 sec without UE assist  to demonstrate improved functional mobility and increased lower extremity strength.  Baseline: 05/27/23:  5 sts 11.75"; 07/14/23 10.19 sec Goal status: met  7.  Patient will improve FOTO by 10 points  Baseline: 50; 67 07/14/23  Goal status: met   PLAN:  PT FREQUENCY: 2x/week  PT DURATION: 4 weeks  PLANNED INTERVENTIONS: Therapeutic exercises, Therapeutic activity, Neuromuscular re-education, Balance training, Gait training, Patient/Family education, Joint manipulation, Joint mobilization, Stair training, Orthotic/Fit training, DME instructions, Aquatic Therapy, Dry Needling, Electrical stimulation, Spinal manipulation, Spinal mobilization, Cryotherapy, Moist heat, Compression bandaging, scar mobilization, Splintting, Taping, Traction, Ultrasound, Ionotophoresis 4mg /ml Dexamethasone, and Manual therapy   PLAN FOR NEXT SESSION: discharge   10:54 AM, 07/14/23 Chrisann Melaragno Small Vashon Riordan MPT [facility] [place] [phone] Ph:[phone]

## 2023-07-18 ENCOUNTER — Encounter (HOSPITAL_COMMUNITY): Payer: Medicare Other

## 2023-07-21 ENCOUNTER — Encounter (HOSPITAL_COMMUNITY): Payer: Medicare Other

## 2023-07-27 ENCOUNTER — Other Ambulatory Visit: Payer: Self-pay | Admitting: Orthopedic Surgery

## 2023-07-27 DIAGNOSIS — Z96659 Presence of unspecified artificial knee joint: Secondary | ICD-10-CM

## 2023-08-01 ENCOUNTER — Other Ambulatory Visit: Payer: Self-pay | Admitting: Orthopedic Surgery

## 2023-08-01 ENCOUNTER — Ambulatory Visit: Payer: Medicare Other | Admitting: Orthopedic Surgery

## 2023-08-01 ENCOUNTER — Encounter: Payer: Self-pay | Admitting: Orthopedic Surgery

## 2023-08-01 ENCOUNTER — Other Ambulatory Visit (INDEPENDENT_AMBULATORY_CARE_PROVIDER_SITE_OTHER): Payer: Medicare Other

## 2023-08-01 DIAGNOSIS — M1710 Unilateral primary osteoarthritis, unspecified knee: Secondary | ICD-10-CM

## 2023-08-01 DIAGNOSIS — Z96652 Presence of left artificial knee joint: Secondary | ICD-10-CM

## 2023-08-01 DIAGNOSIS — Z96651 Presence of right artificial knee joint: Secondary | ICD-10-CM | POA: Diagnosis not present

## 2023-08-01 DIAGNOSIS — T8484XD Pain due to internal orthopedic prosthetic devices, implants and grafts, subsequent encounter: Secondary | ICD-10-CM

## 2023-08-01 NOTE — Progress Notes (Signed)
Chief Complaint  Patient presents with   Post-op Follow-up    April 11th 2023    Kelly Fox is having pain around the knee joint after her right total knee arthroplasty  Encounter Diagnoses  Name Primary?   Status post total left knee replacement February 02, 2022 Yes   Arthritis of knee    Pain due to total right knee replacement, subsequent encounter     She says it does not hurt as much as it is before but she primarily has 3 complaints  #1 soreness around the patellofemoral area #2 tightness in her hamstring and buttock area #3 no numbness but it feels like it is going to go numb  I asked to come back in for oblique images of the right total knee arthroplasty which were done I do not see any sign of loosening there are no signs of infection  Differential diagnosis Synovitis Early radiculitis  Recommend recheck in 3 months hopefully this will resolve on its own

## 2023-08-30 ENCOUNTER — Other Ambulatory Visit: Payer: Self-pay | Admitting: Orthopedic Surgery

## 2023-09-27 ENCOUNTER — Other Ambulatory Visit: Payer: Self-pay | Admitting: Orthopedic Surgery

## 2023-10-21 ENCOUNTER — Other Ambulatory Visit: Payer: Self-pay | Admitting: Orthopedic Surgery

## 2023-10-31 ENCOUNTER — Ambulatory Visit: Payer: Medicare Other | Admitting: Orthopedic Surgery

## 2023-10-31 ENCOUNTER — Encounter: Payer: Self-pay | Admitting: Orthopedic Surgery

## 2023-10-31 VITALS — BP 150/80 | HR 79 | Ht 62.0 in | Wt 186.0 lb

## 2023-10-31 DIAGNOSIS — Z96651 Presence of right artificial knee joint: Secondary | ICD-10-CM | POA: Diagnosis not present

## 2023-10-31 DIAGNOSIS — Z96652 Presence of left artificial knee joint: Secondary | ICD-10-CM | POA: Diagnosis not present

## 2023-10-31 NOTE — Progress Notes (Signed)
   BP (!) 150/80   Pulse 79   Ht 5' 2 (1.575 m)   Wt 186 lb (84.4 kg)   BMI 34.02 kg/m   Body mass index is 34.02 kg/m.  Chief Complaint  Patient presents with   Knee Pain    Right  when has knees bent on couch 04/19/23 TKR     Encounter Diagnoses  Name Primary?   Status post total left knee replacement February 02, 2022 Yes   Status post total right knee replacement April 19, 2023    73 year old female status post right total knee arthroplasty June 2024 still having issues with her right knee and right leg  She has several areas of pain primarily when lying on the couch on her side.  If she tries to lift her right leg up off the couch she has pain in the popliteal fossa and then she will have some pain on the medial and lateral side of the tibial plateau  On examination she walks fine.  She has full extension good flexion tenderness in the anterior thigh medial lateral soft tissue sleeve and popliteal fossa  No effusion  Assessment and plan  Unclear diagnosis  Differential diagnosis includes  Pain referred from another source back or hip although hip range of motion is normal  Synovitis  Micromotion.  Pain is relieved by tramadol  so we will continue the tramadol   Patient of x-rays of both knees in June and we will reassess again.

## 2023-11-18 ENCOUNTER — Other Ambulatory Visit: Payer: Self-pay | Admitting: Orthopedic Surgery

## 2023-12-20 ENCOUNTER — Other Ambulatory Visit: Payer: Self-pay | Admitting: Orthopedic Surgery

## 2024-01-10 ENCOUNTER — Other Ambulatory Visit: Payer: Self-pay | Admitting: Orthopedic Surgery

## 2024-01-11 DIAGNOSIS — Z0000 Encounter for general adult medical examination without abnormal findings: Secondary | ICD-10-CM | POA: Diagnosis not present

## 2024-01-18 DIAGNOSIS — Z0000 Encounter for general adult medical examination without abnormal findings: Secondary | ICD-10-CM | POA: Diagnosis not present

## 2024-01-18 DIAGNOSIS — Z0001 Encounter for general adult medical examination with abnormal findings: Secondary | ICD-10-CM | POA: Diagnosis not present

## 2024-01-18 DIAGNOSIS — R030 Elevated blood-pressure reading, without diagnosis of hypertension: Secondary | ICD-10-CM | POA: Diagnosis not present

## 2024-01-18 DIAGNOSIS — M25562 Pain in left knee: Secondary | ICD-10-CM | POA: Diagnosis not present

## 2024-01-18 DIAGNOSIS — Z13820 Encounter for screening for osteoporosis: Secondary | ICD-10-CM | POA: Diagnosis not present

## 2024-01-19 ENCOUNTER — Other Ambulatory Visit (INDEPENDENT_AMBULATORY_CARE_PROVIDER_SITE_OTHER): Payer: Self-pay

## 2024-01-19 ENCOUNTER — Ambulatory Visit: Admitting: Orthopedic Surgery

## 2024-01-19 DIAGNOSIS — M545 Low back pain, unspecified: Secondary | ICD-10-CM | POA: Diagnosis not present

## 2024-01-19 DIAGNOSIS — Z96651 Presence of right artificial knee joint: Secondary | ICD-10-CM

## 2024-01-19 DIAGNOSIS — M4316 Spondylolisthesis, lumbar region: Secondary | ICD-10-CM

## 2024-01-19 DIAGNOSIS — M238X1 Other internal derangements of right knee: Secondary | ICD-10-CM

## 2024-01-19 MED ORDER — CELECOXIB 200 MG PO CAPS: 200.0000 mg | ORAL_CAPSULE | Freq: Every day | ORAL | 0 refills | Status: DC

## 2024-01-19 MED ORDER — GABAPENTIN 100 MG PO CAPS: 100.0000 mg | ORAL_CAPSULE | Freq: Three times a day (TID) | ORAL | 2 refills | Status: DC

## 2024-01-19 NOTE — Progress Notes (Signed)
   There were no vitals taken for this visit.  There is no height or weight on file to calculate BMI.  Chief Complaint  Patient presents with   Knee Pain    R TKR DOS: 04/19/23.  Nonspecific pain in the right lower extremity right thigh right calf posterior aspect right knee anterior aspect right knee    Encounter Diagnosis  Name Primary?   Status post total right knee replacement April 19, 2023 Yes    DOI/DOS/ Date: R TKR 04/19/23

## 2024-01-19 NOTE — Progress Notes (Addendum)
  Chief Complaint  Patient presents with   Knee Pain    R TKR DOS: 04/19/23.  Nonspecific pain in the right lower extremity right thigh right calf posterior aspect right knee anterior aspect right knee    Encounter Diagnosis  Name Primary?   Status post total right knee replacement April 19, 2023 Yes    DOI/DOS/ Date: R TKR 04/19/23  Kelly Fox is 73 years old she has had bilateral total knees for severe varus deformities.  The left knee was replaced in April 2023 the right 1 in June 2024  She has vague nonspecific pain which is worse when sitting not present when walking.  This is now been present for over a year.  It is now recurrent.  It is intermittent in terms of intensity.  The pain as described in the chief complaint.  On examination she is tender in the anterior aspect of her right thigh medial soft tissues calf lumbar spine buttock posterior aspect of the right knee no effusion.  In extension very stable  In flexion mild laxity AP firm endpoint seems equal to the opposite side  Lumbar films DG Lumbar Spine 2-3 Views Result Date: 01/19/2024 Spinal exam Back pain Patient has spondylolisthesis at L4-5 some facet arthritis at L4-5 L5-S1 L3-4 Some increased lordosis As well as some coronal plane alignment asymmetry but no scoliosis Spondylolisthesis facet arthritis     Differential diagnosis  subtle flexion laxity,  lumbar spine related right lower extremity pain  Plan lumbar stabilization PT  Anti-inflammatories and gabapentin  Recheck 6 weeks

## 2024-01-25 DIAGNOSIS — M5459 Other low back pain: Secondary | ICD-10-CM | POA: Diagnosis not present

## 2024-01-27 DIAGNOSIS — M5459 Other low back pain: Secondary | ICD-10-CM | POA: Diagnosis not present

## 2024-01-30 DIAGNOSIS — M5459 Other low back pain: Secondary | ICD-10-CM | POA: Diagnosis not present

## 2024-02-06 DIAGNOSIS — M5459 Other low back pain: Secondary | ICD-10-CM | POA: Diagnosis not present

## 2024-02-08 DIAGNOSIS — M5459 Other low back pain: Secondary | ICD-10-CM | POA: Diagnosis not present

## 2024-02-13 DIAGNOSIS — M5459 Other low back pain: Secondary | ICD-10-CM | POA: Diagnosis not present

## 2024-02-15 DIAGNOSIS — M5459 Other low back pain: Secondary | ICD-10-CM | POA: Diagnosis not present

## 2024-02-19 ENCOUNTER — Other Ambulatory Visit: Payer: Self-pay | Admitting: Orthopedic Surgery

## 2024-02-27 DIAGNOSIS — M5459 Other low back pain: Secondary | ICD-10-CM | POA: Diagnosis not present

## 2024-02-29 DIAGNOSIS — M5459 Other low back pain: Secondary | ICD-10-CM | POA: Diagnosis not present

## 2024-03-20 ENCOUNTER — Other Ambulatory Visit: Payer: Self-pay | Admitting: Orthopedic Surgery

## 2024-03-20 DIAGNOSIS — M238X1 Other internal derangements of right knee: Secondary | ICD-10-CM

## 2024-03-29 ENCOUNTER — Ambulatory Visit: Admitting: Orthopedic Surgery

## 2024-03-30 ENCOUNTER — Ambulatory Visit: Payer: Medicare Other | Admitting: Orthopedic Surgery

## 2024-04-02 ENCOUNTER — Encounter: Payer: Self-pay | Admitting: Orthopedic Surgery

## 2024-04-02 ENCOUNTER — Other Ambulatory Visit: Payer: Self-pay

## 2024-04-02 ENCOUNTER — Other Ambulatory Visit (INDEPENDENT_AMBULATORY_CARE_PROVIDER_SITE_OTHER)

## 2024-04-02 ENCOUNTER — Ambulatory Visit: Admitting: Orthopedic Surgery

## 2024-04-02 DIAGNOSIS — Z96651 Presence of right artificial knee joint: Secondary | ICD-10-CM | POA: Diagnosis not present

## 2024-04-02 DIAGNOSIS — Z96652 Presence of left artificial knee joint: Secondary | ICD-10-CM | POA: Diagnosis not present

## 2024-04-02 DIAGNOSIS — M1711 Unilateral primary osteoarthritis, right knee: Secondary | ICD-10-CM

## 2024-04-02 DIAGNOSIS — M25861 Other specified joint disorders, right knee: Secondary | ICD-10-CM | POA: Diagnosis not present

## 2024-04-02 DIAGNOSIS — M1712 Unilateral primary osteoarthritis, left knee: Secondary | ICD-10-CM

## 2024-04-02 DIAGNOSIS — Z01818 Encounter for other preprocedural examination: Secondary | ICD-10-CM | POA: Diagnosis not present

## 2024-04-02 NOTE — Progress Notes (Signed)
 Routine annual follow-up for left and right total knee Chief Complaint  Patient presents with   Routine Post Op    Encounter Diagnoses  Name Primary?   Status post total right knee replacement April 19, 2023 Yes   Status post total left knee replacement February 02, 2022    Primary osteoarthritis of right knee    Primary osteoarthritis of left knee    Patellar clunk syndrome of right knee    Pre-op exam     DOI/DOS/ Date:    Patient complains of popping in the right knee similar to what she had on the left she also has some lower back and right hip pain  Exam of the right knee shows a well-healed incision the knee feels stable in coronal and sagittal plane she has crepitance and pain from flexion to extension of the right knee  Imaging shows no signs of loosening  DG Knee AP/LAT W/Sunrise Left Result Date: 04/02/2024 Left total knee report is in the right knee however normal alignment sizing with no signs of loosening impression normal left total knee   DG Knee AP/LAT W/Sunrise Right Result Date: 04/02/2024 Imaging of the right knee status post knee replacement fixed-bearing total knee attune posterior stabilized Normal articulation size and alignment patient has bowing of her femur everything else looks good no loosening Normal appearance of the right total knee Left kneeAgain same implant bowing of the femur normal alignment of the tibia where an patella with no signs of loosening sizes look appropriate Normal appearance of both total knees     We discussed possibility of a arthroscopy of the right knee as we did on the left to remove scar tissue and correct patellar clunk syndrome she is in agreement  Encounter Diagnoses  Name Primary?   Status post total right knee replacement April 19, 2023 Yes   Status post total left knee replacement February 02, 2022    Primary osteoarthritis of right knee    Primary osteoarthritis of left knee    Patellar clunk syndrome of right knee    Pre-op  exam

## 2024-04-02 NOTE — Patient Instructions (Signed)
 Your surgery will be at [facility] by Dr Phyllis Breeze on July 1st  The hospital will contact you with a preoperative appointment to discuss Anesthesia.  Please arrive on time or 15 minutes early for the preoperative appointment, they have a very tight schedule if you are late or do not come in your surgery will be cancelled.  The phone number is [phone]. Please bring your medications with you for the appointment. They will tell you the arrival time and medication instructions when you have your preoperative evaluation. Do not wear nail polish the day of your surgery and if you take Phentermine you need to stop this medication ONE WEEK prior to your surgery. If you take Invokana, Farxiga, Jardiance, or Steglatro) - Hold 72 hours before the procedure.  If you take Ozempic,  Mounjaro, Bydureon or Trulicity do not take for 8 days before your surgery. If you take Victoza, Rybelsis, Saxenda or Adlyxi stop 24 hours before the procedure.  Please arrive at the hospital 2 hours before procedure if scheduled at 9:30 or later in the day or at the time the nurse tells you at your preoperative visit.   If you have my chart do not use the time given in my chart use the time given to you by the nurse during your preoperative visit.   Your surgery  time may change. Please be available for phone calls the day of your surgery and the day before. The Short Stay department may need to discuss changes about your surgery time. Not reaching the you could lead to procedure delays and possible cancellation.  You must have a ride home and someone to stay with you for 24 to 48 hours. The person taking you home will receive and sign for the your discharge instructions.  Please be prepared to give your support person's name and telephone number to [facility] [employer]. Dr Phyllis Breeze will need that name and phone number post procedure.

## 2024-04-02 NOTE — Progress Notes (Signed)
   There were no vitals taken for this visit.  There is no height or weight on file to calculate BMI.  Chief Complaint  Patient presents with   Routine Post Op    Encounter Diagnoses  Name Primary?   Status post total right knee replacement April 19, 2023 Yes   Status post total left knee replacement February 02, 2022    Primary osteoarthritis of right knee    Primary osteoarthritis of left knee     DOI/DOS/ Date:    Unchanged  Has popping and pain of right knee still/ left knee feels good

## 2024-04-10 ENCOUNTER — Other Ambulatory Visit: Payer: Self-pay | Admitting: Orthopedic Surgery

## 2024-04-10 DIAGNOSIS — H2513 Age-related nuclear cataract, bilateral: Secondary | ICD-10-CM | POA: Diagnosis not present

## 2024-04-10 DIAGNOSIS — H35411 Lattice degeneration of retina, right eye: Secondary | ICD-10-CM | POA: Diagnosis not present

## 2024-04-18 NOTE — Patient Instructions (Addendum)
 Kelly Fox  04/18/2024       Your procedure is scheduled on Tuesday April 24, 2024.   Report to [facility] Main Entrance at 10:15 A.M.   Call this number if you have problems the morning of surgery:  [phone]  If you experience any cold or flu symptoms such as cough, fever, chills, shortness of breath, etc. between now and your scheduled surgery, please notify us  at the above number.   Remember:   Do not eat after midnight.  Patient Instructions  The night before surgery:  No food after midnight. ONLY clear liquids after midnight  The day of surgery (if you do NOT have diabetes):  Drink ONE (1) Pre-Surgery Clear Ensure by 8:15 am the morning of surgery. Drink in one sitting. Do not sip.  This drink was given to you during your hospital  pre-op appointment visit.  Nothing else to drink after completing the  Pre-Surgery Clear Ensure.      If you have questions, please contact your surgeon's office.   You may drink clear liquids until 8:15 am .  Clear liquids allowed are:  Water, Juice (No red color; non-citric and without pulp; diabetics please choose diet or no sugar options), Carbonated beverages (diabetics please choose diet or no sugar options), Clear Tea (No creamer, milk, or cream, including half & half and powdered creamer), Black Coffee Only (No creamer, milk or cream, including half & half and powdered creamer), and Clear Sports drink (No red color; diabetics please choose diet or no sugar options)     Take these medicines the morning of surgery with A SIP OF WATER= tramadol     Do not wear jewelry, make-up or nail polish, including gel polish,  artificial nails, or any other type of covering on natural nails (fingers and  toes).  Do not wear lotions, powders, or perfumes, or deodorant.  Do not shave 48 hours prior to surgery.   Do not bring valuables to the hospital.  [facility] is not responsible for any belongings or valuables.  Contacts,  dentures or bridgework may not be worn into surgery.  Leave your suitcase in the car.  After surgery it may be brought to your room.  For patients admitted to the hospital, discharge time will be determined by your treatment team.  Patients discharged the day of surgery will not be allowed to drive home and must have someone with them for 24 hours.   Special instructions:  DO NOT SMOKE TOBACCO OR VAPE 24 HOURS PRIOR TO YOUR PROCEDURE.    Please read over the following fact sheets that you were given. Anesthesia Post-op Instructions and Care and Recovery After Surgery  How to Use Chlorhexidine  at Home in the Shower Chlorhexidine  gluconate (CHG) is a germ-killing (antiseptic) wash that's used to clean the skin. It can get rid of the germs that normally live on the skin and can keep them away for about 24 hours. If you're having surgery, you may be told to shower with CHG at home the night before surgery. This can help lower your risk for infection. To use CHG wash in the shower, follow the steps below. Supplies needed: CHG body wash. Clean washcloth. Clean towel. How to use CHG in the shower Follow these steps unless you're told to use CHG in a different way: Start the shower. Use your normal soap and shampoo to wash your face and hair. Turn off the shower or move out of the shower stream. Pour  CHG onto a clean washcloth. Do not use any type of brush or rough sponge. Start at your neck, washing your body down to your toes. Make sure you: Wash the part of your body where the surgery will be done for at least 1 minute. Do not scrub. Do not use CHG on your head or face unless your health care provider tells you to. If it gets into your ears or eyes, rinse them well with water. Do not wash your genitals with CHG. Wash your back and under your arms. Make sure to wash skin folds. Let the CHG sit on your skin for 1-2 minutes or as long as told. Rinse your entire body in the shower, including  all body creases and folds. Turn off the shower. Dry off with a clean towel. Do not put anything on your skin afterward, such as powder, lotion, or perfume. Put on clean clothes or pajamas. If it's the night before surgery, sleep in clean sheets. General tips Use CHG only as told, and follow the instructions on the label. Use the full amount of CHG as told. This is often one bottle. Do not smoke and stay away from flames after using CHG. Your skin may feel sticky after using CHG. This is normal. The sticky feeling will go away as the CHG dries. Do not use CHG: If you have a chlorhexidine  allergy or have reacted to chlorhexidine  in the past. On open wounds or areas of skin that have broken skin, cuts, or scrapes. On babies younger than 27 months of age. Contact a health care provider if: You have questions about using CHG. Your skin gets irritated or itchy. You have a rash after using CHG. You swallow any CHG. Call your local poison control center [phone] in the [place] Your eyes itch badly, or they become very red or swollen. Your hearing changes. You have trouble seeing. If you can't reach your provider, go to an urgent care or emergency room. Do not drive yourself. Get help right away if: You have swelling or tingling in your mouth or throat. You make high-pitched whistling sounds when you breathe, most often when you breathe out (wheeze). You have trouble breathing. These symptoms may be an emergency. Call 911 right away. Do not wait to see if the symptoms will go away. Do not drive yourself to the hospital. This information is not intended to replace advice given to you by your health care provider. Make sure you discuss any questions you have with your health care provider. Document Revised: 04/26/2023 Document Reviewed: 04/22/2022 Elsevier Patient Education  2024 [employer].   How to Use an Incentive Spirometer An incentive spirometer is a tool that measures how well  you are filling your lungs with each breath. Learning to take long, deep breaths using this tool can help you keep your lungs clear and active. This may help to reverse or lessen your chance of developing breathing (pulmonary) problems, especially infection. You may be asked to use a spirometer: After a surgery. If you have a lung problem or a history of smoking. After a long period of time when you have been unable to move or be active. If the spirometer includes an indicator to show the highest number that you have reached, your health care provider or respiratory therapist will help you set a goal. Keep a log of your progress as told by your health care provider. What are the risks? Breathing too quickly may cause dizziness or cause you to  pass out. Take your time so you do not get dizzy or light-headed. If you are in pain, you may need to take pain medicine before doing incentive spirometry. It is harder to take a deep breath if you are having pain. How to use your incentive spirometer  Sit up on the edge of your bed or on a chair. Hold the incentive spirometer so that it is in an upright position. Before you use the spirometer, breathe out normally. Place the mouthpiece in your mouth. Make sure your lips are closed tightly around it. Breathe in slowly and as deeply as you can through your mouth, causing the piston or the ball to rise toward the top of the chamber. Hold your breath for 3-5 seconds, or for as long as possible. If the spirometer includes a coach indicator, use this to guide you in breathing. Slow down your breathing if the indicator goes above the marked areas. Remove the mouthpiece from your mouth and breathe out normally. The piston or ball will return to the bottom of the chamber. Rest for a few seconds, then repeat the steps 10 or more times. Take your time and take a few normal breaths between deep breaths so that you do not get dizzy or light-headed. Do this every 1-2  hours when you are awake. If the spirometer includes a goal marker to show the highest number you have reached (best effort), use this as a goal to work toward during each repetition. After each set of 10 deep breaths, cough a few times. This will help to make sure that your lungs are clear. If you have an incision on your chest or abdomen from surgery, place a pillow or a rolled-up towel firmly against the incision when you cough. This can help to reduce pain while taking deep breaths and coughing. General tips When you are able to get out of bed: Walk around often. Continue to take deep breaths and cough in order to clear your lungs. Keep using the incentive spirometer until your health care provider says it is okay to stop using it. If you have been in the hospital, you may be told to keep using the spirometer at home. Contact a health care provider if: You are having difficulty using the spirometer. You have trouble using the spirometer as often as instructed. Your pain medicine is not giving enough relief for you to use the spirometer as told. You have a fever. Get help right away if: You develop shortness of breath. You develop a cough with bloody mucus from the lungs. You have fluid or blood coming from an incision site after you cough. Summary An incentive spirometer is a tool that can help you learn to take long, deep breaths to keep your lungs clear and active. You may be asked to use a spirometer after a surgery, if you have a lung problem or a history of smoking, or if you have been inactive for a long period of time. Use your incentive spirometer as instructed every 1-2 hours while you are awake. If you have an incision on your chest or abdomen, place a pillow or a rolled-up towel firmly against your incision when you cough. This will help to reduce pain. Get help right away if you have shortness of breath, you cough up bloody mucus, or blood comes from your incision when you  cough. This information is not intended to replace advice given to you by your health care provider. Make sure you discuss any  questions you have with your health care provider. Document Revised: 08/19/2023 Document Reviewed: 08/19/2023 Elsevier Patient Education  2024 [employer]  General Anesthesia, Adult, Care After The following information offers guidance on how to care for yourself after your procedure. Your health care provider may also give you more specific instructions. If you have problems or questions, contact your health care provider. What can I expect after the procedure? After the procedure, it is common for people to: Have pain or discomfort at the IV site. Have nausea or vomiting. Have a sore throat or hoarseness. Have trouble concentrating. Feel cold or chills. Feel weak, sleepy, or tired (fatigue). Have soreness and body aches. These can affect parts of the body that were not involved in surgery. Follow these instructions at home: For the time period you were told by your health care provider:  Rest. Do not participate in activities where you could fall or become injured. Do not drive or use machinery. Do not drink alcohol. Do not take sleeping pills or medicines that cause drowsiness. Do not make important decisions or sign legal documents. Do not take care of children on your own. General instructions Drink enough fluid to keep your urine pale yellow. If you have sleep apnea, surgery and certain medicines can increase your risk for breathing problems. Follow instructions from your health care provider about wearing your sleep device: Anytime you are sleeping, including during daytime naps. While taking prescription pain medicines, sleeping medicines, or medicines that make you drowsy. Return to your normal activities as told by your health care provider. Ask your health care provider what activities are safe for you. Take over-the-counter and prescription  medicines only as told by your health care provider. Do not use any products that contain nicotine or tobacco. These products include cigarettes, chewing tobacco, and vaping devices, such as e-cigarettes. These can delay incision healing after surgery. If you need help quitting, ask your health care provider. Contact a health care provider if: You have nausea or vomiting that does not get better with medicine. You vomit every time you eat or drink. You have pain that does not get better with medicine. You cannot urinate or have bloody urine. You develop a skin rash. You have a fever. Get help right away if: You have trouble breathing. You have chest pain. You vomit blood. These symptoms may be an emergency. Get help right away. Call 911. Do not wait to see if the symptoms will go away. Do not drive yourself to the hospital. Summary After the procedure, it is common to have a sore throat, hoarseness, nausea, vomiting, or to feel weak, sleepy, or fatigue. For the time period you were told by your health care provider, do not drive or use machinery. Get help right away if you have difficulty breathing, have chest pain, or vomit blood. These symptoms may be an emergency. This information is not intended to replace advice given to you by your health care provider. Make sure you discuss any questions you have with your health care provider. Document Revised: 01/08/2022 Document Reviewed: 01/08/2022 Elsevier Patient Education  2024 Elsevier Inc.  Surgery to See or Treat a Knee Problem (Knee Arthroscopy): What to Expect Knee arthroscopy is a surgery to check the inside of the knee and repair any damaged cartilage, ligaments, and other soft tissues. You may have this surgery if other treatments have not relieved your symptoms. Knee arthroscopy may be used to: Diagnose an injury to soft tissue. Repair a torn ligament or  other torn tissue. Take out bone fragments. Treat kneecap problems. Treat a  very bad infection of the knee. This surgery is done using a thin tube that has a light and camera (arthroscope). The arthroscope is placed through a small cut in the knee. The camera sends images to a screen in the operating room. The images are used to help do the surgery. Tell a health care provider about: Any allergies you have. All medicines you take. These also includes vitamins, herbs, eye drops and creams you use. Any problems you or family members have had with medicines that make you fall asleep for surgery. Any bleeding problems you have. Any surgeries you have had. Any medical problems you have. Whether you're pregnant or may be pregnant. What are the risks? Your health care provider will talk with you about risks. These may include: Infection. Bleeding. Allergies to medicines. Damage to nerves and other parts of the knee. Blood clots. Failure of the surgery to treat your symptoms. Stiffness of the knee. What happens before the procedure? When to stop eating and drinking Eat and drink only as you've been told. You may be told this: 8 hours before your surgery Stop eating most foods. Do not eat meat, fried foods, or fatty foods. Eat only light foods, such as toast or crackers. All liquids are OK except energy drinks and alcohol. 6 hours before your surgery Stop eating. Drink only clear liquids, such as water, clear fruit juice, black coffee, plain tea, and sports drinks. Do not drink energy drinks or alcohol. 2 hours before your procedure Stop drinking all liquids. You may be allowed to take medicines with small sips of water. If you do not eat and drink as told, your surgery may be delayed or canceled. Medicines Ask about changing or stopping: Any medicines you take. Any vitamins, herbs, or supplements you take. Do not take aspirin  or ibuprofen unless you're told to. Surgery safety For your safety, you may: Need to wash your skin with a soap that kills  germs. Have your surgery site marked. Get antibiotics. Have hair removed at the surgery site. General instructions Do not smoke, vape, or use nicotine or tobacco for at least 4 weeks before the surgery. Plan to have a responsible adult: Drive you home from the hospital or clinic. You won't be allowed to drive. Stay with you for the time you're told. What happens during the procedure?  An IV will be put into a vein in your hand or arm. You may be given: A sedative to help you relax. Anesthesia to keep you from feeling pain. A cuff may be placed around your upper leg. This will slow blood flow to your lower leg during the procedure. Several small cuts will be made around your knee. A salt-water (saline) solution will be put into one of the cuts. This expands the knee joint and clears away any blood. This will let the surgeon see your knee more clearly. An arthroscope will be passed into your knee through one of the cuts. Other instruments will be passed through the other cuts. Your surgeon will check and repair your knee as needed. Your cuts will be closed with adhesive tape, stitches, or skin glue. The area will be covered with a bandage and a brace. These steps may vary. Ask what you can expect. What happens after the procedure? You will be watched closely until you leave. This includes checking your pain level, blood pressure, heart rate, and breathing rate. You will be  given medicines for pain. Where to find more information American Academy of Orthopaedic [employer]geons: orthoinfo.aaos.org This information is not intended to replace advice given to you by your health care provider. Make sure you discuss any questions you have with your health care provider. Document Revised: 04/11/2023 Document Reviewed: 04/11/2023 Elsevier Patient Education  2024 [employer].

## 2024-04-19 ENCOUNTER — Other Ambulatory Visit: Payer: Self-pay

## 2024-04-19 ENCOUNTER — Encounter (HOSPITAL_COMMUNITY): Payer: Self-pay

## 2024-04-19 ENCOUNTER — Encounter (HOSPITAL_COMMUNITY)
Admission: RE | Admit: 2024-04-19 | Discharge: 2024-04-19 | Disposition: A | Discharge status: Home / Self Care | Source: Ambulatory Visit | Attending: Orthopedic Surgery | Admitting: Orthopedic Surgery

## 2024-04-19 DIAGNOSIS — M25861 Other specified joint disorders, right knee: Secondary | ICD-10-CM | POA: Diagnosis not present

## 2024-04-19 DIAGNOSIS — Z01812 Encounter for preprocedural laboratory examination: Secondary | ICD-10-CM | POA: Diagnosis not present

## 2024-04-19 DIAGNOSIS — Z01818 Encounter for other preprocedural examination: Secondary | ICD-10-CM

## 2024-04-19 LAB — BASIC METABOLIC PANEL WITH GFR
Anion gap: 9 (ref 5–15)
BUN: 16 mg/dL (ref 8–23)
CO2: 26 mmol/L (ref 22–32)
Calcium: 9.3 mg/dL (ref 8.9–10.3)
Chloride: 105 mmol/L (ref 98–111)
Creatinine, Ser: 0.62 mg/dL (ref 0.44–1.00)
GFR, Estimated: >60 mL/min (ref >60)
Glucose, Bld: 103 mg/dL — ABNORMAL HIGH (ref 70–99)
Potassium: 4.5 mmol/L (ref 3.5–5.1)
Sodium: 140 mmol/L (ref 135–145)

## 2024-04-19 LAB — CBC WITH DIFFERENTIAL/PLATELET
Abs Immature Granulocytes: 0.02 10*3/uL (ref 0.00–0.07)
Basophils Absolute: 0 10*3/uL (ref 0.0–0.1)
Basophils Relative: 0 %
Eosinophils Absolute: 0.1 10*3/uL (ref 0.0–0.5)
Eosinophils Relative: 3 %
HCT: 35.7 % — ABNORMAL LOW (ref 36.0–46.0)
Hemoglobin: 11.4 g/dL — ABNORMAL LOW (ref 12.0–15.0)
Immature Granulocytes: 0 %
Lymphocytes Relative: 38 %
Lymphs Abs: 1.8 10*3/uL (ref 0.7–4.0)
MCH: 27.1 pg (ref 26.0–34.0)
MCHC: 31.9 g/dL (ref 30.0–36.0)
MCV: 85 fL (ref 80.0–100.0)
Monocytes Absolute: 0.4 10*3/uL (ref 0.1–1.0)
Monocytes Relative: 9 %
Neutro Abs: 2.3 10*3/uL (ref 1.7–7.7)
Neutrophils Relative %: 50 %
Platelets: 246 10*3/uL (ref 150–400)
RBC: 4.2 MIL/uL (ref 3.87–5.11)
RDW: 14.7 % (ref 11.5–15.5)
WBC: 4.7 10*3/uL (ref 4.0–10.5)
nRBC: 0 % (ref 0.0–0.2)

## 2024-04-23 NOTE — H&P (Signed)
 Kelly Fox is an 73 y.o. female.   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  Past Medical History:  Diagnosis Date   Asthma     Past Surgical History:  Procedure Laterality Date   DENTAL SURGERY     KNEE ARTHROSCOPY Left 10/29/2022   Procedure: ARTHROSCOPY KNEE LEFT FOR SCAR TISSUE DEBRIDEMENT;  Surgeon: Margrette Taft BRAVO, MD;  Location: [facility];  Service: Orthopedics;  Laterality: Left;   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/02/2022   Procedure: TOTAL KNEE ARTHROPLASTY;  Surgeon: Margrette Taft BRAVO, MD;  Location: [facility];  Service: Orthopedics;  Laterality: Left;   TOTAL KNEE ARTHROPLASTY Right 04/19/2023   Procedure: TOTAL KNEE ARTHROPLASTY;  Surgeon: Margrette Taft BRAVO, MD;  Location: [facility];  Service: Orthopedics;  Laterality: Right;    Family History  Problem Relation Age of Onset   Breast cancer Sister    Social History:  reports that she has never smoked. She has never used smokeless tobacco. She reports that she does not currently use alcohol. She reports that she does not currently use drugs.  Allergies:  Allergies  Allergen Reactions   Contrast Media [Iodinated Contrast Media] Swelling and Rash    No medications prior to admission.    No results found for this or any previous visit (from the past 48 hours). No results found.  Review of Systems negative except for generalized muscle aches some thigh pain and some right leg pain  There were no vitals taken for this visit. Physical Exam  General Appearance normal development grooming and  hygiene  Oriented x 3  Mood and affect normal  Gait and station shows -8 antalgic gait there may be some slight leg length discrepancy causing a waddling type gait  Inspection of the right knee shows a normal incision over the right knee well-healed no erythema no effusion is noted there is tenderness in the superior patellar area at the quadriceps insertion.  The total arc of motion is approximately 118 degrees the knee feels stable in flexion and extension the strength is normal and there is some pain with patellofemoral resistance and a clicking sensation from mid flexion through extension.  Pulse and perfusion normal distally no lymphadenopathy normal sensation and reflexes and normal balance and coordination are noted  Imaging studies of the right total knee show no malalignment issues and no loosening  Lab studies have been negative     Latest Ref Rng & Units 04/19/2024   10:56 AM 04/20/2023    4:25 AM 04/13/2023    1:24 PM  CBC  WBC 4.0 - 10.5 K/uL 4.7  7.2  4.2   Hemoglobin 12.0 - 15.0 g/dL 88.5  8.5  88.6   Hematocrit 36.0 - 46.0 % 35.7  27.5  36.5   Platelets 150 - 400 K/uL 246  158  212        Latest Ref Rng & Units 04/19/2024   10:56 AM 04/20/2023    4:25 AM 04/13/2023    1:24 PM  BMP  Glucose 70 - 99 mg/dL 896  870  899   BUN 8 - 23 mg/dL 16  14  15   Creatinine 0.44 - 1.00 mg/dL 9.37  9.26  9.32   Sodium 135 - 145 mmol/L 140  134  137   Potassium 3.5 - 5.1 mmol/L 4.5  4.5  3.9   Chloride 98 - 111 mmol/L 105  104  103   CO2 22 - 32 mmol/L 26  25  27    Calcium 8.9 - 10.3 mg/dL 9.3  8.5  9.0      Assessment/Plan Patellar clunk syndrome right knee  Arthroscopy and debridement right knee  Taft Minerva, MD 04/23/2024, 12:22 PM

## 2024-04-24 ENCOUNTER — Ambulatory Visit (HOSPITAL_COMMUNITY)
Admission: RE | Admit: 2024-04-24 | Discharge: 2024-04-24 | Disposition: A | Discharge status: Home / Self Care | Source: Ambulatory Visit | Attending: Orthopedic Surgery | Admitting: Orthopedic Surgery

## 2024-04-24 ENCOUNTER — Ambulatory Visit (HOSPITAL_COMMUNITY): Payer: Self-pay | Admitting: Certified Registered Nurse Anesthetist

## 2024-04-24 ENCOUNTER — Encounter (HOSPITAL_COMMUNITY)
Admission: RE | Disposition: A | Discharge status: Home / Self Care | Payer: Self-pay | Source: Ambulatory Visit | Attending: Orthopedic Surgery

## 2024-04-24 DIAGNOSIS — Z96653 Presence of artificial knee joint, bilateral: Secondary | ICD-10-CM | POA: Insufficient documentation

## 2024-04-24 DIAGNOSIS — J45909 Unspecified asthma, uncomplicated: Secondary | ICD-10-CM | POA: Diagnosis not present

## 2024-04-24 DIAGNOSIS — M25861 Other specified joint disorders, right knee: Secondary | ICD-10-CM

## 2024-04-24 HISTORY — PX: KNEE ARTHROSCOPY: SHX127

## 2024-04-24 SURGERY — ARTHROSCOPY, KNEE
Anesthesia: General | Site: Knee | Laterality: Right

## 2024-04-24 MED ORDER — DEXAMETHASONE SODIUM PHOSPHATE 10 MG/ML IJ SOLN
INTRAMUSCULAR | Status: DC | PRN
Start: 2024-04-24 — End: 2024-04-24
  Administered 2024-04-24: 10 mg via INTRAVENOUS

## 2024-04-24 MED ORDER — DEXAMETHASONE SODIUM PHOSPHATE 10 MG/ML IJ SOLN
INTRAMUSCULAR | Status: AC
  Filled 2024-04-24: qty 1

## 2024-04-24 MED ORDER — FENTANYL CITRATE (PF) 250 MCG/5ML IJ SOLN
INTRAMUSCULAR | Status: DC | PRN
  Administered 2024-04-24: 50 ug via INTRAVENOUS

## 2024-04-24 MED ORDER — PHENYLEPHRINE 80 MCG/ML (10ML) SYRINGE FOR IV PUSH (FOR BLOOD PRESSURE SUPPORT)
PREFILLED_SYRINGE | INTRAVENOUS | Status: DC | PRN
  Administered 2024-04-24: 80 ug via INTRAVENOUS
  Administered 2024-04-24: 160 ug via INTRAVENOUS
  Administered 2024-04-24: 80 ug via INTRAVENOUS

## 2024-04-24 MED ORDER — SODIUM CHLORIDE 0.9 % IR SOLN
Status: DC | PRN
  Administered 2024-04-24 (×3): 3000 mL

## 2024-04-24 MED ORDER — BUPIVACAINE-EPINEPHRINE (PF) 0.5% -1:200000 IJ SOLN
INTRAMUSCULAR | Status: DC | PRN
  Administered 2024-04-24: 30 mL via PERINEURAL

## 2024-04-24 MED ORDER — ONDANSETRON HCL 4 MG/2ML IJ SOLN
INTRAMUSCULAR | Status: AC
  Filled 2024-04-24: qty 2

## 2024-04-24 MED ORDER — CEFAZOLIN SODIUM-DEXTROSE 2-4 GM/100ML-% IV SOLN
2.0000 g | INTRAVENOUS | Status: AC
  Administered 2024-04-24: 2 g via INTRAVENOUS

## 2024-04-24 MED ORDER — HYDROCODONE-ACETAMINOPHEN 10-325 MG PO TABS: 1.0000 | ORAL_TABLET | ORAL | 0 refills | Status: AC | PRN

## 2024-04-24 MED ORDER — DEXMEDETOMIDINE HCL IN NACL 80 MCG/20ML IV SOLN
INTRAVENOUS | Status: DC | PRN
  Administered 2024-04-24: 12 ug via INTRAVENOUS

## 2024-04-24 MED ORDER — CHLORHEXIDINE GLUCONATE 0.12 % MT SOLN
15.0000 mL | Freq: Once | OROMUCOSAL | Status: AC
  Administered 2024-04-24: 15 mL via OROMUCOSAL

## 2024-04-24 MED ORDER — ONDANSETRON HCL 4 MG/2ML IJ SOLN
INTRAMUSCULAR | Status: DC | PRN
  Administered 2024-04-24: 4 mg via INTRAVENOUS

## 2024-04-24 MED ORDER — PHENYLEPHRINE 80 MCG/ML (10ML) SYRINGE FOR IV PUSH (FOR BLOOD PRESSURE SUPPORT)
PREFILLED_SYRINGE | INTRAVENOUS | Status: AC
  Filled 2024-04-24: qty 10

## 2024-04-24 MED ORDER — LACTATED RINGERS IV SOLN: INTRAVENOUS | Status: DC

## 2024-04-24 MED ORDER — ORAL CARE MOUTH RINSE: 15.0000 mL | Freq: Once | OROMUCOSAL | Status: AC

## 2024-04-24 MED ORDER — CEFAZOLIN SODIUM-DEXTROSE 2-4 GM/100ML-% IV SOLN
INTRAVENOUS | Status: AC
  Filled 2024-04-24: qty 100

## 2024-04-24 MED ORDER — PROPOFOL 500 MG/50ML IV EMUL
INTRAVENOUS | Status: DC | PRN
  Administered 2024-04-24: 200 mg via INTRAVENOUS

## 2024-04-24 MED ORDER — PROPOFOL 10 MG/ML IV BOLUS
INTRAVENOUS | Status: AC
  Filled 2024-04-24: qty 20

## 2024-04-24 MED ORDER — LIDOCAINE 2% (20 MG/ML) 5 ML SYRINGE
INTRAMUSCULAR | Status: DC | PRN
  Administered 2024-04-24: 80 mg via INTRAVENOUS

## 2024-04-24 MED ORDER — FENTANYL CITRATE (PF) 250 MCG/5ML IJ SOLN
INTRAMUSCULAR | Status: AC
  Filled 2024-04-24: qty 5

## 2024-04-24 MED ORDER — EPINEPHRINE PF 1 MG/ML IJ SOLN
INTRAMUSCULAR | Status: AC
  Filled 2024-04-24: qty 8

## 2024-04-24 MED ORDER — BUPIVACAINE-EPINEPHRINE (PF) 0.5% -1:200000 IJ SOLN
INTRAMUSCULAR | Status: AC
  Filled 2024-04-24: qty 30

## 2024-04-24 MED ORDER — SODIUM CHLORIDE 0.9 % IR SOLN
Status: DC | PRN
  Administered 2024-04-24: 1000 mL

## 2024-04-24 SURGICAL SUPPLY — 39 items
BNDG ELASTIC 6INX 5YD STR LF (GAUZE/BANDAGES/DRESSINGS) IMPLANT
CANNULA DRILOCK 5.0X75 (CANNULA) IMPLANT
CHLORAPREP W/TINT 26 (MISCELLANEOUS) ×1 IMPLANT
CLOTH BEACON ORANGE TIMEOUT ST (SAFETY) ×1 IMPLANT
COUNTER NDL MAGNETIC 40 RED (SET/KITS/TRAYS/PACK) ×1 IMPLANT
COUNTER NEEDLE MAGNETIC 40 RED (SET/KITS/TRAYS/PACK) ×1 IMPLANT
CUFF TRNQT CYL 34X4.125X (TOURNIQUET CUFF) ×1 IMPLANT
DECANTER SPIKE VIAL GLASS SM (MISCELLANEOUS) ×2 IMPLANT
DISSECTOR 4.0MM X 13CM (MISCELLANEOUS) IMPLANT
GAUZE SPONGE 4X4 12PLY STRL (GAUZE/BANDAGES/DRESSINGS) IMPLANT
GAUZE XEROFORM 5X9 LF (GAUZE/BANDAGES/DRESSINGS) ×1 IMPLANT
GLOVE BIOGEL PI IND STRL 7.0 (GLOVE) ×2 IMPLANT
GLOVE BIOGEL PI IND STRL 8.5 (GLOVE) ×1 IMPLANT
GLOVE SKINSENSE STRL SZ8.0 LF (GLOVE) ×1 IMPLANT
GOWN STRL REUS W/TWL LRG LVL3 (GOWN DISPOSABLE) ×1 IMPLANT
GOWN STRL REUS W/TWL XL LVL3 (GOWN DISPOSABLE) ×1 IMPLANT
KIT TURNOVER CYSTO (KITS) ×1 IMPLANT
MANIFOLD NEPTUNE II (INSTRUMENTS) ×1 IMPLANT
MARKER SKIN DUAL TIP RULER LAB (MISCELLANEOUS) ×1 IMPLANT
NDL HYPO 18GX1.5 BLUNT FILL (NEEDLE) ×1 IMPLANT
NDL HYPO 21X1.5 SAFETY (NEEDLE) ×1 IMPLANT
NEEDLE HYPO 18GX1.5 BLUNT FILL (NEEDLE) ×1 IMPLANT
NEEDLE HYPO 21X1.5 SAFETY (NEEDLE) ×1 IMPLANT
NS IRRIG 1000ML POUR BTL (IV SOLUTION) ×1 IMPLANT
PACK ARTHRO LIMB DRAPE STRL (MISCELLANEOUS) ×1 IMPLANT
PAD ABD 5X9 TENDERSORB (GAUZE/BANDAGES/DRESSINGS) ×1 IMPLANT
PAD ARMBOARD POSITIONER FOAM (MISCELLANEOUS) ×1 IMPLANT
PAD CAST 4YDX4 CTTN HI CHSV (CAST SUPPLIES) IMPLANT
PAD COLD SHLDR SM WRAP-ON (PAD) IMPLANT
PADDING CAST COTTON 6X4 STRL (CAST SUPPLIES) ×1 IMPLANT
PORT APPOLLO RF 90DEGREE MULTI (SURGICAL WAND) IMPLANT
POSITIONER HEAD 8X9X4 ADT (SOFTGOODS) ×1 IMPLANT
SET ARTHROSCOPY INST (INSTRUMENTS) ×1 IMPLANT
SET BASIN LINEN APH (SET/KITS/TRAYS/PACK) ×1 IMPLANT
SOL .9 NS 3000ML IRR UROMATIC (IV SOLUTION) ×2 IMPLANT
SUT 3-0 BLK 1X30 PSL (SUTURE) ×1 IMPLANT
SYR 10ML LL (SYRINGE) ×1 IMPLANT
TUBE CONNECTING 12X1/4 (SUCTIONS) ×1 IMPLANT
TUBING IN/OUT FLOW W/MAIN PUMP (TUBING) ×1 IMPLANT

## 2024-04-24 NOTE — Transfer of Care (Signed)
 Immediate Anesthesia Transfer of Care Note  Patient: Kelly Fox  Procedure(s) Performed: ARTHROSCOPY, KNEE for scar tissue debridement (Right: Knee)  Patient Location: PACU  Anesthesia Type:General  Level of Consciousness: awake, alert , and oriented  Airway & Oxygen Therapy: Patient Spontanous Breathing  Post-op Assessment: Report given to RN and Post -op Vital signs reviewed and stable  Post vital signs: Reviewed and stable  Last Vitals:  Vitals Value Taken Time  BP 120/80   Temp 98   Pulse 83 04/24/24 12:55  Resp 11 04/24/24 12:55  SpO2 91 % 04/24/24 12:55  Vitals shown include unfiled device data.  Last Pain:  Vitals:   04/24/24 1044  TempSrc: Oral  PainSc: 2       Patients Stated Pain Goal: 6 (04/24/24 1044)  Complications: No notable events documented.

## 2024-04-24 NOTE — Interval H&P Note (Signed)
 History and Physical Interval Note:  04/24/2024 11:30 AM  Kelly Fox  has presented today for surgery, with the diagnosis of patellar clunk syndrome right knee.  The various methods of treatment have been discussed with the patient and family. After consideration of risks, benefits and other options for treatment, the patient has consented to  Procedure(s): ARTHROSCOPY, KNEE for scar tissue debridement (Right) as a surgical intervention.  The patient's history has been reviewed, patient examined, no change in status, stable for surgery.  I have reviewed the patient's chart and labs.  Questions were answered to the patient's satisfaction.     Kelly Fox

## 2024-04-24 NOTE — Anesthesia Postprocedure Evaluation (Signed)
 Anesthesia Post Note  Patient: Kelly Fox  Procedure(s) Performed: ARTHROSCOPY, KNEE for scar tissue debridement (Right: Knee)  Patient location during evaluation: PACU Anesthesia Type: General Level of consciousness: awake and alert Pain management: pain level controlled Vital Signs Assessment: post-procedure vital signs reviewed and stable Respiratory status: spontaneous breathing, nonlabored ventilation, respiratory function stable and patient connected to nasal cannula oxygen Cardiovascular status: blood pressure returned to baseline and stable Postop Assessment: no apparent nausea or vomiting Anesthetic complications: no  No notable events documented.   Last Vitals:  Vitals:   04/24/24 1315 04/24/24 1330  BP: 122/60   Pulse: 68 65  Resp: 17 17  Temp:    SpO2: 97% 100%    Last Pain:  Vitals:   04/24/24 1315  TempSrc:   PainSc: 0-No pain                 Andrea Limes

## 2024-04-24 NOTE — Op Note (Signed)
 04/24/2024  12:55 PM  PATIENT:  Kelly Fox  73 y.o. female  PRE-OPERATIVE DIAGNOSIS:  patellar clunk syndrome right knee  POST-OPERATIVE DIAGNOSIS:  patellar clunk syndrome right knee  PROCEDURE:  Procedure(s): ARTHROSCOPY, KNEE for scar tissue debridement (Right)   Findings  Scar tissue at the patella quadriceps tendon junction Moderate amount  No other abnormalities  Procedure was done as follows  The patient was seen in the preop area cleared for surgery.  Per right knee was confirmed as the surgical site and marked chart review was completed including imaging  The patient was taken the operating for general anesthesia she was placed in the supine position  No leg holder was used a tourniquet was applied but not inflated  The patient was prepped and draped sterilely and then timeout was completed  We made a lateral standard inferolateral patellar portal and placed the scope into the suprapatellar pouch and then used a spinal needle to make a medial portal  We used an arthroscopic wand 90 degree to remove the scar tissue which was seen at the junction of the patella and quadriceps tendon and then extended down the medial side of the joint in the position of a plica  We scoped the rest of the joint found no other abnormalities We continue debridement with a shaver and wand until the tissue was removed  We used an accessory medial portal inferiorly  To remove the plica  The joint was irrigated all bleeders were coagulated  3 incisions were closed with 3-0 nylon suture in the joint was injected with Marcaine  with epinephrine  after suctioning dry  Sterile dressing was applied  Cryo/Cuff was applied  The patient was taken to recovery room in stable condition   Postop plan Weight-bear as tolerated dressing can be changed in 3 days Follow-up in a week Range of motion exercises start in 3 days as well     SURGEON:  Surgeons and Role:    * Margrette Taft BRAVO,  MD - Primary  PHYSICIAN ASSISTANT:   ASSISTANTS: none   ANESTHESIA:   general  EBL:  minimal  BLOOD ADMINISTERED:none  DRAINS: none   LOCAL MEDICATIONS USED:  MARCAINE      SPECIMEN:  No Specimen  DISPOSITION OF SPECIMEN:  N/A  COUNTS:  YES  TOURNIQUET:  * Missing tourniquet times found for documented tourniquets in log: [id number] *  DICTATION: .Dragon Dictation  PLAN OF CARE: Discharge to home after PACU  PATIENT DISPOSITION:  PACU - hemodynamically stable.   Delay start of Pharmacological VTE agent (>24hrs) due to surgical blood loss or risk of bleeding: not applicable

## 2024-04-24 NOTE — Anesthesia Preprocedure Evaluation (Addendum)
 Anesthesia Evaluation  Patient identified by MRN, date of birth, ID band Patient awake    Reviewed: Allergy & Precautions, H&P , NPO status , Patient's Chart, lab work & pertinent test results  Airway Mallampati: II  TM Distance: >3 FB Neck ROM: Full    Dental no notable dental hx.    Pulmonary asthma    Pulmonary exam normal breath sounds clear to auscultation       Cardiovascular negative cardio ROS Normal cardiovascular exam Rhythm:Regular Rate:Normal     Neuro/Psych negative neurological ROS  negative psych ROS   GI/Hepatic negative GI ROS, Neg liver ROS,,,  Endo/Other  negative endocrine ROS    Renal/GU negative Renal ROS  negative genitourinary   Musculoskeletal  (+) Arthritis ,    Abdominal   Peds negative pediatric ROS (+)  Hematology negative hematology ROS (+)   Anesthesia Other Findings   Reproductive/Obstetrics negative OB ROS                             Anesthesia Physical Anesthesia Plan  ASA: 2  Anesthesia Plan: General   Post-op Pain Management:    Induction: Intravenous  PONV Risk Score and Plan:   Airway Management Planned: Oral ETT  Additional Equipment:   Intra-op Plan:   Post-operative Plan:   Informed Consent: I have reviewed the patients History and Physical, chart, labs and discussed the procedure including the risks, benefits and alternatives for the proposed anesthesia with the patient or authorized representative who has indicated his/her understanding and acceptance.       Plan Discussed with: CRNA  Anesthesia Plan Comments:        Anesthesia Quick Evaluation

## 2024-04-24 NOTE — Anesthesia Procedure Notes (Addendum)
 Procedure Name: LMA Insertion Date/Time: 04/24/2024 11:49 AM  Performed by: Zelphia Norleen HERO, CRNAPre-anesthesia Checklist: Patient identified, Emergency Drugs available, Suction available and Patient being monitored Patient Re-evaluated:Patient Re-evaluated prior to induction Oxygen Delivery Method: Circle system utilized Preoxygenation: Pre-oxygenation with 100% oxygen Induction Type: IV induction Ventilation: Mask ventilation without difficulty LMA: LMA inserted LMA Size: 4.0 Number of attempts: 1 Placement Confirmation: positive ETCO2 Tube secured with: Tape Dental Injury: Teeth and Oropharynx as per pre-operative assessment  Comments: Upper and lower dentures removed in preop.

## 2024-04-24 NOTE — Brief Op Note (Signed)
 04/24/2024  12:55 PM  PATIENT:  Kelly Fox  73 y.o. female  PRE-OPERATIVE DIAGNOSIS:  patellar clunk syndrome right knee  POST-OPERATIVE DIAGNOSIS:  patellar clunk syndrome right knee  PROCEDURE:  Procedure(s): ARTHROSCOPY, KNEE for scar tissue debridement (Right)   Findings  Scar tissue at the patella quadriceps tendon junction Moderate amount  No other abnormalities  Procedure was done as follows  The patient was seen in the preop area cleared for surgery.  Per right knee was confirmed as the surgical site and marked chart review was completed including imaging  The patient was taken the operating for general anesthesia she was placed in the supine position  No leg holder was used a tourniquet was applied but not inflated  The patient was prepped and draped sterilely and then timeout was completed  We made a lateral standard inferolateral patellar portal and placed the scope into the suprapatellar pouch and then used a spinal needle to make a medial portal  We used an arthroscopic wand 90 degree to remove the scar tissue which was seen at the junction of the patella and quadriceps tendon and then extended down the medial side of the joint in the position of a plica  We scoped the rest of the joint found no other abnormalities We continue debridement with a shaver and wand until the tissue was removed  We used an accessory medial portal inferiorly  To remove the plica  The joint was irrigated all bleeders were coagulated  3 incisions were closed with 3-0 nylon suture in the joint was injected with Marcaine  with epinephrine  after suctioning dry  Sterile dressing was applied  Cryo/Cuff was applied  The patient was taken to recovery room in stable condition   Postop plan Weight-bear as tolerated dressing can be changed in 3 days Follow-up in a week Range of motion exercises start in 3 days as well     SURGEON:  Surgeons and Role:    * Margrette Taft BRAVO,  MD - Primary  PHYSICIAN ASSISTANT:   ASSISTANTS: none   ANESTHESIA:   general  EBL:  minimal  BLOOD ADMINISTERED:none  DRAINS: none   LOCAL MEDICATIONS USED:  MARCAINE      SPECIMEN:  No Specimen  DISPOSITION OF SPECIMEN:  N/A  COUNTS:  YES  TOURNIQUET:  * Missing tourniquet times found for documented tourniquets in log: [id number] *  DICTATION: .Dragon Dictation  PLAN OF CARE: Discharge to home after PACU  PATIENT DISPOSITION:  PACU - hemodynamically stable.   Delay start of Pharmacological VTE agent (>24hrs) due to surgical blood loss or risk of bleeding: not applicable

## 2024-04-25 ENCOUNTER — Encounter (HOSPITAL_COMMUNITY): Payer: Self-pay | Admitting: Orthopedic Surgery

## 2024-05-02 ENCOUNTER — Ambulatory Visit (INDEPENDENT_AMBULATORY_CARE_PROVIDER_SITE_OTHER): Admitting: Orthopedic Surgery

## 2024-05-02 DIAGNOSIS — M25861 Other specified joint disorders, right knee: Secondary | ICD-10-CM

## 2024-05-02 NOTE — Progress Notes (Signed)
   There were no vitals taken for this visit.  There is no height or weight on file to calculate BMI.  Chief Complaint  Patient presents with   Post-op Follow-up    Encounter Diagnosis  Name Primary?   Patellar clunk syndrome of right knee s/p arthroscopy 04/24/24 Yes    DOI/DOS/ Date:    Improved

## 2024-05-02 NOTE — Progress Notes (Signed)
 First postop visit  There were no vitals taken for this visit.  There is no height or weight on file to calculate BMI.  Chief Complaint  Patient presents with   Post-op Follow-up    Encounter Diagnosis  Name Primary?   Patellar clunk syndrome of right knee s/p arthroscopy 04/24/24 Yes    DOI/DOS/ Date:    Improved  Sutures were removed other than some bruising around the medial side of the joint everything looks good return in 4 weeks for final check

## 2024-05-08 ENCOUNTER — Other Ambulatory Visit: Payer: Self-pay | Admitting: Orthopedic Surgery

## 2024-05-16 ENCOUNTER — Encounter (HOSPITAL_COMMUNITY)
Admission: RE | Admit: 2024-05-16 | Discharge: 2024-05-16 | Disposition: A | Discharge status: Home / Self Care | Source: Ambulatory Visit | Attending: Optometry | Admitting: Optometry

## 2024-05-16 DIAGNOSIS — H2512 Age-related nuclear cataract, left eye: Secondary | ICD-10-CM | POA: Diagnosis not present

## 2024-05-16 NOTE — H&P (Signed)
 Surgical History & Physical  Patient Name: Kelly Fox  DOB: 11/19/1950  Surgery: Cataract extraction with intraocular lens implant phacoemulsification; Left Eye Surgeon: Marsa Cleverly MD Surgery Date: 05/18/2024 Pre-Op Date: 04/10/2024  HPI: A 78 Yr. old female patient here today for an initial medical exam to have a cataract evaluation. Pt states vision has decreased in OU, more noticeably in OS. Pt is having trouble with near vision up close, she has great difficulty trying to do cross stitching lately, along with difficulty reading small print on books, bottles, and labels. Pt also has difficulty seeing captions on the TV, recognizing people at a distance and poor night vision. Pt complains of overall hazy, blurry vision and would like to proceed with cataract surgery to improve vision. HPI was performed by Marsa Cleverly .  Medical History: Cataracts  Arthritis  Review of Systems Allergic/Immunologic Seasonal Allergies All recorded systems are negative except as noted above.  Social Never smoked   Medication Prednisolone-moxiflox-bromfen,  Tramadol , Gabapentin   Sx/Procedures Knee replacement x2  Drug Allergies  contrast dye   History & Physical: Heent: cataracts NECK: supple without bruits LUNGS: lungs clear to auscultation CV: regular rate and rhythm Abdomen: soft and non-tender  Impression & Plan: Assessment: 1.  CATARACT NUCLEAR SCLEROSIS AGE RELATED; Both Eyes (H25.13) 2.  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  Cataracts are visually significant and account for the patient's complaints. Discussed all risks, benefits, procedures and recovery, including infection, loss of vision and eye, need for glasses after surgery or additional procedures. Patient understands changing glasses will not improve vision. Patient indicated understanding of procedure. All questions answered. Patient  desires to have surgery, recommend phacoemulsification with intraocular lens. Patient to have preliminary testing necessary (Argos/IOL Master, Mac OCT, TOPO) Educational materials provided:Cataract.  Plan: - Proceed with cataract surgery OS, followed by OD - Plan for best distance target with DIB00 - No DM, no fuchs, no prior eye surgery - good dilation - Dextenza  if available  2.  Mild/moderate and mostly in periphery. Monitor for now  3.  Explained the presence of lattice degeneration. Signs and symptoms of RD reviewed in detail Patient advised to call ASAP should vision decrease or symptoms increase

## 2024-05-18 ENCOUNTER — Encounter (HOSPITAL_COMMUNITY)
Admission: RE | Disposition: A | Discharge status: Home / Self Care | Payer: Self-pay | Source: Ambulatory Visit | Attending: Optometry

## 2024-05-18 ENCOUNTER — Ambulatory Visit (HOSPITAL_COMMUNITY): Admitting: Anesthesiology

## 2024-05-18 ENCOUNTER — Other Ambulatory Visit: Payer: Self-pay

## 2024-05-18 ENCOUNTER — Encounter (HOSPITAL_COMMUNITY): Admitting: Anesthesiology

## 2024-05-18 ENCOUNTER — Encounter (HOSPITAL_COMMUNITY): Payer: Self-pay | Admitting: Optometry

## 2024-05-18 ENCOUNTER — Ambulatory Visit (HOSPITAL_COMMUNITY)
Admission: RE | Admit: 2024-05-18 | Discharge: 2024-05-18 | Disposition: A | Discharge status: Home / Self Care | Source: Ambulatory Visit | Attending: Optometry | Admitting: Optometry

## 2024-05-18 DIAGNOSIS — H18593 Other hereditary corneal dystrophies, bilateral: Secondary | ICD-10-CM | POA: Insufficient documentation

## 2024-05-18 DIAGNOSIS — H2512 Age-related nuclear cataract, left eye: Secondary | ICD-10-CM | POA: Diagnosis not present

## 2024-05-18 DIAGNOSIS — H35411 Lattice degeneration of retina, right eye: Secondary | ICD-10-CM | POA: Insufficient documentation

## 2024-05-18 DIAGNOSIS — H5712 Ocular pain, left eye: Secondary | ICD-10-CM | POA: Insufficient documentation

## 2024-05-18 DIAGNOSIS — H2513 Age-related nuclear cataract, bilateral: Secondary | ICD-10-CM | POA: Diagnosis not present

## 2024-05-18 DIAGNOSIS — I10 Essential (primary) hypertension: Secondary | ICD-10-CM | POA: Diagnosis not present

## 2024-05-18 DIAGNOSIS — J45909 Unspecified asthma, uncomplicated: Secondary | ICD-10-CM | POA: Diagnosis not present

## 2024-05-18 HISTORY — PX: INSERTION, STENT, DRUG-ELUTING, LACRIMAL CANALICULUS: SHX7453

## 2024-05-18 HISTORY — PX: CATARACT EXTRACTION W/PHACO: SHX586

## 2024-05-18 SURGERY — PHACOEMULSIFICATION, CATARACT, WITH IOL INSERTION
Anesthesia: Monitor Anesthesia Care | Site: Eye | Laterality: Left

## 2024-05-18 MED ORDER — LACTATED RINGERS IV SOLN: INTRAVENOUS | Status: DC

## 2024-05-18 MED ORDER — STERILE WATER FOR IRRIGATION IR SOLN
Status: DC | PRN
  Administered 2024-05-18: 1

## 2024-05-18 MED ORDER — POVIDONE-IODINE 5 % OP SOLN
OPHTHALMIC | Status: DC | PRN
  Administered 2024-05-18: 1 via OPHTHALMIC

## 2024-05-18 MED ORDER — DEXAMETHASONE 0.4 MG OP INST
VAGINAL_INSERT | OPHTHALMIC | Status: DC | PRN
Start: 2024-05-18 — End: 2024-05-18
  Administered 2024-05-18: .4 mg via OPHTHALMIC

## 2024-05-18 MED ORDER — SODIUM CHLORIDE 0.9% FLUSH
INTRAVENOUS | Status: DC | PRN
Start: 2024-05-18 — End: 2024-05-18
  Administered 2024-05-18: 5 mL via INTRAVENOUS

## 2024-05-18 MED ORDER — BSS IO SOLN
INTRAOCULAR | Status: DC | PRN
  Administered 2024-05-18: 15 mL via INTRAOCULAR

## 2024-05-18 MED ORDER — PHENYLEPHRINE-KETOROLAC 1-0.3 % IO SOLN
INTRAOCULAR | Status: DC | PRN
  Administered 2024-05-18: 500 mL via OPHTHALMIC

## 2024-05-18 MED ORDER — TROPICAMIDE 1 % OP SOLN
1.0000 [drp] | OPHTHALMIC | Status: AC | PRN
  Administered 2024-05-18 (×3): 1 [drp] via OPHTHALMIC

## 2024-05-18 MED ORDER — MOXIFLOXACIN HCL 5 MG/ML IO SOLN
INTRAOCULAR | Status: DC | PRN
Start: 2024-05-18 — End: 2024-05-18
  Administered 2024-05-18: .2 mL via INTRACAMERAL

## 2024-05-18 MED ORDER — LIDOCAINE HCL (PF) 1 % IJ SOLN
INTRAMUSCULAR | Status: DC | PRN
Start: 2024-05-18 — End: 2024-05-18
  Administered 2024-05-18: 1 mL

## 2024-05-18 MED ORDER — PHENYLEPHRINE HCL 2.5 % OP SOLN
1.0000 [drp] | OPHTHALMIC | Status: AC | PRN
  Administered 2024-05-18 (×3): 1 [drp] via OPHTHALMIC

## 2024-05-18 MED ORDER — MIDAZOLAM HCL 2 MG/2ML IJ SOLN
INTRAMUSCULAR | Status: DC | PRN
Start: 2024-05-18 — End: 2024-05-18
  Administered 2024-05-18: 2 mg via INTRAVENOUS

## 2024-05-18 MED ORDER — SIGHTPATH DOSE#1 NA HYALUR & NA CHOND-NA HYALUR IO KIT
PACK | INTRAOCULAR | Status: DC | PRN
Start: 2024-05-18 — End: 2024-05-18
  Administered 2024-05-18: 1 via OPHTHALMIC

## 2024-05-18 MED ORDER — DEXAMETHASONE 0.4 MG OP INST
VAGINAL_INSERT | OPHTHALMIC | Status: AC
  Filled 2024-05-18: qty 1

## 2024-05-18 MED ORDER — LIDOCAINE HCL 3.5 % OP GEL
1.0000 | Freq: Once | OPHTHALMIC | Status: AC
  Administered 2024-05-18: 1 via OPHTHALMIC

## 2024-05-18 MED ORDER — TETRACAINE HCL 0.5 % OP SOLN
1.0000 [drp] | OPHTHALMIC | Status: AC | PRN
  Administered 2024-05-18 (×3): 1 [drp] via OPHTHALMIC

## 2024-05-18 MED ORDER — MIDAZOLAM HCL 2 MG/2ML IJ SOLN
INTRAMUSCULAR | Status: AC
  Filled 2024-05-18: qty 2

## 2024-05-18 SURGICAL SUPPLY — 14 items
CATARACT SUITE SIGHTPATH (MISCELLANEOUS) ×1 IMPLANT
CLOTH BEACON ORANGE TIMEOUT ST (SAFETY) ×1 IMPLANT
DRSG TEGADERM 4X4.75 (GAUZE/BANDAGES/DRESSINGS) ×1 IMPLANT
EYE SHIELD UNIVERSAL CLEAR (GAUZE/BANDAGES/DRESSINGS) IMPLANT
FEE CATARACT SUITE SIGHTPATH (MISCELLANEOUS) ×1 IMPLANT
GLOVE BIOGEL PI IND STRL 7.0 (GLOVE) ×2 IMPLANT
LENS IOL TECNIS EYHANCE 24.5 (Intraocular Lens) IMPLANT
NDL HYPO 18GX1.5 BLUNT FILL (NEEDLE) ×1 IMPLANT
NEEDLE HYPO 18GX1.5 BLUNT FILL (NEEDLE) ×1 IMPLANT
PAD ARMBOARD POSITIONER FOAM (MISCELLANEOUS) ×1 IMPLANT
POSITIONER HEAD 8X9X4 ADT (SOFTGOODS) ×1 IMPLANT
SYR TB 1ML LL NO SAFETY (SYRINGE) ×1 IMPLANT
TAPE SURG TRANSPORE 1 IN (GAUZE/BANDAGES/DRESSINGS) IMPLANT
WATER STERILE IRR 250ML POUR (IV SOLUTION) ×1 IMPLANT

## 2024-05-18 NOTE — Interval H&P Note (Signed)
 History and Physical Interval Note:  05/18/2024 11:06 AM  The H and P was reviewed and updated. The patient was examined.  No changes were found after exam.  The surgical eye was marked.   Kelly Fox

## 2024-05-18 NOTE — Op Note (Signed)
 Date of procedure: 05/18/24  Pre-operative diagnosis: Visually significant age-related nuclear cataract, Left Eye (H25.12)  Post-operative diagnosis: Visually significant age-related nuclear cataract, Left Eye H25.12; Ocular Pain and Inflammation, Left eye H57.12  Procedure: Removal of cataract via phacoemulsification and insertion of intra-ocular lens J&J DIB00 +24.5 D into the capsular bag of the Left Eye, Dextenza  Implantation into left lower punctum CPT [phone]  Attending surgeon: Marsa JINNY Cleverly, MD  Anesthesia: MAC, Topical Akten  Complications: None  Estimated Blood Loss: <72mL (minimal)  Specimens: None  Implants:  Implant Name Type Inv. Item Serial No. Manufacturer Lot No. LRB No. Used Action  LENS IOL TECNIS EYHANCE 24.5 - [id number] Intraocular Lens LENS IOL TECNIS EYHANCE 24.5 [id number] SIGHT[employer]  Left 1 Implanted    Indications:  Visually significant age-related cataract, Left Eye  Procedure:  The patient was seen and identified in the pre-operative area. The operative eye was identified and dilated.  The operative eye was marked.  Topical anesthesia was administered to the operative eye.     The patient was then to the operative suite and placed in the supine position.  A timeout was performed confirming the patient, procedure to be performed, and all other relevant information.   The patient's face was prepped and draped in the usual fashion for intra-ocular surgery.  A lid speculum was placed into the operative eye and the surgical microscope moved into place and focused.  An inferotemporal paracentesis was created using a 20 gauge paracentesis blade.  BSS mixed with Omidria, followed by 1% lidocaine  was injected into the anterior chamber.  Viscoelastic was injected into the anterior chamber.  A temporal clear-corneal main wound incision was created using a 2.66mm microkeratome.  A continuous curvilinear capsulorrhexis was initiated using an irrigating cystitome and  completed using capsulorrhexis forceps.  Hydrodissection and hydrodeliniation were performed.  Viscoelastic was injected into the anterior chamber.  A phacoemulsification handpiece and a chopper as a second instrument were used to remove the nucleus and epinucleus. The irrigation/aspiration handpiece was used to remove any remaining cortical material.   The capsular bag was reinflated with viscoelastic, checked, and found to be intact.  The intraocular lens was inserted into the capsular bag.  The irrigation/aspiration handpiece was used to remove any remaining viscoelastic.  The clear corneal wound and paracentesis wounds were then hydrated and checked with Weck-Cels to be watertight. Moxifloxacin was instilled into the anterior chamber.  The lid-speculum and drape were removed. The lower punctum was dilated, and the dextenza  implant was inserted into it. The patient's face was cleaned with a wet and dry 4x4.  A clear shield was taped over the eye. The patient was taken to the post-operative care unit in good condition, having tolerated the procedure well.  Post-Op Instructions: The patient will follow up at [facility] for a same day post-operative evaluation and will receive all other orders and instructions.

## 2024-05-18 NOTE — Anesthesia Procedure Notes (Signed)
 Date/Time: 05/18/2024 11:13 AM  Performed by: Para Jerelene CROME, CRNAOxygen Delivery Method: Nasal cannula

## 2024-05-18 NOTE — Anesthesia Preprocedure Evaluation (Addendum)
 Anesthesia Evaluation  Patient identified by MRN, date of birth, ID band Patient awake    Reviewed: Allergy & Precautions, H&P , NPO status , Patient's Chart, lab work & pertinent test results, reviewed documented beta blocker date and time   Airway Mallampati: II  TM Distance: >3 FB Neck ROM: full    Dental no notable dental hx.    Pulmonary asthma    Pulmonary exam normal breath sounds clear to auscultation       Cardiovascular Exercise Tolerance: Good hypertension, negative cardio ROS  Rhythm:regular Rate:Normal     Neuro/Psych negative neurological ROS  negative psych ROS   GI/Hepatic negative GI ROS, Neg liver ROS,,,  Endo/Other  negative endocrine ROS    Renal/GU negative Renal ROS  negative genitourinary   Musculoskeletal   Abdominal   Peds  Hematology negative hematology ROS (+)   Anesthesia Other Findings   Reproductive/Obstetrics negative OB ROS                              Anesthesia Physical Anesthesia Plan  ASA: 2  Anesthesia Plan: MAC   Post-op Pain Management:    Induction:   PONV Risk Score and Plan:   Airway Management Planned:   Additional Equipment:   Intra-op Plan:   Post-operative Plan:   Informed Consent: I have reviewed the patients History and Physical, chart, labs and discussed the procedure including the risks, benefits and alternatives for the proposed anesthesia with the patient or authorized representative who has indicated his/her understanding and acceptance.     Dental Advisory Given  Plan Discussed with: CRNA  Anesthesia Plan Comments:         Anesthesia Quick Evaluation

## 2024-05-18 NOTE — Discharge Instructions (Signed)
 Please discharge patient when stable, will follow up today with Dr. Ilsa Iha at the [facility] office immediately following discharge.  Leave shield in place until visit.  All paperwork with discharge instructions will be given at the office.  [facility] Address:  [address]  [place], [place] [address]  Dr. Chaya Jan Phone: [phone]

## 2024-05-18 NOTE — Transfer of Care (Signed)
 Immediate Anesthesia Transfer of Care Note  Patient: Kelly Fox  Procedure(s) Performed: PHACOEMULSIFICATION, CATARACT, WITH IOL INSERTION (Left: Eye) INSERTION, STENT, DRUG-ELUTING, LACRIMAL CANALICULUS (Left: Eye)  Patient Location: PACU  Anesthesia Type:MAC  Level of Consciousness: awake, alert , oriented, and patient cooperative  Airway & Oxygen Therapy: Patient Spontanous Breathing  Post-op Assessment: Report given to RN, Post -op Vital signs reviewed and stable, and Patient moving all extremities X 4  Post vital signs: Reviewed and stable  Last Vitals:  Vitals Value Taken Time  BP 167/102   Temp 97   Pulse 75   Resp 10   SpO2 100     Last Pain:  Vitals:   05/18/24 0946  TempSrc: Oral  PainSc: 0-No pain         Complications: No notable events documented.

## 2024-05-19 NOTE — Anesthesia Postprocedure Evaluation (Signed)
 Anesthesia Post Note  Patient: Kelly Fox  Procedure(s) Performed: PHACOEMULSIFICATION, CATARACT, WITH IOL INSERTION (Left: Eye) INSERTION, STENT, DRUG-ELUTING, LACRIMAL CANALICULUS (Left: Eye)  Patient location during evaluation: Phase II Anesthesia Type: MAC Level of consciousness: awake Pain management: pain level controlled Vital Signs Assessment: post-procedure vital signs reviewed and stable Respiratory status: spontaneous breathing and respiratory function stable Cardiovascular status: blood pressure returned to baseline and stable Postop Assessment: no headache and no apparent nausea or vomiting Anesthetic complications: no Comments: Late entry   No notable events documented.   Last Vitals:  Vitals:   05/18/24 0946 05/18/24 1108  BP: (!) 173/81 (!) 152/81  Pulse: 66 75  Resp: 18 16  Temp: 36.8 C 36.5 C  SpO2: 100% 100%    Last Pain:  Vitals:   05/18/24 1108  TempSrc: Oral  PainSc:                  [name]

## 2024-05-20 ENCOUNTER — Other Ambulatory Visit: Payer: Self-pay | Admitting: Orthopedic Surgery

## 2024-05-20 DIAGNOSIS — M238X1 Other internal derangements of right knee: Secondary | ICD-10-CM

## 2024-05-28 ENCOUNTER — Encounter (HOSPITAL_COMMUNITY)
Admission: RE | Admit: 2024-05-28 | Discharge: 2024-05-28 | Disposition: A | Discharge status: Home / Self Care | Source: Ambulatory Visit | Attending: Optometry | Admitting: Optometry

## 2024-05-28 ENCOUNTER — Ambulatory Visit (INDEPENDENT_AMBULATORY_CARE_PROVIDER_SITE_OTHER): Admitting: Orthopedic Surgery

## 2024-05-28 ENCOUNTER — Encounter: Payer: Self-pay | Admitting: Orthopedic Surgery

## 2024-05-28 DIAGNOSIS — M5410 Radiculopathy, site unspecified: Secondary | ICD-10-CM

## 2024-05-28 DIAGNOSIS — M25861 Other specified joint disorders, right knee: Secondary | ICD-10-CM

## 2024-05-28 MED ORDER — NORTRIPTYLINE HCL 10 MG PO CAPS: 10.0000 mg | ORAL_CAPSULE | Freq: Every day | ORAL | 0 refills | Status: DC

## 2024-05-28 NOTE — Progress Notes (Signed)
   There were no vitals taken for this visit.  There is no height or weight on file to calculate BMI.  Chief Complaint  Patient presents with   Post-op Follow-up    Encounter Diagnosis  Name Primary?   Patellar clunk syndrome of right knee s/p arthroscopy 04/24/24 Yes    DOI/DOS/ Date: 04/24/24  Improved

## 2024-05-28 NOTE — Progress Notes (Signed)
  Postop appointment Chief Complaint  Patient presents with   Post-op Follow-up    Encounter Diagnoses  Name Primary?   Patellar clunk syndrome of right knee s/p arthroscopy 04/24/24    Radicular leg pain Yes    DOI/DOS/ Date: 04/24/24  Improved  Kelly Fox is doing well no evidence of patellar clunk smooth range of motion no clunking noted in right knee still having lateral leg pain occasional lateral hip pain  I think she is having some radicular symptoms recommend Pamelor  at night she did not tolerate gabapentin   Right knee follow-up as needed

## 2024-05-29 DIAGNOSIS — H2511 Age-related nuclear cataract, right eye: Secondary | ICD-10-CM | POA: Diagnosis not present

## 2024-05-30 NOTE — H&P (Signed)
 Surgical History & Physical  Patient Name: Kelly Fox  DOB: [birth year]  Surgery: Cataract extraction with intraocular lens implant phacoemulsification; Right Eye Surgeon: Marsa Cleverly MD Surgery Date: 06/01/2024 Pre-Op Date: 05/23/2024  HPI: A 2 Yr. old female patient 1. The patient is returning after cataract surgery. The left eye is affected. Status post cataract surgery, which began 5 days ago: Since the last visit, the affected area is doing well. The patient's vision is improved. The condition's severity is constant. Patient is following medication instructions. 2. The patient is returning for a cataract follow-up of the right eye. Since the last visit, the affected area is tolerating. The patient's vision is blurry. The condition's severity is constant. Patient is not taking medications.  Medical History: Cataracts  Arthritis  Review of Systems Allergic/Immunologic Seasonal Allergies All recorded systems are negative except as noted above.  Social Never smoked  Medication Prednisolone-moxiflox-bromfen,  Tramadol , Gabapentin   Sx/Procedures Phaco c IOL OS - Dextenza ,  Knee replacement x2  Drug Allergies  contrast dye   History & Physical: Heent: cataract NECK: supple without bruits LUNGS: lungs clear to auscultation CV: regular rate and rhythm Abdomen: soft and non-tender  Impression & Plan: Assessment: 1.  PSEUDOPHAKIA-POST OP (PRESENCE OF INTRAOCULAR LENS); Left Eye (Z98.42) 2.  PSEUDOPHAKIA (PRESENCE OF INTRAOCULAR LENS) (Z96.1) 3.  CATARACT NUCLEAR SCLEROSIS AGE RELATED; Both Eyes (H25.13)  Plan: 1.  POD#5 Patient doing well. Continue with eye drops as directed. Reviewed all post-op precautions. Call with increased redness, swelling, pain or loss of vision. Avoid swimming pools and hot tubs for 1 week. Return for post op as scheduled.  2.   3.  Cataracts are visually significant and account for the patient's complaints. Discussed all risks, benefits,  procedures and recovery, including infection, loss of vision and eye, need for glasses after surgery or additional procedures. Patient understands changing glasses will not improve vision. Patient indicated understanding of procedure. All questions answered. Patient desires to have surgery, recommend phacoemulsification with intraocular lens. Patient to have preliminary testing necessary ([employer]os/IOL Master, Mac OCT, TOPO) Educational materials provided:Cataract.  Plan: - Proceed with cataract surgery OD when ready - Plan for best distance target with DIB00 - No DM, no fuchs, no prior eye surgery - good dilation - Dextenza  if available

## 2024-06-01 ENCOUNTER — Other Ambulatory Visit: Payer: Self-pay

## 2024-06-01 ENCOUNTER — Ambulatory Visit (HOSPITAL_COMMUNITY)
Admission: RE | Admit: 2024-06-01 | Discharge: 2024-06-01 | Disposition: A | Discharge status: Home / Self Care | Attending: Optometry | Admitting: Optometry

## 2024-06-01 ENCOUNTER — Ambulatory Visit (HOSPITAL_BASED_OUTPATIENT_CLINIC_OR_DEPARTMENT_OTHER): Admitting: Certified Registered"

## 2024-06-01 ENCOUNTER — Encounter (HOSPITAL_COMMUNITY): Payer: Self-pay | Admitting: Optometry

## 2024-06-01 ENCOUNTER — Ambulatory Visit (HOSPITAL_COMMUNITY): Admitting: Certified Registered"

## 2024-06-01 ENCOUNTER — Encounter (HOSPITAL_COMMUNITY)
Admission: RE | Disposition: A | Discharge status: Home / Self Care | Payer: Self-pay | Source: Home / Self Care | Attending: Optometry

## 2024-06-01 DIAGNOSIS — H5711 Ocular pain, right eye: Secondary | ICD-10-CM | POA: Diagnosis not present

## 2024-06-01 DIAGNOSIS — H2511 Age-related nuclear cataract, right eye: Secondary | ICD-10-CM | POA: Diagnosis not present

## 2024-06-01 DIAGNOSIS — I10 Essential (primary) hypertension: Secondary | ICD-10-CM | POA: Diagnosis not present

## 2024-06-01 DIAGNOSIS — Z961 Presence of intraocular lens: Secondary | ICD-10-CM | POA: Diagnosis not present

## 2024-06-01 DIAGNOSIS — Z9842 Cataract extraction status, left eye: Secondary | ICD-10-CM | POA: Diagnosis not present

## 2024-06-01 HISTORY — PX: CATARACT EXTRACTION W/PHACO: SHX586

## 2024-06-01 HISTORY — PX: INSERTION, STENT, DRUG-ELUTING, LACRIMAL CANALICULUS: SHX7453

## 2024-06-01 SURGERY — PHACOEMULSIFICATION, CATARACT, WITH IOL INSERTION
Anesthesia: Monitor Anesthesia Care | Site: Eye | Laterality: Right

## 2024-06-01 MED ORDER — LIDOCAINE HCL 3.5 % OP GEL
1.0000 | Freq: Once | OPHTHALMIC | Status: AC
  Administered 2024-06-01: 1 via OPHTHALMIC

## 2024-06-01 MED ORDER — LIDOCAINE HCL (PF) 1 % IJ SOLN
INTRAMUSCULAR | Status: DC | PRN
  Administered 2024-06-01: 2 mL

## 2024-06-01 MED ORDER — DEXAMETHASONE 0.4 MG OP INST
VAGINAL_INSERT | OPHTHALMIC | Status: DC | PRN
  Administered 2024-06-01: .4 mg via OPHTHALMIC

## 2024-06-01 MED ORDER — DEXAMETHASONE 0.4 MG OP INST
VAGINAL_INSERT | OPHTHALMIC | Status: AC
  Filled 2024-06-01: qty 1

## 2024-06-01 MED ORDER — TETRACAINE HCL 0.5 % OP SOLN
1.0000 [drp] | OPHTHALMIC | Status: AC
  Administered 2024-06-01 (×3): 1 [drp] via OPHTHALMIC

## 2024-06-01 MED ORDER — POVIDONE-IODINE 5 % OP SOLN
OPHTHALMIC | Status: DC | PRN
  Administered 2024-06-01: 1 via OPHTHALMIC

## 2024-06-01 MED ORDER — MIDAZOLAM HCL 2 MG/2ML IJ SOLN
INTRAMUSCULAR | Status: AC
  Filled 2024-06-01: qty 2

## 2024-06-01 MED ORDER — SIGHTPATH DOSE#1 NA HYALUR & NA CHOND-NA HYALUR IO KIT
PACK | INTRAOCULAR | Status: DC | PRN
  Administered 2024-06-01: 1 via OPHTHALMIC

## 2024-06-01 MED ORDER — BSS IO SOLN
INTRAOCULAR | Status: DC | PRN
  Administered 2024-06-01: 15 mL via INTRAOCULAR

## 2024-06-01 MED ORDER — TROPICAMIDE 1 % OP SOLN
1.0000 [drp] | OPHTHALMIC | Status: AC
  Administered 2024-06-01 (×3): 1 [drp] via OPHTHALMIC

## 2024-06-01 MED ORDER — STERILE WATER FOR IRRIGATION IR SOLN
Status: DC | PRN
Start: 2024-06-01 — End: 2024-06-01
  Administered 2024-06-01: 250 mL

## 2024-06-01 MED ORDER — MIDAZOLAM HCL 2 MG/2ML IJ SOLN
INTRAMUSCULAR | Status: DC | PRN
Start: 2024-06-01 — End: 2024-06-01
  Administered 2024-06-01: 2 mg via INTRAVENOUS

## 2024-06-01 MED ORDER — SODIUM CHLORIDE 0.9% FLUSH
INTRAVENOUS | Status: DC | PRN
  Administered 2024-06-01: 5 mL via INTRAVENOUS

## 2024-06-01 MED ORDER — PHENYLEPHRINE-KETOROLAC 1-0.3 % IO SOLN
INTRAOCULAR | Status: DC | PRN
  Administered 2024-06-01: 500 mL via OPHTHALMIC

## 2024-06-01 MED ORDER — PHENYLEPHRINE HCL 2.5 % OP SOLN
1.0000 [drp] | OPHTHALMIC | Status: AC
  Administered 2024-06-01 (×3): 1 [drp] via OPHTHALMIC

## 2024-06-01 MED ORDER — MOXIFLOXACIN HCL 5 MG/ML IO SOLN
INTRAOCULAR | Status: DC | PRN
  Administered 2024-06-01: .2 mL via INTRACAMERAL

## 2024-06-01 SURGICAL SUPPLY — 13 items
CLOTH BEACON ORANGE TIMEOUT ST (SAFETY) ×2 IMPLANT
DRSG TEGADERM 4X4.75 (GAUZE/BANDAGES/DRESSINGS) ×2 IMPLANT
EYE SHIELD UNIVERSAL CLEAR (GAUZE/BANDAGES/DRESSINGS) IMPLANT
FEE CATARACT SUITE SIGHTPATH (MISCELLANEOUS) ×2 IMPLANT
GLOVE BIOGEL PI IND STRL 7.0 (GLOVE) ×4 IMPLANT
LENS IOL TECNIS EYHANCE 26.0 (Intraocular Lens) IMPLANT
NDL HYPO 18GX1.5 BLUNT FILL (NEEDLE) ×2 IMPLANT
NEEDLE HYPO 18GX1.5 BLUNT FILL (NEEDLE) ×2 IMPLANT
PAD ARMBOARD POSITIONER FOAM (MISCELLANEOUS) ×2 IMPLANT
POSITIONER HEAD 8X9X4 ADT (SOFTGOODS) ×2 IMPLANT
SYR TB 1ML LL NO SAFETY (SYRINGE) ×2 IMPLANT
TAPE SURG TRANSPORE 1 IN (GAUZE/BANDAGES/DRESSINGS) IMPLANT
WATER STERILE IRR 250ML POUR (IV SOLUTION) ×2 IMPLANT

## 2024-06-01 NOTE — Transfer of Care (Addendum)
 Immediate Anesthesia Transfer of Care Note  Patient: Kelly Fox  Procedure(s) Performed: PHACOEMULSIFICATION, CATARACT, WITH IOL INSERTION (Right: Eye) INSERTION, STENT, DRUG-ELUTING, LACRIMAL CANALICULUS (Right)  Patient Location: Short Stay  Anesthesia Type:MAC  Level of Consciousness: awake and patient cooperative  Airway & Oxygen Therapy: Patient Spontanous Breathing  Post-op Assessment: Report given to RN and Post -op Vital signs reviewed and stable  Post vital signs: Reviewed and stable  Last Vitals:  Vitals Value Taken Time  BP 141/82 06/01/24   0815  Temp 36.9 06/01/24   0815  Pulse 76 06/01/24   0815  Resp 16 06/01/24   0815  SpO2 100% 06/01/24   0815    Last Pain:  Vitals:   06/01/24 0713  TempSrc: Oral  PainSc: 0-No pain         Complications: No notable events documented.

## 2024-06-01 NOTE — Discharge Instructions (Signed)
 Please discharge patient when stable, will follow up today with Dr. Ilsa Iha at the [facility] office immediately following discharge.  Leave shield in place until visit.  All paperwork with discharge instructions will be given at the office.  [facility] Address:  [address]  [place], [place] [address]  Dr. Chaya Jan Phone: [phone]

## 2024-06-01 NOTE — Interval H&P Note (Signed)
 History and Physical Interval Note:  06/01/2024 7:48 AM  The H and P was reviewed and updated. The patient was examined.  No changes were found after exam.  The surgical eye was marked.   Vick Filter

## 2024-06-01 NOTE — Op Note (Signed)
 Date of procedure: 06/01/24  Pre-operative diagnosis: Visually significant age-related nuclear cataract, Right Eye (H25.11)  Post-operative diagnosis: Visually significant age-related nuclear cataract, Right Eye H25.11; Ocular Pain and Inflammation, Right eye H57.11  Procedure: Removal of cataract via phacoemulsification and insertion of intra-ocular lens J&J DIBOO +26.0D into the capsular bag of the Right Eye, Dextenza  Implantation into right lower punctum CPT [phone]  Attending surgeon: Marsa Cleverly, MD  Anesthesia: MAC, Topical Akten   Complications: None  Estimated Blood Loss: <58mL (minimal)  Specimens: None  Implants:  Implant Name Type Inv. Item Serial No. Manufacturer Lot No. LRB No. Used Action  LENS IOL TECNIS EYHANCE 26.0 - [id number] Intraocular Lens LENS IOL TECNIS EYHANCE 26.0 [id number] [employer]  Right 1 Implanted    Indications:  Visually significant age-related cataract, Right Eye  Procedure:  The patient was seen and identified in the pre-operative area. The operative eye was identified and dilated.  The operative eye was marked.  Topical anesthesia was administered to the operative eye.     The patient was then to the operative suite and placed in the supine position.  A timeout was performed confirming the patient, procedure to be performed, and all other relevant information.   The patient's face was prepped and draped in the usual fashion for intra-ocular surgery.  A lid speculum was placed into the operative eye and the surgical microscope moved into place and focused.  A superotemporal paracentesis was created using a 20 gauge paracentesis blade.  BSS mixed with [place] , followed by 1% lidocaine  was injected into the anterior chamber.  Viscoelastic was injected into the anterior chamber.  A temporal clear-corneal main wound incision was created using a 2.64mm microkeratome.  A continuous curvilinear capsulorrhexis was initiated using an irrigating cystitome and  completed using capsulorrhexis forceps.  Hydrodissection and hydrodeliniation were performed.  Viscoelastic was injected into the anterior chamber.  A phacoemulsification handpiece and a chopper as a second instrument were used to remove the nucleus and epinucleus. The irrigation/aspiration handpiece was used to remove any remaining cortical material.   The capsular bag was reinflated with viscoelastic, checked, and found to be intact.  The intraocular lens was inserted into the capsular bag.  The irrigation/aspiration handpiece was used to remove any remaining viscoelastic.  The clear corneal wound and paracentesis wounds were then hydrated and checked with Weck-Cels to be watertight. Moxifloxacin  was instilled into the anterior chamber.  The lid-speculum and drape were removed. The lower punctum was dilated, and the dextenza  implant was inserted into it. The patient's face was cleaned with a wet and dry 4x4. A clear shield was taped over the eye. The patient was taken to the post-operative care unit in good condition, having tolerated the procedure well.  Post-Op Instructions: The patient will follow up at [facility] for a same day post-operative evaluation and will receive all other orders and instructions.

## 2024-06-01 NOTE — Anesthesia Preprocedure Evaluation (Addendum)
 Anesthesia Evaluation  Patient identified by MRN, date of birth, ID band Patient awake    Reviewed: Allergy & Precautions, H&P , NPO status , Patient's Chart, lab work & pertinent test results  Airway Mallampati: II  TM Distance: >3 FB Neck ROM: Full    Dental no notable dental hx.    Pulmonary neg pulmonary ROS   Pulmonary exam normal breath sounds clear to auscultation       Cardiovascular negative cardio ROS Normal cardiovascular exam Rhythm:Regular Rate:Normal     Neuro/Psych negative neurological ROS  negative psych ROS   GI/Hepatic negative GI ROS, Neg liver ROS,,,  Endo/Other  negative endocrine ROS    Renal/GU negative Renal ROS  negative genitourinary   Musculoskeletal negative musculoskeletal ROS (+)    Abdominal   Peds negative pediatric ROS (+)  Hematology negative hematology ROS (+)   Anesthesia Other Findings   Reproductive/Obstetrics negative OB ROS                              Anesthesia Physical Anesthesia Plan  ASA: 2  Anesthesia Plan: MAC   Post-op Pain Management:    Induction: Intravenous  PONV Risk Score and Plan:   Airway Management Planned: Nasal Cannula  Additional Equipment:   Intra-op Plan:   Post-operative Plan:   Informed Consent: I have reviewed the patients History and Physical, chart, labs and discussed the procedure including the risks, benefits and alternatives for the proposed anesthesia with the patient or authorized representative who has indicated his/her understanding and acceptance.     Dental advisory given  Plan Discussed with: CRNA  Anesthesia Plan Comments:          Anesthesia Quick Evaluation

## 2024-06-01 NOTE — Anesthesia Postprocedure Evaluation (Signed)
 Anesthesia Post Note  Patient: Ladan Vanderzanden  Procedure(s) Performed: PHACOEMULSIFICATION, CATARACT, WITH IOL INSERTION (Right: Eye) INSERTION, STENT, DRUG-ELUTING, LACRIMAL CANALICULUS (Right)  Patient location during evaluation: PACU Anesthesia Type: MAC Level of consciousness: awake and alert Pain management: pain level controlled Vital Signs Assessment: post-procedure vital signs reviewed and stable Respiratory status: spontaneous breathing, nonlabored ventilation, respiratory function stable and patient connected to nasal cannula oxygen Cardiovascular status: stable and blood pressure returned to baseline Postop Assessment: no apparent nausea or vomiting Anesthetic complications: no   No notable events documented.   Last Vitals:  Vitals:   06/01/24 0713 06/01/24 0815  BP: (!) 165/76 (!) 141/82  Pulse: 71 76  Resp: 13 16  Temp: (!) 36.4 C 36.9 C  SpO2: 100% 100%    Last Pain:  Vitals:   06/01/24 0815  TempSrc: Oral  PainSc: 0-No pain                 Andrea Limes

## 2024-06-01 NOTE — Anesthesia Procedure Notes (Signed)
 Date/Time: 06/01/2024 7:55 AM  Performed by: Para Jerelene CROME, CRNAOxygen Delivery Method: Nasal cannula

## 2024-06-03 ENCOUNTER — Other Ambulatory Visit: Payer: Self-pay | Admitting: Orthopedic Surgery

## 2024-06-03 DIAGNOSIS — M238X1 Other internal derangements of right knee: Secondary | ICD-10-CM

## 2024-06-04 ENCOUNTER — Encounter (HOSPITAL_COMMUNITY): Payer: Self-pay | Admitting: Optometry

## 2024-06-21 ENCOUNTER — Other Ambulatory Visit: Payer: Self-pay | Admitting: Orthopedic Surgery

## 2024-06-21 DIAGNOSIS — M5410 Radiculopathy, site unspecified: Secondary | ICD-10-CM

## 2024-07-08 ENCOUNTER — Other Ambulatory Visit: Payer: Self-pay | Admitting: Orthopedic Surgery

## 2024-07-18 ENCOUNTER — Other Ambulatory Visit: Payer: Self-pay | Admitting: Orthopedic Surgery

## 2024-07-18 DIAGNOSIS — M545 Low back pain, unspecified: Secondary | ICD-10-CM

## 2024-07-18 DIAGNOSIS — L820 Inflamed seborrheic keratosis: Secondary | ICD-10-CM | POA: Diagnosis not present

## 2024-07-18 DIAGNOSIS — M238X1 Other internal derangements of right knee: Secondary | ICD-10-CM

## 2024-08-01 ENCOUNTER — Other Ambulatory Visit: Payer: Self-pay | Admitting: Orthopedic Surgery

## 2024-08-31 ENCOUNTER — Other Ambulatory Visit: Payer: Self-pay | Admitting: Orthopedic Surgery

## 2024-08-31 DIAGNOSIS — M5410 Radiculopathy, site unspecified: Secondary | ICD-10-CM

## 2024-08-31 DIAGNOSIS — M545 Low back pain, unspecified: Secondary | ICD-10-CM

## 2024-10-03 ENCOUNTER — Other Ambulatory Visit: Payer: Self-pay | Admitting: Orthopedic Surgery

## 2024-10-03 DIAGNOSIS — M545 Low back pain, unspecified: Secondary | ICD-10-CM

## 2024-10-03 DIAGNOSIS — M5410 Radiculopathy, site unspecified: Secondary | ICD-10-CM

## 2024-10-12 ENCOUNTER — Other Ambulatory Visit: Payer: Self-pay | Admitting: Orthopedic Surgery

## 2024-10-12 DIAGNOSIS — M545 Low back pain, unspecified: Secondary | ICD-10-CM

## 2024-10-31 ENCOUNTER — Other Ambulatory Visit: Payer: Self-pay | Admitting: Orthopedic Surgery

## 2024-10-31 DIAGNOSIS — M5410 Radiculopathy, site unspecified: Secondary | ICD-10-CM

## 2024-10-31 DIAGNOSIS — M545 Low back pain, unspecified: Secondary | ICD-10-CM

## 2024-11-08 ENCOUNTER — Other Ambulatory Visit: Payer: Self-pay | Admitting: Orthopedic Surgery

## 2024-11-08 DIAGNOSIS — M238X1 Other internal derangements of right knee: Secondary | ICD-10-CM

## 2024-11-30 ENCOUNTER — Other Ambulatory Visit: Payer: Self-pay | Admitting: Orthopedic Surgery

## 2024-11-30 DIAGNOSIS — M545 Low back pain, unspecified: Secondary | ICD-10-CM

## 2024-11-30 DIAGNOSIS — M5410 Radiculopathy, site unspecified: Secondary | ICD-10-CM
